# Patient Record
Sex: Female | Born: 1955 | Race: White | Hispanic: No | Marital: Married | Smoking: Never smoker
Health system: Southern US, Community
[De-identification: ages and names within clinical notes are randomized; demographics above are authoritative.]

## PROBLEM LIST (undated history)

## (undated) DIAGNOSIS — M549 Dorsalgia, unspecified: Secondary | ICD-10-CM

## (undated) DIAGNOSIS — R3129 Other microscopic hematuria: Secondary | ICD-10-CM

## (undated) DIAGNOSIS — E7800 Pure hypercholesterolemia, unspecified: Secondary | ICD-10-CM

## (undated) DIAGNOSIS — M2540 Effusion, unspecified joint: Secondary | ICD-10-CM

## (undated) DIAGNOSIS — E039 Hypothyroidism, unspecified: Secondary | ICD-10-CM

## (undated) DIAGNOSIS — F419 Anxiety disorder, unspecified: Secondary | ICD-10-CM

## (undated) DIAGNOSIS — M2550 Pain in unspecified joint: Secondary | ICD-10-CM

## (undated) DIAGNOSIS — I10 Essential (primary) hypertension: Secondary | ICD-10-CM

## (undated) DIAGNOSIS — M1990 Unspecified osteoarthritis, unspecified site: Secondary | ICD-10-CM

## (undated) DIAGNOSIS — F329 Major depressive disorder, single episode, unspecified: Secondary | ICD-10-CM

## (undated) DIAGNOSIS — K8020 Calculus of gallbladder without cholecystitis without obstruction: Secondary | ICD-10-CM

## (undated) DIAGNOSIS — G8929 Other chronic pain: Secondary | ICD-10-CM

## (undated) DIAGNOSIS — Z860100 Personal history of colon polyps, unspecified: Secondary | ICD-10-CM

## (undated) DIAGNOSIS — Z8619 Personal history of other infectious and parasitic diseases: Secondary | ICD-10-CM

## (undated) DIAGNOSIS — Z8489 Family history of other specified conditions: Secondary | ICD-10-CM

## (undated) DIAGNOSIS — F32A Depression, unspecified: Secondary | ICD-10-CM

## (undated) HISTORY — PX: TONSILLECTOMY: SUR1361

## (undated) HISTORY — PX: CARPAL TUNNEL RELEASE: SHX101

## (undated) HISTORY — PX: ABDOMINAL HYSTERECTOMY: SHX81

## (undated) HISTORY — DX: Essential (primary) hypertension: I10

## (undated) HISTORY — DX: Anxiety disorder, unspecified: F41.9

## (undated) HISTORY — DX: Calculus of gallbladder without cholecystitis without obstruction: K80.20

## (undated) HISTORY — PX: KNEE ARTHROSCOPY: SUR90

## (undated) HISTORY — PX: COLONOSCOPY: SHX174

## (undated) HISTORY — PX: NASAL SINUS SURGERY: SHX719

## (undated) HISTORY — PX: OTHER SURGICAL HISTORY: SHX169

## (undated) HISTORY — PX: BREAST BIOPSY: SHX20

## (undated) HISTORY — PX: APPENDECTOMY: SHX54

## (undated) HISTORY — PX: BUNIONECTOMY: SHX129

## (undated) HISTORY — PX: CHOLECYSTECTOMY: SHX55

---

## 1996-12-27 HISTORY — PX: ABDOMINAL HYSTERECTOMY: SHX81

## 2000-12-27 HISTORY — PX: TONSILLECTOMY AND ADENOIDECTOMY: SUR1326

## 2015-07-31 ENCOUNTER — Emergency Department (HOSPITAL_COMMUNITY): Payer: 59

## 2015-07-31 ENCOUNTER — Emergency Department (HOSPITAL_COMMUNITY)
Admission: EM | Admit: 2015-07-31 | Discharge: 2015-07-31 | Disposition: A | Discharge status: Home / Self Care | Payer: 59 | Attending: Emergency Medicine | Admitting: Emergency Medicine

## 2015-07-31 ENCOUNTER — Encounter (HOSPITAL_COMMUNITY): Payer: Self-pay

## 2015-07-31 DIAGNOSIS — Y9389 Activity, other specified: Secondary | ICD-10-CM | POA: Diagnosis not present

## 2015-07-31 DIAGNOSIS — E780 Pure hypercholesterolemia: Secondary | ICD-10-CM | POA: Insufficient documentation

## 2015-07-31 DIAGNOSIS — Z79899 Other long term (current) drug therapy: Secondary | ICD-10-CM | POA: Insufficient documentation

## 2015-07-31 DIAGNOSIS — M545 Low back pain, unspecified: Secondary | ICD-10-CM

## 2015-07-31 DIAGNOSIS — M549 Dorsalgia, unspecified: Secondary | ICD-10-CM

## 2015-07-31 DIAGNOSIS — S3992XA Unspecified injury of lower back, initial encounter: Secondary | ICD-10-CM | POA: Diagnosis not present

## 2015-07-31 DIAGNOSIS — Y9289 Other specified places as the place of occurrence of the external cause: Secondary | ICD-10-CM | POA: Diagnosis not present

## 2015-07-31 DIAGNOSIS — Z87440 Personal history of urinary (tract) infections: Secondary | ICD-10-CM | POA: Diagnosis not present

## 2015-07-31 DIAGNOSIS — Y998 Other external cause status: Secondary | ICD-10-CM | POA: Insufficient documentation

## 2015-07-31 DIAGNOSIS — X58XXXA Exposure to other specified factors, initial encounter: Secondary | ICD-10-CM | POA: Insufficient documentation

## 2015-07-31 HISTORY — DX: Pure hypercholesterolemia, unspecified: E78.00

## 2015-07-31 LAB — URINALYSIS, ROUTINE W REFLEX MICROSCOPIC
Bilirubin Urine: NEGATIVE
Glucose, UA: NEGATIVE mg/dL
Ketones, ur: NEGATIVE mg/dL
Leukocytes, UA: NEGATIVE
Nitrite: NEGATIVE
PROTEIN: NEGATIVE mg/dL
Specific Gravity, Urine: >1.03 — ABNORMAL HIGH (ref 1.005–1.030)
Urobilinogen, UA: 0.2 mg/dL (ref 0.0–1.0)
pH: 5.5 (ref 5.0–8.0)

## 2015-07-31 LAB — URINE MICROSCOPIC-ADD ON

## 2015-07-31 MED ORDER — OXYCODONE-ACETAMINOPHEN 5-325 MG PO TABS
2.0000 | ORAL_TABLET | Freq: Once | ORAL | Status: AC
  Administered 2015-07-31: 2 via ORAL
  Filled 2015-07-31: qty 2

## 2015-07-31 MED ORDER — METHOCARBAMOL 500 MG PO TABS: 1000.0000 mg | ORAL_TABLET | Freq: Four times a day (QID) | ORAL | Status: DC | PRN

## 2015-07-31 MED ORDER — NAPROXEN 250 MG PO TABS: 250.0000 mg | ORAL_TABLET | Freq: Two times a day (BID) | ORAL | Status: DC | PRN

## 2015-07-31 MED ORDER — DIAZEPAM 5 MG PO TABS
5.0000 mg | ORAL_TABLET | Freq: Once | ORAL | Status: AC
  Administered 2015-07-31: 5 mg via ORAL
  Filled 2015-07-31: qty 1

## 2015-07-31 MED ORDER — HYDROCODONE-ACETAMINOPHEN 5-325 MG PO TABS: ORAL_TABLET | ORAL | Status: DC

## 2015-07-31 NOTE — ED Notes (Signed)
Pt states she bent over Monday and felt something pull in the right side of her back. States the pain is going into her right groin/flank area

## 2015-07-31 NOTE — Discharge Instructions (Signed)
°Emergency Department Resource Guide °1) Find a Doctor and Pay Out of Pocket °Although you won't have to find out who is covered by your insurance plan, it is a good idea to ask around and get recommendations. You will then need to call the office and see if the doctor you have chosen will accept you as a new patient and what types of options they offer for patients who are self-pay. Some doctors offer discounts or will set up payment plans for their patients who do not have insurance, but you will need to ask so you aren't surprised when you get to your appointment. ° °2) Contact Your Local Health Department °Not all health departments have doctors that can see patients for sick visits, but many do, so it is worth a call to see if yours does. If you don't know where your local health department is, you can check in your phone book. The CDC also has a tool to help you locate your state's health department, and many state websites also have listings of all of their local health departments. ° °3) Find a Walk-in Clinic °If your illness is not likely to be very severe or complicated, you may want to try a walk in clinic. These are popping up all over the country in pharmacies, drugstores, and shopping centers. They're usually staffed by nurse practitioners or physician assistants that have been trained to treat common illnesses and complaints. They're usually fairly quick and inexpensive. However, if you have serious medical issues or chronic medical problems, these are probably not your best option. ° °No Primary Care Doctor: °- Call [facility] at  832-8000 - they can help you locate a primary care doctor that  accepts your insurance, provides certain services, etc. °- Physician Referral Service- 1-800-533-3463 ° °Chronic Pain Problems: °Organization         Address  Phone   Notes  °[facility]  (336) 297-2271 Patients need to be referred by their primary care doctor.  ° °Medication  Assistance: °Organization         Address  Phone   Notes  °[place] [facility] [address] °[place], [place] [address] (336) 641-8030 --Must be a resident of [place] °-- Must have NO insurance coverage whatsoever (no Medicaid/ Medicare, etc.) °-- The pt. MUST have a primary care doctor that directs their care regularly and follows them in the community °  °[facility]  (866) 331-1348   °[employer]  (888) 892-1162   ° °Agencies that provide inexpensive medical care: °Organization         Address  Phone   Notes  °[facility]  (336) 832-8035   ° Internal Medicine    (336) 832-7272   °[facility] [address] °[place], [place] [address] (336) 832-4777   °[facility] [address], °[place] (336) 271-4999   °[facility]    (336) 373-0678   °[facility]    (336) 272-1050   °[facility] ° [address], [place] Phone:  (336) 832-4444, Fax:  (336) 832-4440 Hours of Operation:  9 am - 6 pm, M-F.  Also accepts Medicaid/Medicare and self-pay.  °[facility] ° [address], [place] Phone: (336) 832-3150, Fax: (336) [phone]. Hours of Operation:  8:30 am - 5:30 pm, M-F.  Also accepts Medicaid and self-pay.  °[facility] [place] [address], [place] Phone: (336) 878-6027   °[facility] [address], [place], [place] (336)723-1848, Ext. 123 Mondays & Thursdays: 7-9 AM.  First 15 patients are seen on a first come, first serve basis. °  ° °Medicaid-accepting [place] Providers: ° °Organization         Address  Phone   Notes  °[facility] [address], [place] (336) 641-2100 Also accepts self-pay patients.  °[facility] [address], [place] ° (336) 856-9996   °[facility] [address], [place]  (336) 288-8857   °[facility] [address] [place] [address], [place] (336) 299-7000   °Veita Bland [address], [place]  ° (336) 373-1557 Only accepts [place] Access Medicaid patients after they have their name applied to their card.  ° °Self-Pay (no insurance) in [place]: ° °Organization         Address  Phone   Notes  °Sickle Cell Patients, [facility] [address], [place] (336) 832-1970   °[facility] [address], [place] (336) 832-4400   °[facility] ° [address], [place] (336) 992-4800   °[facility]/Dr. Osei-Bonsu ° [address] [place] [address], [place] or [address], [place] (336) 841-8500 Phone number for both [place] and [place] locations is the same.  °[facility] [address], [place] (336) 299-0000   °[facility] [address] [place] [address], [place] or [address] (336) 852-7530 °(336) 878-2260   °[facility] [address], [place] (336) 350-1642, phone; (336) 294-5005, fax Sees patients 1st and 3rd Saturday of every month.  Must not qualify for public or private insurance (i.e. Medicaid, Medicare, [place] Health Choice, Veterans' Benefits) • Household income should be no more than 200% of the poverty level •The clinic cannot treat you if you are pregnant or think you are pregnant • Sexually transmitted diseases are not treated at the clinic.  ° ° °Dental Care: °Organization         Address  Phone  Notes  °[place] [facility] [address], [place] (336) 641-6152 Accepts children up to age 21 who are enrolled in Medicaid or [place] Health Choice; pregnant women with a Medicaid card; and children who have applied for Medicaid or [place] Health Choice, but were declined, whose parents can pay a reduced fee at time of service.  °[place]  [facility] [place]  [address], [place] (336) 641-7733 Accepts children up to age 21 who are enrolled in Medicaid or [place] Health Choice; pregnant women with a Medicaid card; and children who have applied for Medicaid or  Health Choice, but were declined, whose parents can pay a reduced fee at time of service.  °[facility] ° [address],  (336) 641-4533 Patients are seen by appointment only. Walk-ins are not accepted. [facility] will see patients 18 years of age and older. °Monday - Tuesday (8am-5pm) °Most Wednesdays (8:30-5pm) °$30 per visit, cash only  °[facility] ° [address], [place] (336) 641-4533 Patients are seen by appointment only. Walk-ins are not accepted. [facility] will see patients 18 years of age and older. °One   Wednesday Evening (Monthly: Volunteer Based).  $30 per visit, cash only  °[facility]  (919) 537-3737 for adults; Children under age 4, call Graduate Pediatric Dentistry at (919) [phone]. Children aged 4-14, please call (919) [phone] to request a pediatric application. ° Dental services are provided in all areas of dental care including fillings, crowns and bridges, complete and partial dentures, implants, gum treatment, root canals, and extractions. Preventive care is also provided. Treatment is provided to both adults and children. °Patients are selected via a lottery and there is often a waiting list. °  °[facility] [address], °[place] ° (336) 763-8833 [email or web address] °  °[facility] [address], [place], [place] (336)723-1848, Ext. 123 Second and Fourth Thursday of each month, opens at 6:30 AM; Clinic ends at 9 AM.  Patients are seen on a first-come first-served basis, and a limited number are seen during each clinic.  ° °[facility] ° [address], [place], [place] (336) 723-7904    Eligibility Requirements °You must have lived in [place], [place], or [place] counties for at least the last three months. °  You cannot be eligible for state or federal sponsored healthcare insurance, including Veterans Administration, Medicaid, or Medicare. °  You generally cannot be eligible for healthcare insurance through your employer.  °  How to apply: °Eligibility screenings are held every Tuesday and Wednesday afternoon from 1:00 pm until 4:00 pm. You do not need an appointment for the interview!  °[facility] [address], [place], [place] 336-631-2330   °[place] [facility]  336-342-8273   °[facility]  336-703-3100   °[facility]  336-570-6415   ° °Behavioral Health Resources in the Community: °Intensive Outpatient Programs °Organization         Address  Phone  Notes  °[facility] [address], [place], [place] 336-878-6098   °[facility] [address], [place], [place] 336-832-9800   °[facility] [address], [place], [place] ° 336-882-2125   °[facility] [address],  °[place], [place] 1-800-853-5163 or 336-641-4981   °Substance Abuse Resources °Organization         Address  Phone  Notes  °[facility]  336-882-2125   °[facility]  336-784-9470   °[facility]  336-285-9073   °[facility]  336-845-3988   °Residential & Outpatient Substance Abuse Program  1-800-659-3381   °Psychological Services °Organization         Address  Phone  Notes  °[facility]  336- 832-9600   °[facility]  336- 378-7881   °[facility] [address], [place] 1-800-853-5163 or 336-641-4981   ° °Mobile Crisis Teams °Organization         Address  Phone  Notes  °[facility], Mobile Crisis Care Unit  1-877-626-1772   °Assertive °[facility] ° [address]  [place], [place] 336-834-9664   °Sharon DeEsch [address] °[place] 336-554-5454   ° °Self-Help/Support Groups °Organization         Address  Phone             Notes  °[facility] - variety of support groups  336- 373-1402 Call for more information  °Narcotics Anonymous (NA), [facility] [address], °[place]  2 meetings at this location  ° °  Residential Treatment Programs °Organization         Address  Phone  Notes  °[facility] [address],    °[place]  1-866-801-8205   °[facility] ° [address], [place], [place] 704-293-8524   °[facility] [address], [place] 336-845-3988 Admissions: 8am-3pm M-F  °[facility] [address]    °[place], [place] 336-841-1104   °[facility] [address], [place], [place] 336-379-7146   °[facility] [address]  °[place], [place] 336-285-9073   °[facility] - Intensive Outpatient [address], [place], [place] 336-852-3033   °[facility] [address]  °[place], [place] 1-877-615-2722 or 336-784-9470   °[facility] [address] [place], [place] 336-227-7417 Accepts Medicaid  °[facility] [address]  °[place] 1-800-659-3381 Substance Abuse/Addiction Treatment  ° °[place] Behavioral Health Resources °Organization         Address  Phone  Notes  °[facility]  (888) 581-9988   °Julie Brannon, PhD [address], [place]   (336) 349-5553 or (336) 951-0000   ° Behavioral   [address] °[place], [place] (336) 349-4454   °[facility] [address], [place], [place] (336) 342-8316 Insurance/Medicaid/sponsorship through [facility]  °[facility] [address]                                    [place], [place] (336) 342-8316 Therapy/tele-psych/case    °[facility] [address]  ° [place], [place] (336) 349-2233    °Dr. Arfeen  (336) 349-4544   °[facility] [place]  [employer] [place] [facility] 1) [address], [place] °2) [address], [place] °3)  [address], [place] (336) 349-3220 °(336) 342-7768 ° °(336) 342-8140   °[place] [facility] (336) 342-1394 or (336) 342-3537 (After Hours)    ° ° °Take the prescriptions as directed.  Apply moist heat or ice to the area(s) of discomfort, for 15 minutes at a time, several times per day for the next few days.  Do not fall asleep on a heating or ice pack.  Call your regular medical doctor today to schedule a follow up appointment in the next 2 days.  Return to the Emergency Department immediately if worsening. ° °

## 2015-07-31 NOTE — ED Provider Notes (Signed)
CSN: [id number]     Arrival date & time 07/31/15  1116 History   First MD Initiated Contact with Patient 07/31/15 1212     Chief Complaint  Patient presents with  . Back Pain      HPI Pt was seen at 1215. Per pt, c/o sudden onset and persistence of constant right sided low back "pain" that began 3 days ago. States she "bent over" and "felt something pull" in the right side of her lower back.  Pt describes the pain as "sharp," with radiation into the right side of her abd. Pain worsens with palpation of the area and body position changes. Denies dysuria/hematuria, no abd pain, no N/V, no diarrhea, no black or blood in emesis, no CP/SOB.  Denies incont/retention of bowel or bladder, no saddle anesthesia, no focal motor weakness, no tingling/numbness in extremities, no fevers, no direct injury.     Past Medical History  Diagnosis Date  . Thyroid disease   . High cholesterol   . UTI (urinary tract infection)    Past Surgical History  Procedure Laterality Date  . Appendectomy    . Abdominal hysterectomy    . Carpal tunnel release    . Tonsillectomy      History  Substance Use Topics  . Smoking status: Never Smoker   . Smokeless tobacco: Not on file  . Alcohol Use: Yes    Review of Systems ROS: Statement: All systems negative except as marked or noted in the HPI; Constitutional: Negative for fever and chills. ; ; Eyes: Negative for eye pain, redness and discharge. ; ; ENMT: Negative for ear pain, hoarseness, nasal congestion, sinus pressure and sore throat. ; ; Cardiovascular: Negative for chest pain, palpitations, diaphoresis, dyspnea and peripheral edema. ; ; Respiratory: Negative for cough, wheezing and stridor. ; ; Gastrointestinal: Negative for nausea, vomiting, diarrhea, abdominal pain, blood in stool, hematemesis, jaundice and rectal bleeding. . ; ; Genitourinary: Negative for dysuria, flank pain and hematuria. ; ; Musculoskeletal: +LBP. Negative for neck pain. Negative for  swelling and direct trauma.; ; Skin: Negative for pruritus, rash, abrasions, blisters, bruising and skin lesion.; ; Neuro: Negative for headache, lightheadedness and neck stiffness. Negative for weakness, altered level of consciousness , altered mental status, extremity weakness, paresthesias, involuntary movement, seizure and syncope.      Allergies  Review of patient's allergies indicates no known allergies.  Home Medications   Prior to Admission medications   Medication Sig Start Date End Date Taking? Authorizing Provider  escitalopram (LEXAPRO) 10 MG tablet Take 10 mg by mouth daily.   Yes Historical Provider, MD  ibuprofen (ADVIL,MOTRIN) 200 MG tablet Take 400 mg by mouth every 4 (four) hours as needed for moderate pain.   Yes Historical Provider, MD  levothyroxine (SYNTHROID, LEVOTHROID) 112 MCG tablet Take 112 mcg by mouth daily before breakfast.   Yes Historical Provider, MD  rosuvastatin (CRESTOR) 20 MG tablet Take 20 mg by mouth daily.   Yes Historical Provider, MD   BP 129/80 mmHg  Pulse 73  Temp(Src) 97.7 F (36.5 C) (Oral)  Resp 20  Ht 5' 6.5" (1.689 m)  Wt 198 lb (89.812 kg)  BMI 31.48 kg/m2  SpO2 98% Physical Exam  1220: Physical examination:  Nursing notes reviewed; Vital signs and O2 SAT reviewed;  Constitutional: Well developed, Well nourished, Well hydrated, In no acute distress; Head:  Normocephalic, atraumatic; Eyes: EOMI, PERRL, No scleral icterus; ENMT: Mouth and pharynx normal, Mucous membranes moist; Neck: Supple, Full range of motion,  No lymphadenopathy; Cardiovascular: Regular rate and rhythm, No murmur, rub, or gallop; Respiratory: Breath sounds clear & equal bilaterally, No rales, rhonchi, wheezes.  Speaking full sentences with ease, Normal respiratory effort/excursion; Chest: Nontender, Movement normal; Abdomen: Soft, Nontender, Nondistended, Normal bowel sounds; Genitourinary: No CVA tenderness; Spine:  No midline CS, TS, LS tenderness. +TTP right lumbar  paraspinal muscles.;; Extremities: Pulses normal, No tenderness, No edema, No calf edema or asymmetry.; Neuro: AA&Ox3, Major CN grossly intact.  Speech clear. No gross focal motor or sensory deficits in extremities. Strength 5/5 equal bilat UE's and LE's, including great toe dorsiflexion.  DTR 2/4 equal bilat UE's and LE's.  No gross sensory deficits.  Neg straight leg raises bilat. Climbs on and off stretcher easily by herself. Gait steady.; Skin: Color normal, Warm, Dry.   ED Course  Procedures     EKG Interpretation None      MDM  MDM Reviewed: previous chart, nursing note and vitals Interpretation: labs and CT scan      Results for orders placed or performed during the hospital encounter of 07/31/15  Urinalysis, Routine w reflex microscopic (not at [facility])  Result Value Ref Range   Color, Urine YELLOW YELLOW   APPearance CLEAR CLEAR   Specific Gravity, Urine >1.030 (H) 1.005 - 1.030   pH 5.5 5.0 - 8.0   Glucose, UA NEGATIVE NEGATIVE mg/dL   Hgb urine dipstick SMALL (A) NEGATIVE   Bilirubin Urine NEGATIVE NEGATIVE   Ketones, ur NEGATIVE NEGATIVE mg/dL   Protein, ur NEGATIVE NEGATIVE mg/dL   Urobilinogen, UA 0.2 0.0 - 1.0 mg/dL   Nitrite NEGATIVE NEGATIVE   Leukocytes, UA NEGATIVE NEGATIVE  Urine microscopic-add on  Result Value Ref Range   Squamous Epithelial / LPF FEW (A) RARE   WBC, UA 0-2 <3 WBC/hpf   RBC / HPF 3-6 <3 RBC/hpf   Bacteria, UA RARE RARE   Ct Renal Stone Study 07/31/2015   CLINICAL DATA:  Right rolling/flank pain after bending 3 days prior  EXAM: CT ABDOMEN AND PELVIS WITHOUT CONTRAST  TECHNIQUE: Multidetector CT imaging of the abdomen and pelvis was performed following the standard protocol without oral or intravenous contrast material administration.  COMPARISON:  None.  FINDINGS: Lung bases are clear.  Liver is prominent, measuring 18.4 cm in length. No focal liver lesions are identified on this noncontrast enhanced study. Gallbladder is absent. There is  no appreciable biliary duct dilatation.  Spleen, pancreas, and adrenals appear normal.  Kidneys bilaterally show no mass or hydronephrosis on either side. There is no renal or ureteral calculus on either side.  In the pelvis, there are several small phleboliths. There is a tiny calcification just to the right of midline which is immediately adjacent to the superior aspect of the urinary bladder but is not felt to reside within the bladder. The bladder is partially decompressed. The thickness of the bladder wall it is within normal limits. Uterus is absent. Appendix is absent. There is no pelvic mass or pelvic fluid collection.  There is lipomatous infiltration of the ileocecal valve.  There is no bowel obstruction. No free air or portal venous air. There is no ascites, adenopathy, or abscess in the abdomen or pelvis. There is no abdominal aortic aneurysm. There is degenerative change in the lumbar spine. There are no blastic or lytic bone lesions.  IMPRESSION: There is no appreciable renal or ureteral calculus. No hydronephrosis.  No bowel obstruction. No abscess. No bowel wall or mesenteric thickening.  Uterus, gallbladder, and appendix are  absent.  Liver prominent without focal lesion on this noncontrast enhanced study.   Electronically Signed   By: Lowella Grip III M.D.   On: 07/31/2015 13:10    1345:  Workup reassuring. Feels better after meds and wants to go home now. Tx symptomatically at this time. Dx and testing d/w pt and family.  Questions answered.  Verb understanding, agreeable to d/c home with outpt f/u.   Francine Graven, DO 08/02/15 2336

## 2015-11-25 ENCOUNTER — Encounter: Payer: Self-pay | Admitting: Family Medicine

## 2015-11-27 LAB — HM PAP SMEAR: HM Pap smear: NEGATIVE

## 2016-09-29 ENCOUNTER — Other Ambulatory Visit: Payer: Self-pay | Admitting: Orthopaedic Surgery

## 2016-10-01 NOTE — Pre-Procedure Instructions (Signed)
CORLIS STFLEUR  10/01/2016      [place], [place] [address] Phone: [phone] Fax: [phone]    Your procedure is scheduled on Tues, Oct 17 @ 10:30 AM  Report to [facility] Admitting at 8:30 AM  Call this number if you have problems the morning of surgery:  [phone]   Remember:  Do not eat food or drink liquids after midnight.  Take these medicines the morning of surgery with A SIP OF WATER Lexapro(Escitalopram) and Pain Pill(if needed)              Stop taking your Ibuprofen and Meloxicam a week prior to surgery. No Goody's,BC's,Aleve,Advil,Motrin,or Fish Oil.    Do not wear jewelry, make-up or nail polish.  Do not wear lotions, powders,perfumes, or deoderant.  Do not shave 48 hours prior to surgery.    Do not bring valuables to the hospital.  [facility] is not responsible for any belongings or valuables.  Contacts, dentures or bridgework may not be worn into surgery.  Leave your suitcase in the car.  After surgery it may be brought to your room.  For patients admitted to the hospital, discharge time will be determined by your treatment team.  Patients discharged the day of surgery will not be allowed to drive home.   [facility]  Before surgery, you can play an important role.  Because skin is not sterile, your skin needs to be as free of germs as possible.  You can reduce the number of germs on you skin by washing with CHG (chlorahexidine gluconate) soap before surgery.  CHG is an antiseptic cleaner which kills germs and bonds with the skin to continue killing germs even after washing.  Please DO NOT use if you have an allergy to CHG or antibacterial soaps.  If your skin becomes reddened/irritated stop using the CHG and inform your nurse when you arrive at Short Stay.  Do not shave (including legs and underarms) for at least 48 hours  prior to the first CHG shower.  You may shave your face.  Please follow these instructions carefully:   1.  Shower with CHG Soap the night before surgery and the                                morning of Surgery.  2.  If you choose to wash your hair, wash your hair first as usual with your       normal shampoo.  3.  After you shampoo, rinse your hair and body thoroughly to remove the                      Shampoo.  4.  Use CHG as you would any other liquid soap.  You can apply chg directly       to the skin and wash gently with scrungie or a clean washcloth.  5.  Apply the CHG Soap to your body ONLY FROM THE NECK DOWN.        Do not use on open wounds or open sores.  Avoid contact with your eyes,       ears, mouth and genitals (private parts).  Wash genitals (private parts)       with your normal soap.  6.  Wash thoroughly, paying special attention  to the area where your surgery        will be performed.  7.  Thoroughly rinse your body with warm water from the neck down.  8.  DO NOT shower/wash with your normal soap after using and rinsing off       the CHG Soap.  9.  Pat yourself dry with a clean towel.            10.  Wear clean pajamas.            11.  Place clean sheets on your bed the night of your first shower and do not        sleep with pets.  Day of Surgery  Do not apply any lotions/deoderants the morning of surgery.  Please wear clean clothes to the hospital/surgery center.    Please read over the following fact sheets that you were given. Pain Booklet, Coughing and Deep Breathing, MRSA Information and Surgical Site Infection Prevention

## 2016-10-04 ENCOUNTER — Ambulatory Visit (HOSPITAL_COMMUNITY)
Admission: RE | Admit: 2016-10-04 | Discharge: 2016-10-04 | Disposition: A | Discharge status: Home / Self Care | Payer: 59 | Source: Ambulatory Visit | Attending: Orthopaedic Surgery | Admitting: Orthopaedic Surgery

## 2016-10-04 ENCOUNTER — Encounter (HOSPITAL_COMMUNITY): Payer: Self-pay

## 2016-10-04 ENCOUNTER — Encounter (HOSPITAL_COMMUNITY)
Admission: RE | Admit: 2016-10-04 | Discharge: 2016-10-04 | Disposition: A | Discharge status: Home / Self Care | Payer: 59 | Source: Ambulatory Visit | Attending: Orthopaedic Surgery | Admitting: Orthopaedic Surgery

## 2016-10-04 DIAGNOSIS — Z01818 Encounter for other preprocedural examination: Secondary | ICD-10-CM

## 2016-10-04 DIAGNOSIS — E7800 Pure hypercholesterolemia, unspecified: Secondary | ICD-10-CM | POA: Insufficient documentation

## 2016-10-04 DIAGNOSIS — M170 Bilateral primary osteoarthritis of knee: Secondary | ICD-10-CM | POA: Insufficient documentation

## 2016-10-04 DIAGNOSIS — Z01812 Encounter for preprocedural laboratory examination: Secondary | ICD-10-CM | POA: Diagnosis not present

## 2016-10-04 DIAGNOSIS — F329 Major depressive disorder, single episode, unspecified: Secondary | ICD-10-CM | POA: Insufficient documentation

## 2016-10-04 DIAGNOSIS — Z79899 Other long term (current) drug therapy: Secondary | ICD-10-CM | POA: Insufficient documentation

## 2016-10-04 DIAGNOSIS — I700 Atherosclerosis of aorta: Secondary | ICD-10-CM | POA: Insufficient documentation

## 2016-10-04 DIAGNOSIS — E039 Hypothyroidism, unspecified: Secondary | ICD-10-CM | POA: Diagnosis not present

## 2016-10-04 DIAGNOSIS — Z0183 Encounter for blood typing: Secondary | ICD-10-CM | POA: Insufficient documentation

## 2016-10-04 HISTORY — DX: Major depressive disorder, single episode, unspecified: F32.9

## 2016-10-04 HISTORY — DX: Pain in unspecified joint: M25.50

## 2016-10-04 HISTORY — DX: Other microscopic hematuria: R31.29

## 2016-10-04 HISTORY — DX: Other chronic pain: G89.29

## 2016-10-04 HISTORY — DX: Personal history of colon polyps, unspecified: Z86.0100

## 2016-10-04 HISTORY — DX: Hypothyroidism, unspecified: E03.9

## 2016-10-04 HISTORY — DX: Depression, unspecified: F32.A

## 2016-10-04 HISTORY — DX: Dorsalgia, unspecified: M54.9

## 2016-10-04 HISTORY — DX: Personal history of other infectious and parasitic diseases: Z86.19

## 2016-10-04 HISTORY — DX: Family history of other specified conditions: Z84.89

## 2016-10-04 HISTORY — DX: Unspecified osteoarthritis, unspecified site: M19.90

## 2016-10-04 HISTORY — DX: Effusion, unspecified joint: M25.40

## 2016-10-04 HISTORY — DX: Personal history of colonic polyps: Z86.010

## 2016-10-04 LAB — CBC WITH DIFFERENTIAL/PLATELET
BASOS ABS: 0.1 10*3/uL (ref 0.0–0.1)
Basophils Relative: 1 %
EOS ABS: 1.5 10*3/uL — AB (ref 0.0–0.7)
Eosinophils Relative: 27 %
HCT: 38.7 % (ref 36.0–46.0)
HEMOGLOBIN: 12.9 g/dL (ref 12.0–15.0)
LYMPHS PCT: 33 %
Lymphs Abs: 1.8 10*3/uL (ref 0.7–4.0)
MCH: 29.9 pg (ref 26.0–34.0)
MCHC: 33.3 g/dL (ref 30.0–36.0)
MCV: 89.8 fL (ref 78.0–100.0)
Monocytes Absolute: 0.4 10*3/uL (ref 0.1–1.0)
Monocytes Relative: 7 %
NEUTROS PCT: 32 %
Neutro Abs: 1.7 10*3/uL (ref 1.7–7.7)
Platelets: 214 10*3/uL (ref 150–400)
RBC: 4.31 MIL/uL (ref 3.87–5.11)
RDW: 12.7 % (ref 11.5–15.5)
WBC: 5.5 10*3/uL (ref 4.0–10.5)

## 2016-10-04 LAB — URINALYSIS, ROUTINE W REFLEX MICROSCOPIC
BILIRUBIN URINE: NEGATIVE
Glucose, UA: NEGATIVE mg/dL
Hgb urine dipstick: NEGATIVE
KETONES UR: NEGATIVE mg/dL
Leukocytes, UA: NEGATIVE
NITRITE: NEGATIVE
PH: 7 (ref 5.0–8.0)
PROTEIN: NEGATIVE mg/dL
Specific Gravity, Urine: 1.024 (ref 1.005–1.030)

## 2016-10-04 LAB — BASIC METABOLIC PANEL
ANION GAP: 8 (ref 5–15)
BUN: 14 mg/dL (ref 6–20)
CALCIUM: 9 mg/dL (ref 8.9–10.3)
CO2: 26 mmol/L (ref 22–32)
Chloride: 108 mmol/L (ref 101–111)
Creatinine, Ser: 0.68 mg/dL (ref 0.44–1.00)
Glucose, Bld: 96 mg/dL (ref 65–99)
POTASSIUM: 3.9 mmol/L (ref 3.5–5.1)
Sodium: 142 mmol/L (ref 135–145)

## 2016-10-04 LAB — PROTIME-INR
INR: 0.95
PROTHROMBIN TIME: 12.6 s (ref 11.4–15.2)

## 2016-10-04 LAB — APTT: APTT: 30 s (ref 24–36)

## 2016-10-04 LAB — SURGICAL PCR SCREEN
MRSA, PCR: NEGATIVE
STAPHYLOCOCCUS AUREUS: NEGATIVE

## 2016-10-04 LAB — ABO/RH: ABO/RH(D): A POS

## 2016-10-04 LAB — PREPARE RBC (CROSSMATCH)

## 2016-10-04 MED ORDER — CHLORHEXIDINE GLUCONATE 4 % EX LIQD: 60.0000 mL | Freq: Once | CUTANEOUS | Status: DC

## 2016-10-04 NOTE — Progress Notes (Addendum)
Cardiologist denies  Medical Md is Dr.James Milam  Echo denies  Stress test done several yrs ago. States was done as part of a physical   Heart cath denies  EKG to be requested from [place] denies in past yr

## 2016-10-05 NOTE — Progress Notes (Signed)
Anesthesia chart review: Patient is a 60 year old female scheduled for bilateral TKA on 10/12/2016 by Dr. Rhona Raider.  History includes never smoker, hypercholesterolemia, hypothyroidism, depression, microscopic hematuria, chronic back pain, arthritis, appendectomy, hysterectomy, tonsillectomy, cholecystectomy, nasal sinus surgery.  PCP is Dr. Darlina Sicilian in [place], [place] ([place]), last visit 08/04/16 for CPE.  Meds include Lexapro, levothyroxine, Crestor.  BP (!) 142/79   Pulse 76   Temp 36.7 C   Resp 20   Ht 5' 5.5" (1.664 m)   Wt 198 lb (89.8 kg)   SpO2 96%   BMI 32.45 kg/m   08/04/16 EKG (Dr. Vista Deck; done as part of CPE visit): SB at 74 bpm, possible anterior infarct (age undetermined), lateral T wave changes are non-specific. He wrote, no changes, although I do not currently have a comparison EKG tracing.  She thought she had a stress test several years ago. According to Dr. Daralene Milch office note, patient had an echocardiogram done on 03/19/13 (Dr. Tarri Fuller) that showed: "Left ventricle: The cavity size was normal. Wall thickness was mildly increased. Systolic function was normal. The estimated ejection fraction was in the range of 55-65%. Right ventricle: Systolic function was normal. Descending aorta: The ascending aorta was mildly dilated. Aortic valve: Trileaflet. Cusp separation was normal. There was no stenosis. Mitral valve: Leaflet separation was normal. Trivial regurgitation."  10/04/16 CXR: FINDINGS: The lungs are adequately inflated and clear. The heart and pulmonary vascularity are normal. The mediastinum is normal in width. There is calcification in the wall of the aortic arch. There is mild multilevel degenerative disc disease of the thoracic spine. IMPRESSION: There is no active cardiopulmonary disease. Aortic atherosclerosis.  Preoperative labs noted.   If no acute changes then it is anticipated that she can proceed as planned. Reviewed records with  anesthesiologist Dr. Gifford Shave.  [facility] Short Stay Center/Anesthesiology Phone [phone] 10/05/2016 5:45 PM

## 2016-10-11 NOTE — H&P (Signed)
TOTAL KNEE ADMISSION H&P  Patient is being admitted for bilaterally total knee arthroplasty.  Subjective:  Chief Complaint:bilaterally knee pain.  HPI: Martha Orr, 60 y.o. female, has a history of pain and functional disability in the bilaterally knee due to arthritis and has failed non-surgical conservative treatments for greater than 12 weeks to includeNSAID's and/or analgesics, corticosteriod injections, viscosupplementation injections, flexibility and strengthening excercises, supervised PT with diminished ADL's post treatment, use of assistive devices, weight reduction as appropriate and activity modification.  Onset of symptoms was gradual, starting 5 years ago with gradually worsening course since that time. The patient noted prior procedures on the knee to include  arthroscopy on the right knee(s).  Patient currently rates pain in the bilaterally knee(s) at 10 out of 10 with activity. Patient has night pain, worsening of pain with activity and weight bearing, pain that interferes with activities of daily living, crepitus and joint swelling.  Patient has evidence of subchondral cysts, subchondral sclerosis, periarticular osteophytes and joint space narrowing by imaging studies. There is no active infection.  There are no active problems to display for this patient.  Past Medical History:  Diagnosis Date  . Arthritis   . Chronic back pain    buldging disc  . Depression    takes Lexapro daily  . Family history of adverse reaction to anesthesia    sister gets sick after anesthesia  . High cholesterol    takes Crestor daily  . History of colon polyps    benign  . History of shingles   . Hypothyroidism    takes Synthroid daily  . Joint pain   . Joint swelling   . Microscopic hematuria    states her entire life and family is the same way    Past Surgical History:  Procedure Laterality Date  . ABDOMINAL HYSTERECTOMY    . APPENDECTOMY    . BUNIONECTOMY Bilateral   . CARPAL  TUNNEL RELEASE Bilateral   . CHOLECYSTECTOMY    . COLONOSCOPY    . KNEE ARTHROSCOPY Right   . knot removed from right breast    . NASAL SINUS SURGERY    . TONSILLECTOMY      No prescriptions prior to admission.   Allergies  Allergen Reactions  . No Known Allergies     Social History  Substance Use Topics  . Smoking status: Never Smoker  . Smokeless tobacco: Never Used  . Alcohol use Yes     Comment: occasionally    No family history on file.   Review of Systems  Musculoskeletal: Positive for joint pain.       Bilateral knees  All other systems reviewed and are negative.   Objective:  Physical Exam  Constitutional: She is oriented to person, place, and time. She appears well-developed and well-nourished.  HENT:  Head: Normocephalic and atraumatic.  Eyes: Pupils are equal, round, and reactive to light.  Neck: Normal range of motion.  Cardiovascular: Normal rate and regular rhythm.   Respiratory: Effort normal.  GI: Soft.  Musculoskeletal:  Both knees move about 5-120.  She has pain at extremes.  Most of the pain is along the medial joint line right greater than left.  There is some crepitation towards full extension.  Hip motion is full and straight leg raise is negative bilaterally.  There is no palpable lymphadenopathy behind either knee.  Sensation and motor function are intact in her feet with palpable pulses on both sides.   Neurological: She is alert and oriented  to person, place, and time.  Skin: Skin is warm and dry.  Psychiatric: She has a normal mood and affect. Her behavior is normal. Judgment and thought content normal.    Vital signs in last 24 hours:    Labs:   Estimated body mass index is 32.45 kg/m as calculated from the following:   Height as of 10/04/16: 5' 5.5" (1.664 m).   Weight as of 10/04/16: 89.8 kg (198 lb).   Imaging Review Plain radiographs demonstrate severe degenerative joint disease of the bilaterally knee(s). The overall  alignment isneutral. The bone quality appears to be good for age and reported activity level.  Assessment/Plan:  End stage primary arthritis, bilaterally knee   The patient history, physical examination, clinical judgment of the provider and imaging studies are consistent with end stage degenerative joint disease of the bilaterally knee(s) and total knee arthroplasty is deemed medically necessary. The treatment options including medical management, injection therapy arthroscopy and arthroplasty were discussed at length. The risks and benefits of total knee arthroplasty were presented and reviewed. The risks due to aseptic loosening, infection, stiffness, patella tracking problems, thromboembolic complications and other imponderables were discussed. The patient acknowledged the explanation, agreed to proceed with the plan and consent was signed. Patient is being admitted for inpatient treatment for surgery, pain control, PT, OT, prophylactic antibiotics, VTE prophylaxis, progressive ambulation and ADL's and discharge planning. The patient is planning to be discharged home with home health services

## 2016-10-12 ENCOUNTER — Inpatient Hospital Stay (HOSPITAL_COMMUNITY): Payer: 59 | Admitting: Anesthesiology

## 2016-10-12 ENCOUNTER — Inpatient Hospital Stay (HOSPITAL_COMMUNITY): Payer: 59 | Admitting: Vascular Surgery

## 2016-10-12 ENCOUNTER — Encounter (HOSPITAL_COMMUNITY)
Admission: RE | Disposition: A | Discharge status: Home Health | Payer: Self-pay | Source: Ambulatory Visit | Attending: Orthopaedic Surgery

## 2016-10-12 ENCOUNTER — Inpatient Hospital Stay (HOSPITAL_COMMUNITY)
Admission: RE | Admit: 2016-10-12 | Discharge: 2016-10-18 | DRG: 462 | Disposition: A | Discharge status: Home Health | Payer: 59 | Source: Ambulatory Visit | Attending: Orthopaedic Surgery | Admitting: Orthopaedic Surgery

## 2016-10-12 ENCOUNTER — Encounter (HOSPITAL_COMMUNITY): Payer: Self-pay | Admitting: Anesthesiology

## 2016-10-12 DIAGNOSIS — E669 Obesity, unspecified: Secondary | ICD-10-CM | POA: Diagnosis present

## 2016-10-12 DIAGNOSIS — Z79899 Other long term (current) drug therapy: Secondary | ICD-10-CM

## 2016-10-12 DIAGNOSIS — M5134 Other intervertebral disc degeneration, thoracic region: Secondary | ICD-10-CM | POA: Diagnosis present

## 2016-10-12 DIAGNOSIS — R6889 Other general symptoms and signs: Secondary | ICD-10-CM

## 2016-10-12 DIAGNOSIS — M170 Bilateral primary osteoarthritis of knee: Secondary | ICD-10-CM | POA: Diagnosis present

## 2016-10-12 DIAGNOSIS — F329 Major depressive disorder, single episode, unspecified: Secondary | ICD-10-CM | POA: Diagnosis present

## 2016-10-12 DIAGNOSIS — Z86010 Personal history of colonic polyps: Secondary | ICD-10-CM

## 2016-10-12 DIAGNOSIS — E7800 Pure hypercholesterolemia, unspecified: Secondary | ICD-10-CM | POA: Diagnosis present

## 2016-10-12 DIAGNOSIS — E039 Hypothyroidism, unspecified: Secondary | ICD-10-CM | POA: Diagnosis present

## 2016-10-12 DIAGNOSIS — Z6832 Body mass index (BMI) 32.0-32.9, adult: Secondary | ICD-10-CM

## 2016-10-12 HISTORY — PX: TOTAL KNEE ARTHROPLASTY: SHX125

## 2016-10-12 SURGERY — ARTHROPLASTY, KNEE, BILATERAL, TOTAL
Anesthesia: Spinal | Laterality: Bilateral

## 2016-10-12 MED ORDER — OXYCODONE HCL 5 MG PO TABS
5.0000 mg | ORAL_TABLET | ORAL | Status: DC | PRN
Start: 2016-10-12 — End: 2016-10-13
  Administered 2016-10-12: 15 mg via ORAL
  Administered 2016-10-13: 10 mg via ORAL
  Administered 2016-10-13: 15 mg via ORAL
  Filled 2016-10-12: qty 3
  Filled 2016-10-12: qty 2
  Filled 2016-10-12: qty 3

## 2016-10-12 MED ORDER — GLYCOPYRROLATE 0.2 MG/ML IV SOSY
PREFILLED_SYRINGE | INTRAVENOUS | Status: AC
  Filled 2016-10-12: qty 3

## 2016-10-12 MED ORDER — TETRACAINE HCL 1 % IJ SOLN
INTRAMUSCULAR | Status: DC | PRN
  Administered 2016-10-12: 10 mg via INTRASPINAL

## 2016-10-12 MED ORDER — PROPOFOL 10 MG/ML IV BOLUS
INTRAVENOUS | Status: AC
  Filled 2016-10-12: qty 20

## 2016-10-12 MED ORDER — PHENYLEPHRINE HCL 10 MG/ML IJ SOLN
INTRAVENOUS | Status: DC | PRN
  Administered 2016-10-12: 25 ug/min via INTRAVENOUS

## 2016-10-12 MED ORDER — HYDROMORPHONE HCL 2 MG/ML IJ SOLN
INTRAMUSCULAR | Status: AC
  Administered 2016-10-12: 0.5 mg
  Filled 2016-10-12: qty 1

## 2016-10-12 MED ORDER — TRANEXAMIC ACID 1000 MG/10ML IV SOLN
2000.0000 mg | INTRAVENOUS | Status: AC
  Administered 2016-10-12: 2000 mg via TOPICAL
  Filled 2016-10-12: qty 20

## 2016-10-12 MED ORDER — LABETALOL HCL 5 MG/ML IV SOLN
INTRAVENOUS | Status: AC
  Filled 2016-10-12: qty 4

## 2016-10-12 MED ORDER — LABETALOL HCL 5 MG/ML IV SOLN
INTRAVENOUS | Status: DC | PRN
  Administered 2016-10-12: 5 mg via INTRAVENOUS

## 2016-10-12 MED ORDER — PROPOFOL 1000 MG/100ML IV EMUL
INTRAVENOUS | Status: AC
  Filled 2016-10-12: qty 100

## 2016-10-12 MED ORDER — ONDANSETRON HCL 4 MG/2ML IJ SOLN
INTRAMUSCULAR | Status: AC
  Filled 2016-10-12: qty 2

## 2016-10-12 MED ORDER — HYDROMORPHONE HCL 1 MG/ML IJ SOLN
INTRAMUSCULAR | Status: AC
  Filled 2016-10-12: qty 1

## 2016-10-12 MED ORDER — METOPROLOL TARTRATE 5 MG/5ML IV SOLN
INTRAVENOUS | Status: AC
  Filled 2016-10-12: qty 5

## 2016-10-12 MED ORDER — METOCLOPRAMIDE HCL 5 MG/ML IJ SOLN: 10.0000 mg | Freq: Once | INTRAMUSCULAR | Status: DC | PRN

## 2016-10-12 MED ORDER — LACTATED RINGERS IV SOLN
INTRAVENOUS | Status: DC
  Administered 2016-10-14: 12:00:00 via INTRAVENOUS

## 2016-10-12 MED ORDER — BUPIVACAINE LIPOSOME 1.3 % IJ SUSP
20.0000 mL | INTRAMUSCULAR | Status: AC
  Administered 2016-10-12: 10 mL
  Filled 2016-10-12: qty 20

## 2016-10-12 MED ORDER — PROPOFOL 500 MG/50ML IV EMUL
INTRAVENOUS | Status: DC | PRN
  Administered 2016-10-12: 25 ug/kg/min via INTRAVENOUS

## 2016-10-12 MED ORDER — METOPROLOL TARTARATE 1 MG/ML SYRINGE (5ML)
Status: DC | PRN
  Administered 2016-10-12: 1 mg via INTRAVENOUS
  Administered 2016-10-12: 2 mg via INTRAVENOUS

## 2016-10-12 MED ORDER — GLYCOPYRROLATE 0.2 MG/ML IJ SOLN
INTRAMUSCULAR | Status: DC | PRN
  Administered 2016-10-12 (×2): 0.1 mg via INTRAVENOUS

## 2016-10-12 MED ORDER — HYDROMORPHONE HCL 1 MG/ML IJ SOLN
0.2500 mg | INTRAMUSCULAR | Status: DC | PRN
  Administered 2016-10-12 (×4): 0.5 mg via INTRAVENOUS

## 2016-10-12 MED ORDER — KETAMINE HCL-SODIUM CHLORIDE 100-0.9 MG/10ML-% IV SOSY
PREFILLED_SYRINGE | INTRAVENOUS | Status: AC
  Filled 2016-10-12: qty 10

## 2016-10-12 MED ORDER — LACTATED RINGERS IV SOLN
INTRAVENOUS | Status: DC
  Administered 2016-10-12 (×3): via INTRAVENOUS

## 2016-10-12 MED ORDER — MEPERIDINE HCL 25 MG/ML IJ SOLN
INTRAMUSCULAR | Status: AC
  Filled 2016-10-12: qty 1

## 2016-10-12 MED ORDER — METHOCARBAMOL 500 MG PO TABS
500.0000 mg | ORAL_TABLET | Freq: Four times a day (QID) | ORAL | Status: DC | PRN
  Administered 2016-10-12 – 2016-10-13 (×2): 500 mg via ORAL
  Filled 2016-10-12 (×3): qty 1

## 2016-10-12 MED ORDER — TETRACAINE HCL 1 % IJ SOLN
INTRAMUSCULAR | Status: AC
  Filled 2016-10-12: qty 2

## 2016-10-12 MED ORDER — ACETAMINOPHEN 325 MG PO TABS: 650.0000 mg | ORAL_TABLET | Freq: Four times a day (QID) | ORAL | Status: DC | PRN

## 2016-10-12 MED ORDER — PROPOFOL 10 MG/ML IV BOLUS
INTRAVENOUS | Status: DC | PRN
  Administered 2016-10-12: 30 mg via INTRAVENOUS

## 2016-10-12 MED ORDER — CEFAZOLIN SODIUM-DEXTROSE 2-4 GM/100ML-% IV SOLN
INTRAVENOUS | Status: AC
  Filled 2016-10-12: qty 100

## 2016-10-12 MED ORDER — MORPHINE SULFATE (PF) 2 MG/ML IV SOLN
1.0000 mg | INTRAVENOUS | Status: DC | PRN
  Administered 2016-10-12 – 2016-10-13 (×5): 2 mg via INTRAVENOUS
  Filled 2016-10-12 (×5): qty 1

## 2016-10-12 MED ORDER — ONDANSETRON HCL 4 MG/2ML IJ SOLN
INTRAMUSCULAR | Status: DC | PRN
  Administered 2016-10-12: 4 mg via INTRAVENOUS

## 2016-10-12 MED ORDER — METHOCARBAMOL 1000 MG/10ML IJ SOLN
500.0000 mg | Freq: Four times a day (QID) | INTRAVENOUS | Status: DC | PRN
  Filled 2016-10-12: qty 5

## 2016-10-12 MED ORDER — KETAMINE HCL 10 MG/ML IJ SOLN
INTRAMUSCULAR | Status: DC | PRN
  Administered 2016-10-12 (×2): 20 mg via INTRAVENOUS

## 2016-10-12 MED ORDER — FENTANYL CITRATE (PF) 100 MCG/2ML IJ SOLN
INTRAMUSCULAR | Status: DC | PRN
  Administered 2016-10-12 (×2): 50 ug via INTRAVENOUS

## 2016-10-12 MED ORDER — SODIUM CHLORIDE 0.9 % IJ SOLN
INTRAMUSCULAR | Status: DC | PRN
  Administered 2016-10-12: 20 mL via INTRAVENOUS

## 2016-10-12 MED ORDER — LIDOCAINE HCL (CARDIAC) 20 MG/ML IV SOLN
INTRAVENOUS | Status: DC | PRN
  Administered 2016-10-12: 60 mg via INTRATRACHEAL

## 2016-10-12 MED ORDER — FENTANYL CITRATE (PF) 100 MCG/2ML IJ SOLN
INTRAMUSCULAR | Status: AC
  Filled 2016-10-12: qty 2

## 2016-10-12 MED ORDER — SODIUM CHLORIDE 0.9 % IR SOLN
Status: DC | PRN
  Administered 2016-10-12: 3000 mL

## 2016-10-12 MED ORDER — LIDOCAINE 2% (20 MG/ML) 5 ML SYRINGE
INTRAMUSCULAR | Status: AC
  Filled 2016-10-12: qty 5

## 2016-10-12 MED ORDER — BUPIVACAINE-EPINEPHRINE 0.25% -1:200000 IJ SOLN
INTRAMUSCULAR | Status: AC
  Filled 2016-10-12: qty 1

## 2016-10-12 MED ORDER — MIDAZOLAM HCL 2 MG/2ML IJ SOLN
INTRAMUSCULAR | Status: AC
  Filled 2016-10-12: qty 2

## 2016-10-12 MED ORDER — HYDROCODONE-ACETAMINOPHEN 7.5-325 MG PO TABS
1.0000 | ORAL_TABLET | ORAL | Status: DC | PRN
  Administered 2016-10-12 – 2016-10-13 (×2): 2 via ORAL
  Filled 2016-10-12 (×2): qty 2

## 2016-10-12 MED ORDER — MEPERIDINE HCL 25 MG/ML IJ SOLN
6.2500 mg | INTRAMUSCULAR | Status: DC | PRN
  Administered 2016-10-12 (×2): 12.5 mg via INTRAVENOUS

## 2016-10-12 MED ORDER — MIDAZOLAM HCL 2 MG/2ML IJ SOLN
INTRAMUSCULAR | Status: DC | PRN
  Administered 2016-10-12 (×2): 1 mg via INTRAVENOUS

## 2016-10-12 MED ORDER — CEFAZOLIN SODIUM-DEXTROSE 2-4 GM/100ML-% IV SOLN
2.0000 g | INTRAVENOUS | Status: AC
  Administered 2016-10-12: 2 g via INTRAVENOUS

## 2016-10-12 MED ORDER — TRANEXAMIC ACID 1000 MG/10ML IV SOLN
1000.0000 mg | INTRAVENOUS | Status: AC
  Administered 2016-10-12: 1000 mg via INTRAVENOUS
  Filled 2016-10-12: qty 10

## 2016-10-12 SURGICAL SUPPLY — 73 items
BANDAGE ACE 4X5 VEL STRL LF (GAUZE/BANDAGES/DRESSINGS) ×3 IMPLANT
BANDAGE ACE 6X5 VEL STRL LF (GAUZE/BANDAGES/DRESSINGS) ×6 IMPLANT
BANDAGE ELASTIC 4 VELCRO ST LF (GAUZE/BANDAGES/DRESSINGS) ×3 IMPLANT
BANDAGE ESMARK 6X9 LF (GAUZE/BANDAGES/DRESSINGS) ×2 IMPLANT
BLADE 15 SAFETY STRL DISP (BLADE) ×9 IMPLANT
BLADE SAGITTAL 25.0X1.19X90 (BLADE) ×2 IMPLANT
BLADE SAGITTAL 25.0X1.19X90MM (BLADE) ×1
BLADE SAW RECIP 87.9 MT (BLADE) ×3 IMPLANT
BLADE SAW SGTL 13.0X1.19X90.0M (BLADE) ×3 IMPLANT
BLADE SURG ROTATE 9660 (MISCELLANEOUS) IMPLANT
BNDG COHESIVE 6X5 TAN STRL LF (GAUZE/BANDAGES/DRESSINGS) ×3 IMPLANT
BNDG ESMARK 6X9 LF (GAUZE/BANDAGES/DRESSINGS) ×6
BNDG GAUZE ELAST 4 BULKY (GAUZE/BANDAGES/DRESSINGS) ×12 IMPLANT
BOWL SMART MIX CTS (DISPOSABLE) ×6 IMPLANT
CAP KNEE TOTAL 3 SIGMA ×6 IMPLANT
CEMENT HV SMART SET (Cement) ×12 IMPLANT
CLOSURE WOUND 1/2 X4 (GAUZE/BANDAGES/DRESSINGS) ×2
COVER SURGICAL LIGHT HANDLE (MISCELLANEOUS) ×6 IMPLANT
CUFF TOURNIQUET SINGLE 34IN LL (TOURNIQUET CUFF) IMPLANT
CUFF TOURNIQUET SINGLE 44IN (TOURNIQUET CUFF) IMPLANT
DRAPE EXTREMITY BILATERAL (DRAPES) ×3 IMPLANT
DRAPE IMP U-DRAPE 54X76 (DRAPES) ×3 IMPLANT
DRAPE PROXIMA HALF (DRAPES) ×6 IMPLANT
DRAPE U-SHAPE 47X51 STRL (DRAPES) ×9 IMPLANT
DRSG ADAPTIC 3X8 NADH LF (GAUZE/BANDAGES/DRESSINGS) ×6 IMPLANT
DRSG PAD ABDOMINAL 8X10 ST (GAUZE/BANDAGES/DRESSINGS) ×6 IMPLANT
DURAPREP 26ML APPLICATOR (WOUND CARE) ×6 IMPLANT
ELECT REM PT RETURN 9FT ADLT (ELECTROSURGICAL) ×3
ELECTRODE REM PT RTRN 9FT ADLT (ELECTROSURGICAL) ×1 IMPLANT
EVACUATOR 1/8 PVC DRAIN (DRAIN) IMPLANT
GAUZE SPONGE 4X4 12PLY STRL (GAUZE/BANDAGES/DRESSINGS) ×6 IMPLANT
GLOVE BIO SURGEON STRL SZ8 (GLOVE) ×12 IMPLANT
GLOVE BIOGEL PI IND STRL 8 (GLOVE) ×1 IMPLANT
GLOVE BIOGEL PI INDICATOR 8 (GLOVE) ×2
GOWN STRL REUS W/ TWL LRG LVL3 (GOWN DISPOSABLE) ×2 IMPLANT
GOWN STRL REUS W/ TWL XL LVL3 (GOWN DISPOSABLE) ×3 IMPLANT
GOWN STRL REUS W/TWL 2XL LVL3 (GOWN DISPOSABLE) ×3 IMPLANT
GOWN STRL REUS W/TWL LRG LVL3 (GOWN DISPOSABLE) ×4
GOWN STRL REUS W/TWL XL LVL3 (GOWN DISPOSABLE) ×6
HANDPIECE INTERPULSE COAX TIP (DISPOSABLE) ×2
HOOD PEEL AWAY FACE SHEILD DIS (HOOD) ×6 IMPLANT
IMMOBILIZER KNEE 20 (SOFTGOODS) IMPLANT
IMMOBILIZER KNEE 22 UNIV (SOFTGOODS) ×6 IMPLANT
IMMOBILIZER KNEE 24 THIGH 36 (MISCELLANEOUS) IMPLANT
IMMOBILIZER KNEE 24 UNIV (MISCELLANEOUS)
KIT BASIN OR (CUSTOM PROCEDURE TRAY) ×3 IMPLANT
KIT ROOM TURNOVER OR (KITS) ×3 IMPLANT
MANIFOLD NEPTUNE II (INSTRUMENTS) ×3 IMPLANT
NEEDLE 18GX1X1/2 (RX/OR ONLY) (NEEDLE) ×3 IMPLANT
NS IRRIG 1000ML POUR BTL (IV SOLUTION) ×3 IMPLANT
PACK TOTAL JOINT (CUSTOM PROCEDURE TRAY) ×3 IMPLANT
PACK UNIVERSAL I (CUSTOM PROCEDURE TRAY) ×3 IMPLANT
PAD ARMBOARD 7.5X6 YLW CONV (MISCELLANEOUS) ×6 IMPLANT
SET HNDPC FAN SPRY TIP SCT (DISPOSABLE) ×1 IMPLANT
STAPLER VISISTAT 35W (STAPLE) ×6 IMPLANT
STOCKINETTE IMPERVIOUS LG (DRAPES) ×3 IMPLANT
STRIP CLOSURE SKIN 1/2X4 (GAUZE/BANDAGES/DRESSINGS) ×4 IMPLANT
SUCTION FRAZIER HANDLE 10FR (MISCELLANEOUS) ×2
SUCTION TUBE FRAZIER 10FR DISP (MISCELLANEOUS) ×1 IMPLANT
SUT DVC VLOC 180 0 12IN GS21 (SUTURE)
SUT MNCRL AB 3-0 PS2 18 (SUTURE) ×6 IMPLANT
SUT VIC AB 0 CT1 27 (SUTURE) ×8
SUT VIC AB 0 CT1 27XBRD ANBCTR (SUTURE) ×4 IMPLANT
SUT VIC AB 2-0 CT1 27 (SUTURE) ×8
SUT VIC AB 2-0 CT1 TAPERPNT 27 (SUTURE) ×4 IMPLANT
SUT VLOC 180 0 24IN GS25 (SUTURE) ×6 IMPLANT
SUTURE DVC VLC 180 0 12IN GS21 (SUTURE) IMPLANT
SYR 50ML SLIP (SYRINGE) ×3 IMPLANT
TOWEL OR 17X24 6PK STRL BLUE (TOWEL DISPOSABLE) ×3 IMPLANT
TOWEL OR 17X26 10 PK STRL BLUE (TOWEL DISPOSABLE) ×3 IMPLANT
TRAY FOLEY CATH 16FRSI W/METER (SET/KITS/TRAYS/PACK) ×3 IMPLANT
UPCHARGE REV TRAY MBT KNEE ×6 IMPLANT
WATER STERILE IRR 1000ML POUR (IV SOLUTION) ×9 IMPLANT

## 2016-10-12 NOTE — Progress Notes (Signed)
Orthopedic Tech Progress Note Patient Details:  Martha Orr 29-Aug-1956 [id number] Bilateral CPM Left Knee CPM Left Knee: On Left Knee Flexion (Degrees): 90 Left Knee Extension (Degrees): 0 CPM Right Knee CPM Right Knee: On Right Knee Flexion (Degrees): 90 Right Knee Extension (Degrees): 0   [name] 10/12/2016, 2:54 PM

## 2016-10-12 NOTE — Anesthesia Procedure Notes (Signed)
Spinal  Patient location during procedure: OR Start time: 10/12/2016 10:20 AM Staffing Anesthesiologist: Josephine Igo Performed: anesthesiologist  Preanesthetic Checklist Completed: patient identified, site marked, surgical consent, pre-op evaluation, timeout performed, IV checked, risks and benefits discussed and monitors and equipment checked Spinal Block Patient position: sitting Prep: site prepped and draped and DuraPrep Patient monitoring: heart rate, cardiac monitor, continuous pulse ox and blood pressure Approach: midline Location: L3-4 Injection technique: single-shot Needle Needle type: Sprotte  Needle gauge: 24 G Needle length: 9 cm Needle insertion depth: 4.5 cm Assessment Sensory level: T4 Additional Notes Patient tolerated procedure well. Adequate sensory level. Attempt x 2.

## 2016-10-12 NOTE — Anesthesia Postprocedure Evaluation (Signed)
Anesthesia Post Note  Patient: SEVANNA TRISDALE  Procedure(s) Performed: Procedure(s) (LRB): TOTAL KNEE BILATERAL (Bilateral)  Patient location during evaluation: PACU Anesthesia Type: Spinal Level of consciousness: oriented and awake and alert Pain management: pain level controlled Vital Signs Assessment: post-procedure vital signs reviewed and stable Respiratory status: spontaneous breathing, respiratory function stable and patient connected to nasal cannula oxygen Cardiovascular status: blood pressure returned to baseline and stable Postop Assessment: no headache, no backache and spinal receding Anesthetic complications: no    Last Vitals:  Vitals:   10/12/16 0826 10/12/16 1345  BP: 123/79 106/67  Pulse: 70 75  Resp: 20 10  Temp: 36.5 C 36.3 C    Last Pain:  Vitals:   10/12/16 0826  TempSrc: Oral                 Danaiya Steadman A.

## 2016-10-12 NOTE — Op Note (Signed)
PREOP DIAGNOSIS: DJD BOTH KNEES POSTOP DIAGNOSIS: same PROCEDURE: RIGHT and LEFT TKR ANESTHESIA: Spinal and MAC ATTENDING SURGEON: Donnell Wion G ASSISTANT: Loni Dolly PA  INDICATIONS FOR PROCEDURE: Martha Orr is a 60 y.o. female who has struggled for a long time with pain due to degenerative arthritis of both knees.  The patient has failed many conservative non-operative measures and at this point has pain which limits the ability to sleep and walk.  The patient is offered total knee replacements.  Informed operative consent was obtained after discussion of possible risks of anesthesia, infection, neurovascular injury, DVT, and death.  The importance of the post-operative rehabilitation protocol to optimize result was stressed extensively with the patient.  SUMMARY OF FINDINGS AND PROCEDURE:  Martha Orr was taken to the operative suite where under the above anesthesia a right knee replacement was performed.  There were advanced degenerative changes and the bone quality was excellent.  We used the DePuy LCS system and placed size standard femur, 2.5 MBT revision tibia, 32 mm all polyethylene patella, and a size 10 mm spacer.  Loni Dolly PA-C assisted throughout and was invaluable to the completion of the case in that he helped retract and maintain exposure while I placed components.  He also helped close thereby minimizing OR time. We subsequently performed an identical procedure on the opposite left knee. Components were all the same size. The patient was admitted for appropriate post-op care to include perioperative antibiotics and mechanical and pharmacologic measures for DVT prophylaxis.  DESCRIPTION OF PROCEDURE:  Martha Orr was taken to the operative suite where the above anesthesia was applied.  The patient was positioned supine and prepped and draped in normal sterile fashion.  An appropriate time out was performed.  After the administration of kefzol pre-op antibiotic the  leg was elevated and exsanguinated and a tourniquet inflated. A standard longitudinal incision was made on the anterior knee.  Dissection was carried down to the extensor mechanism.  All appropriate anti-infective measures were used including the pre-operative antibiotic, betadine impregnated drape, and closed hooded exhaust systems for each member of the surgical team.  A medial parapatellar incision was made in the extensor mechanism and the knee cap flipped and the knee flexed.  Some residual meniscal tissues were removed along with any remaining ACL/PCL tissue.  A guide was placed on the tibia and a flat cut was made on it's superior surface.  An intramedullary guide was placed in the femur and was utilized to make anterior and posterior cuts creating an appropriate flexion gap.  A second intramedullary guide was placed in the femur to make a distal cut properly balancing the knee with an extension gap equal to the flexion gap.  The three bones sized to the above mentioned sizes and the appropriate guides were placed and utilized.  A trial reduction was done and the knee easily came to full extension and the patella tracked well on flexion.  The trial components were removed and all bones were cleaned with pulsatile lavage and then dried thoroughly.  Cement was mixed and was pressurized onto the bones followed by placement of the aforementioned components.  Excess cement was trimmed and pressure was held on the components until the cement had hardened.  The tourniquet was deflated and a small amount of bleeding was controlled with cautery and pressure.  The knee was irrigated thoroughly.  The extensor mechanism was re-approximated with V-loc suture in running fashion.  The knee was flexed and the repair  was solid.  The subcutaneous tissues were re-approximated with #0 and #2-0 vicryl and the skin closed with a subcuticular stitch and steristrips.  A sterile dressing was applied. At this point the same procedure  was performed in identical fashion on the opposite left knee.  Components were of the same size.  She was closed in an identical fashion as well and the same dressing was applied.  Intraoperative fluids, EBL, and tourniquet time can be obtained from anesthesia records.  DISPOSITION:  The patient was taken to recovery room in stable condition and admitted for appropriate post-op care to include peri-operative antibiotic and DVT prophylaxis with mechanical and pharmacologic measures.  Martha Orr G 10/12/2016, 1:13 PM

## 2016-10-12 NOTE — Interval H&P Note (Signed)
History and Physical Interval Note:  10/12/2016 9:11 AM  Martha Orr  has presented today for surgery, with the diagnosis of BILATERAL KNEE DEGENERATIVE JOINT DISEASE  The various methods of treatment have been discussed with the patient and family. After consideration of risks, benefits and other options for treatment, the patient has consented to  Procedure(s): TOTAL KNEE BILATERAL (Bilateral) as a surgical intervention .  The patient's history has been reviewed, patient examined, no change in status, stable for surgery.  I have reviewed the patient's chart and labs.  Questions were answered to the patient's satisfaction.     Gershom Brobeck G

## 2016-10-12 NOTE — Anesthesia Preprocedure Evaluation (Signed)
Anesthesia Evaluation  Patient identified by MRN, date of birth, ID band Patient awake    Reviewed: Allergy & Precautions, NPO status , Patient's Chart, lab work & pertinent test results  History of Anesthesia Complications (+) Family history of anesthesia reaction  Airway Mallampati: II  TM Distance: >3 FB Neck ROM: Full    Dental no notable dental hx. (+) Teeth Intact   Pulmonary neg pulmonary ROS,    Pulmonary exam normal breath sounds clear to auscultation       Cardiovascular negative cardio ROS Normal cardiovascular exam Rhythm:Regular Rate:Normal     Neuro/Psych PSYCHIATRIC DISORDERS Depression negative neurological ROS     GI/Hepatic negative GI ROS, Neg liver ROS,   Endo/Other  Hypothyroidism Obesity Hyperlipidemia  Renal/GU negative Renal ROS     Musculoskeletal  (+) Arthritis , Osteoarthritis,  Bilateral knee OA   Abdominal (+) + obese,   Peds  Hematology negative hematology ROS (+)   Anesthesia Other Findings   Reproductive/Obstetrics                               Chemistry      Component Value Date/Time   NA 142 10/04/2016 0941   K 3.9 10/04/2016 0941   CL 108 10/04/2016 0941   CO2 26 10/04/2016 0941   BUN 14 10/04/2016 0941   CREATININE 0.68 10/04/2016 0941      Component Value Date/Time   CALCIUM 9.0 10/04/2016 0941     Lab Results  Component Value Date   WBC 5.5 10/04/2016   HGB 12.9 10/04/2016   HCT 38.7 10/04/2016   MCV 89.8 10/04/2016   PLT 214 10/04/2016    Anesthesia Physical Anesthesia Plan  ASA: II  Anesthesia Plan: Spinal   Post-op Pain Management:    Induction: Intravenous  Airway Management Planned: Natural Airway and Nasal Cannula  Additional Equipment:   Intra-op Plan:   Post-operative Plan:   Informed Consent: I have reviewed the patients History and Physical, chart, labs and discussed the procedure including the risks,  benefits and alternatives for the proposed anesthesia with the patient or authorized representative who has indicated his/her understanding and acceptance.   Dental advisory given  Plan Discussed with: Anesthesiologist, CRNA and Surgeon  Anesthesia Plan Comments:         Anesthesia Quick Evaluation

## 2016-10-12 NOTE — Transfer of Care (Signed)
Immediate Anesthesia Transfer of Care Note  Patient: Martha Orr  Procedure(s) Performed: Procedure(s): TOTAL KNEE BILATERAL (Bilateral)  Patient Location: PACU  Anesthesia Type:Spinal  Level of Consciousness: awake, alert  and patient cooperative  Airway & Oxygen Therapy: Patient Spontanous Breathing and Patient connected to nasal cannula oxygen  Post-op Assessment: Report given to RN, Post -op Vital signs reviewed and stable and Patient able to stick tongue midline  Post vital signs: Reviewed and stable  Last Vitals:  Vitals:   10/12/16 0826 10/12/16 1345  BP: 123/79 106/67  Pulse: 70 75  Resp: 20 10  Temp: 36.5 C     Last Pain:  Vitals:   10/12/16 0826  TempSrc: Oral         Complications: No apparent anesthesia complications

## 2016-10-13 ENCOUNTER — Encounter (HOSPITAL_COMMUNITY): Payer: Self-pay

## 2016-10-13 LAB — BASIC METABOLIC PANEL
ANION GAP: 10 (ref 5–15)
BUN: 7 mg/dL (ref 6–20)
CALCIUM: 8.2 mg/dL — AB (ref 8.9–10.3)
CO2: 26 mmol/L (ref 22–32)
Chloride: 100 mmol/L — ABNORMAL LOW (ref 101–111)
Creatinine, Ser: 0.61 mg/dL (ref 0.44–1.00)
GFR calc Af Amer: >60 mL/min (ref >60)
GLUCOSE: 130 mg/dL — AB (ref 65–99)
Potassium: 3.8 mmol/L (ref 3.5–5.1)
SODIUM: 136 mmol/L (ref 135–145)

## 2016-10-13 MED ORDER — MENTHOL 3 MG MT LOZG: 1.0000 | LOZENGE | OROMUCOSAL | Status: DC | PRN

## 2016-10-13 MED ORDER — OXYCODONE HCL 5 MG PO TABS
10.0000 mg | ORAL_TABLET | ORAL | Status: DC | PRN
  Administered 2016-10-13 (×4): 20 mg via ORAL
  Filled 2016-10-13 (×5): qty 4

## 2016-10-13 MED ORDER — ONDANSETRON HCL 4 MG PO TABS: 4.0000 mg | ORAL_TABLET | Freq: Four times a day (QID) | ORAL | Status: DC | PRN

## 2016-10-13 MED ORDER — ONDANSETRON HCL 4 MG/2ML IJ SOLN: 4.0000 mg | Freq: Four times a day (QID) | INTRAMUSCULAR | Status: DC | PRN

## 2016-10-13 MED ORDER — CEFAZOLIN SODIUM-DEXTROSE 2-4 GM/100ML-% IV SOLN
2.0000 g | Freq: Four times a day (QID) | INTRAVENOUS | Status: AC
  Administered 2016-10-13 (×2): 2 g via INTRAVENOUS
  Filled 2016-10-13 (×2): qty 100

## 2016-10-13 MED ORDER — METOCLOPRAMIDE HCL 5 MG PO TABS: 5.0000 mg | ORAL_TABLET | Freq: Three times a day (TID) | ORAL | Status: DC | PRN

## 2016-10-13 MED ORDER — TRANEXAMIC ACID 1000 MG/10ML IV SOLN: 1000.0000 mg | Freq: Once | INTRAVENOUS | Status: DC

## 2016-10-13 MED ORDER — ALUM & MAG HYDROXIDE-SIMETH 200-200-20 MG/5ML PO SUSP: 30.0000 mL | ORAL | Status: DC | PRN

## 2016-10-13 MED ORDER — ACETAMINOPHEN 650 MG RE SUPP: 650.0000 mg | Freq: Four times a day (QID) | RECTAL | Status: DC | PRN

## 2016-10-13 MED ORDER — HYDROMORPHONE HCL 2 MG/ML IJ SOLN
0.5000 mg | INTRAMUSCULAR | Status: DC | PRN
  Administered 2016-10-14: 1 mg via INTRAVENOUS
  Filled 2016-10-13: qty 1

## 2016-10-13 MED ORDER — DIPHENHYDRAMINE HCL 12.5 MG/5ML PO ELIX: 12.5000 mg | ORAL_SOLUTION | ORAL | Status: DC | PRN

## 2016-10-13 MED ORDER — ROSUVASTATIN CALCIUM 10 MG PO TABS
20.0000 mg | ORAL_TABLET | Freq: Every day | ORAL | Status: DC
  Administered 2016-10-13 – 2016-10-17 (×5): 20 mg via ORAL
  Filled 2016-10-13 (×5): qty 2

## 2016-10-13 MED ORDER — PHENOL 1.4 % MT LIQD: 1.0000 | OROMUCOSAL | Status: DC | PRN

## 2016-10-13 MED ORDER — ASPIRIN EC 325 MG PO TBEC
325.0000 mg | DELAYED_RELEASE_TABLET | Freq: Two times a day (BID) | ORAL | Status: DC
  Administered 2016-10-13 – 2016-10-17 (×10): 325 mg via ORAL
  Filled 2016-10-13 (×9): qty 1

## 2016-10-13 MED ORDER — LEVOTHYROXINE SODIUM 112 MCG PO TABS
112.0000 ug | ORAL_TABLET | Freq: Every evening | ORAL | Status: DC
  Administered 2016-10-13 – 2016-10-17 (×5): 112 ug via ORAL
  Filled 2016-10-13 (×4): qty 1

## 2016-10-13 MED ORDER — OXYCODONE HCL ER 10 MG PO T12A
10.0000 mg | EXTENDED_RELEASE_TABLET | Freq: Two times a day (BID) | ORAL | Status: DC
  Administered 2016-10-13 – 2016-10-14 (×3): 10 mg via ORAL
  Filled 2016-10-13 (×3): qty 1

## 2016-10-13 MED ORDER — METHOCARBAMOL 750 MG PO TABS
750.0000 mg | ORAL_TABLET | Freq: Four times a day (QID) | ORAL | Status: DC | PRN
  Administered 2016-10-13 – 2016-10-15 (×6): 750 mg via ORAL
  Filled 2016-10-13 (×6): qty 1

## 2016-10-13 MED ORDER — ESCITALOPRAM OXALATE 10 MG PO TABS
10.0000 mg | ORAL_TABLET | Freq: Every day | ORAL | Status: DC
  Administered 2016-10-13 – 2016-10-17 (×5): 10 mg via ORAL
  Filled 2016-10-13 (×5): qty 1

## 2016-10-13 MED ORDER — BISACODYL 5 MG PO TBEC: 5.0000 mg | DELAYED_RELEASE_TABLET | Freq: Every day | ORAL | Status: DC | PRN

## 2016-10-13 MED ORDER — METOCLOPRAMIDE HCL 5 MG/ML IJ SOLN: 5.0000 mg | Freq: Three times a day (TID) | INTRAMUSCULAR | Status: DC | PRN

## 2016-10-13 MED ORDER — ACETAMINOPHEN 325 MG PO TABS
650.0000 mg | ORAL_TABLET | Freq: Four times a day (QID) | ORAL | Status: DC | PRN
  Administered 2016-10-18: 650 mg via ORAL
  Filled 2016-10-13: qty 2

## 2016-10-13 MED ORDER — DOCUSATE SODIUM 100 MG PO CAPS
100.0000 mg | ORAL_CAPSULE | Freq: Two times a day (BID) | ORAL | Status: DC
  Administered 2016-10-13 – 2016-10-16 (×7): 100 mg via ORAL
  Filled 2016-10-13 (×8): qty 1

## 2016-10-13 MED ORDER — HYDROMORPHONE HCL 2 MG/ML IJ SOLN: 0.5000 mg | INTRAMUSCULAR | Status: DC | PRN

## 2016-10-13 NOTE — Care Management Note (Signed)
Case Management Note  Patient Details  Name: Martha Orr MRN: [id number] Date of Birth: [birth year]  Subjective/Objective:  60 yr old female s/p bilateral knees arthroplasty.                  Action/Plan:  Case manager spoke with patient and her husband concerning [place] and DME needs. Patient was preoperatively setup with [place], no changes> [name] states they have requested a lift chair and a wide hospital bed to accommodate both CPM's , CM confirmed with the [facility] store that the order is in the main office. Someone from the Store will contact the patient or [name]. His Cell # is [phone]. CPM's have been delivered to the home by [employer].    Expected Discharge Date:    to be determined              Expected Discharge Plan:  [place]  In-House Referral:     Discharge planning Services  CM Consult  Post Acute Care Choice:  Durable Medical Equipment, Home Health Choice offered to:  Patient  DME Arranged:  Hospital bed, Other see comment (lift chair and wide hospital bed have been ordered) DME Agency:  [place]:  PT [place]:  [place]  Status of Service:  In process, will continue to follow  If discussed at Long Length of Stay Meetings, dates discussed:    Additional Comments:  Ninfa Meeker, RN 10/13/2016, 12:28 PM

## 2016-10-13 NOTE — Progress Notes (Signed)
Orthopedic Tech Progress Note Patient Details:  Martha Orr Jun 28, 1956 [id number]  Patient ID: Martha Orr, female   DOB: 10-10-1956, 60 y.o.   MRN: [id number] Applied bi cpms 0-30  Martha Orr 10/13/2016, 6:25 AM

## 2016-10-13 NOTE — Progress Notes (Addendum)
Subjective: 1 Day Post-Op Procedure(s) (LRB): TOTAL KNEE BILATERAL (Bilateral)   Patient having some significant pain. She is asking that we change around her medications.   Activity level:  wbat Diet tolerance:  ok Voiding:  Foley in place Patient reports pain as moderate and severe.    Objective: Vital signs in last 24 hours: Temp:  [97.3 F (36.3 C)-98.8 F (37.1 C)] 98.8 F (37.1 C) (10/18 0423) Pulse Rate:  [56-103] 88 (10/18 0423) Resp:  [10-23] 17 (10/18 0423) BP: (89-123)/(57-80) 120/64 (10/18 0423) SpO2:  [90 %-100 %] 96 % (10/18 0423) Weight:  [89.8 kg (198 lb)] 89.8 kg (198 lb) (10/17 0826)  Labs: No results for input(s): HGB in the last 72 hours. No results for input(s): WBC, RBC, HCT, PLT in the last 72 hours. No results for input(s): NA, K, CL, CO2, BUN, CREATININE, GLUCOSE, CALCIUM in the last 72 hours. No results for input(s): LABPT, INR in the last 72 hours.  Physical Exam:  Neurologically intact ABD soft Neurovascular intact Sensation intact distally Intact pulses distally Dorsiflexion/Plantar flexion intact Incision: dressing C/D/I and no drainage No cellulitis present Compartment soft  Assessment/Plan:  1 Day Post-Op Procedure(s) (LRB): TOTAL KNEE BILATERAL (Bilateral) Advance diet Up with therapy D/C IV fluids Discharge home with home health either tomorrow or Friday depending on how she is feeling and does with PT. Continue on ASA 325mg  BID x 4 weeks for DVT prevention. Follow up in office 2 weeks post op. I will change dressings to aquacel tomorrow. We have increased robaxin, oxycodone and added some oxycontin. Hopefully this will better help control her pain.   Tesslyn Baumert, [name] 10/13/2016, 7:57 AM

## 2016-10-13 NOTE — Progress Notes (Signed)
Orthopedic Tech Progress Note Patient Details:  Martha Orr Jun 17, 1956 [id number] Ortho visit put on cpm at [place] Patient ID: [name], female   DOB: 06/01/1956, 60 y.o.   MRN: [id number]   Braulio Bosch 10/13/2016, 6:46 PM

## 2016-10-13 NOTE — Evaluation (Signed)
Physical Therapy Evaluation Patient Details Name: Martha Orr MRN: [id number] DOB: 04-Feb-1956 Today's Date: 10/13/2016   History of Present Illness  Pt is a 60 y/o female s/p bilateral TKA's. PMH including but not limited to hypothyroidism, high cholesterol and depression.  Clinical Impression  Pt presented supine in bed with HOB elevated, awake with bilateral LEs in CPM and reporting increased pain in bilateral patella. Pt's spouse present throughout session. Pt was greatly limited secondary to pain this session, only able to participate in therapeutic exercises in supine. Pt requesting pain meds prior to next PT session; however, pt was given pain medicine prior to this session as well according to her nurse and EMR. PT will attempt a second session with hopes of improved participation and transfer training. Pt would continue to benefit from skilled physical therapy services at this time while admitted and after d/c to address her below listed limitations in order to improve her overall safety and independence with functional mobility. At this time PT recommending pt d/c to CIR prior to returning home for intensive therapy services. This recommendation is subject to change pending pt's progress while admitted.     Follow Up Recommendations CIR;Supervision/Assistance - 24 hour (if pt refuses or does not qualify, pt will need HH PT)    Equipment Recommendations  None recommended by PT    Recommendations for Other Services       Precautions / Restrictions Precautions Precautions: Knee;Fall Precaution Booklet Issued: Yes (comment) Restrictions Weight Bearing Restrictions: Yes RLE Weight Bearing: Weight bearing as tolerated LLE Weight Bearing: Weight bearing as tolerated      Mobility  Bed Mobility               General bed mobility comments: pt unable to participate further in evaluation secondary to pain; agreeable to therex in bed  Transfers                  General transfer comment: please see above comment  Ambulation/Gait             General Gait Details: please see above comment  Stairs            Wheelchair Mobility    Modified Rankin (Stroke Patients Only)       Balance                                             Pertinent Vitals/Pain Pain Assessment: Faces Faces Pain Scale: Hurts worst Pain Location: bilateral knees (R>L), bilateral patella Pain Descriptors / Indicators: Grimacing;Guarding;Crying;Moaning Pain Intervention(s): Monitored during session;Repositioned;Ice applied    Home Living Family/patient expects to be discharged to:: Private residence Living Arrangements: Spouse/significant other Available Help at Discharge: Family;Available 24 hours/day Type of Home: House Home Access: Ramped entrance     Home Layout: One level Home Equipment: Walker - 2 wheels;Bedside commode      Prior Function Level of Independence: Independent               Hand Dominance        Extremity/Trunk Assessment   Upper Extremity Assessment: Defer to OT evaluation           Lower Extremity Assessment: RLE deficits/detail;LLE deficits/detail RLE Deficits / Details: Pt with decreased strength and ROM limitations secondary to post-op. Sensation grossly intact. LLE Deficits / Details: Pt with decreased strength and ROM limitations secondary  to post-op. Sensation grossly intact.     Communication   Communication: No difficulties  Cognition Arousal/Alertness: Awake/alert Behavior During Therapy: WFL for tasks assessed/performed Overall Cognitive Status: Within Functional Limits for tasks assessed                      General Comments      Exercises Total Joint Exercises Ankle Circles/Pumps: AAROM;Right;Left;10 reps;Supine Quad Sets: AROM;AAROM;Right;Left;10 reps;Supine Gluteal Sets: AROM;Both;20 reps;Supine Heel Slides: AAROM;Left;Supine;Other reps (comment) (3) Hip  ABduction/ADduction: AAROM;Left;10 reps;Supine   Assessment/Plan    PT Assessment Patient needs continued PT services  PT Problem List Decreased strength;Decreased range of motion;Decreased activity tolerance;Decreased balance;Decreased mobility;Decreased coordination;Decreased knowledge of use of DME;Pain          PT Treatment Interventions DME instruction;Gait training;Stair training;Functional mobility training;Therapeutic activities;Therapeutic exercise;Balance training;Neuromuscular re-education;Patient/family education    PT Goals (Current goals can be found in the Care Plan section)  Acute Rehab PT Goals Patient Stated Goal: decrease pain PT Goal Formulation: With patient/family Time For Goal Achievement: 10/20/16 Potential to Achieve Goals: Good    Frequency 7X/week   Barriers to discharge        Co-evaluation               End of Session   Activity Tolerance: Patient limited by pain Patient left: in bed;with call bell/phone within reach;with family/visitor present;with SCD's reapplied Nurse Communication: Mobility status         Time: 1345-1418 PT Time Calculation (min) (ACUTE ONLY): 33 min   Charges:   PT Evaluation $PT Eval Moderate Complexity: 1 Procedure PT Treatments $Therapeutic Exercise: 8-22 mins   PT G CodesAlessandra Bevels Dontasia Miranda 10/13/2016, 3:08 PM Deborah Chalk, PT, DPT [phone]

## 2016-10-13 NOTE — Progress Notes (Signed)
Physical Therapy Treatment Patient Details Name: Martha Orr MRN: [id number] DOB: 1956-03-22 Today's Date: 10/13/2016    History of Present Illness Pt is a 60 y/o female s/p bilateral TKA's. PMH including but not limited to hypothyroidism, high cholesterol and depression.    PT Comments    Pt presented supine in bed with HOB elevated, awake and willing to participate in therapy session. Pt making slow progress towards achieving her functional goals secondary to pain. She was able to achieve sitting EOB with max A x2 and sat EOB x15 mins with min guard. Pt would continue to benefit from skilled physical therapy services at this time while admitted and after d/c to address her limitations in order to improve her overall safety and independence with functional mobility.   Follow Up Recommendations  CIR;Supervision/Assistance - 24 hour     Equipment Recommendations  None recommended by PT    Recommendations for Other Services Rehab consult     Precautions / Restrictions Precautions Precautions: Knee;Fall Precaution Booklet Issued: Yes (comment) Restrictions Weight Bearing Restrictions: Yes RLE Weight Bearing: Weight bearing as tolerated LLE Weight Bearing: Weight bearing as tolerated    Mobility  Bed Mobility Overal bed mobility: Needs Assistance;+2 for physical assistance Bed Mobility: Supine to Sit;Sit to Supine     Supine to sit: Max assist;+2 for physical assistance;HOB elevated Sit to supine: Max assist;+2 for physical assistance   General bed mobility comments: pt required increased time and max A with bilateral LEs and upper body to achieve sitting EOB  Transfers                 General transfer comment: please see above comment  Ambulation/Gait             General Gait Details: please see above comment   Stairs            Wheelchair Mobility    Modified Rankin (Stroke Patients Only)       Balance Overall balance assessment:  Needs assistance Sitting-balance support: Feet supported;Bilateral upper extremity supported Sitting balance-Leahy Scale: Poor Sitting balance - Comments: pt able to sit EOB x15 minutes with min guard and bilateral UE supports. pt with increased pain in bilateral knees and requesting to return to supine at that time.                            Cognition Arousal/Alertness: Awake/alert Behavior During Therapy: WFL for tasks assessed/performed Overall Cognitive Status: Within Functional Limits for tasks assessed                      Exercises Total Joint Exercises Ankle Circles/Pumps: AAROM;Right;Left;10 reps;Supine Quad Sets: AROM;AAROM;Right;Left;10 reps;Supine Gluteal Sets: AROM;Both;20 reps;Supine Heel Slides: AAROM;Left;Supine;Other reps (comment) (3) Hip ABduction/ADduction: AAROM;Left;10 reps;Supine    General Comments        Pertinent Vitals/Pain Pain Assessment: Faces Faces Pain Scale: Hurts even more Pain Location: bilateral knees Pain Descriptors / Indicators: Grimacing;Guarding;Moaning;Sore Pain Intervention(s): Monitored during session;Repositioned;RN gave pain meds during session    Home Living Family/patient expects to be discharged to:: Private residence Living Arrangements: Spouse/significant other Available Help at Discharge: Family;Available 24 hours/day Type of Home: House Home Access: Ramped entrance   Home Layout: One level Home Equipment: Walker - 2 wheels;Bedside commode      Prior Function Level of Independence: Independent          PT Goals (current goals can now be found in the  care plan section) Acute Rehab PT Goals Patient Stated Goal: decrease pain PT Goal Formulation: With patient/family Time For Goal Achievement: 10/20/16 Potential to Achieve Goals: Good Progress towards PT goals: Progressing toward goals    Frequency    7X/week      PT Plan Current plan remains appropriate    Co-evaluation              End of Session   Activity Tolerance: Patient limited by pain Patient left: in bed;with call bell/phone within reach;with family/visitor present;with SCD's reapplied     Time: 1610-9604 PT Time Calculation (min) (ACUTE ONLY): 25 min  Charges:  $Therapeutic Activity: 23-37 mins                    G CodesAlessandra Bevels Jaymian Bogart 11-05-16, 5:52 PM Deborah Chalk, PT, DPT [phone]

## 2016-10-14 ENCOUNTER — Inpatient Hospital Stay (HOSPITAL_COMMUNITY): Payer: 59

## 2016-10-14 LAB — CBC
HCT: 32.5 % — ABNORMAL LOW (ref 36.0–46.0)
Hemoglobin: 11.1 g/dL — ABNORMAL LOW (ref 12.0–15.0)
MCH: 29.8 pg (ref 26.0–34.0)
MCHC: 34.2 g/dL (ref 30.0–36.0)
MCV: 87.4 fL (ref 78.0–100.0)
PLATELETS: 170 10*3/uL (ref 150–400)
RBC: 3.72 MIL/uL — AB (ref 3.87–5.11)
RDW: 13.3 % (ref 11.5–15.5)
WBC: 8.8 10*3/uL (ref 4.0–10.5)

## 2016-10-14 MED ORDER — LACTATED RINGERS IV BOLUS (SEPSIS)
500.0000 mL | Freq: Once | INTRAVENOUS | Status: AC
  Administered 2016-10-14: 500 mL via INTRAVENOUS

## 2016-10-14 MED ORDER — OXYCODONE-ACETAMINOPHEN 5-325 MG PO TABS
1.0000 | ORAL_TABLET | ORAL | Status: DC | PRN
  Administered 2016-10-14: 1 via ORAL
  Administered 2016-10-14: 2 via ORAL
  Administered 2016-10-14 – 2016-10-16 (×6): 1 via ORAL
  Administered 2016-10-16: 2 via ORAL
  Administered 2016-10-17: 1 via ORAL
  Administered 2016-10-17: 2 via ORAL
  Administered 2016-10-18: 1 via ORAL
  Filled 2016-10-14: qty 2
  Filled 2016-10-14 (×3): qty 1
  Filled 2016-10-14 (×2): qty 2
  Filled 2016-10-14 (×4): qty 1
  Filled 2016-10-14: qty 2
  Filled 2016-10-14: qty 1

## 2016-10-14 NOTE — Evaluation (Signed)
Occupational Therapy Evaluation Patient Details Name: Martha Orr MRN: [id number] DOB: [birth year] Today's Date: 10/14/2016    History of Present Illness Pt is a 60 y/o female s/p bilateral TKA's. PMH including but not limited to hypothyroidism, high cholesterol and depression.   Clinical Impression   PTA, pt was independent with basic ADL and IADL. Pt currently significantly limited by pain and lethargy at this time.  Pt required max assist x2 to sit EOB for functional activity and was unable to maintain balance without support from therapist and B UEs. Able to complete ADL at bed level only with max assist for LB Pt would benefit from continued OT services while admitted to improve ADL independence prior to D/C. Recommend D/C to CIR for continued rehabilitation to improve independence with ADL and functional mobility. OT will continue to follow acutely.    Follow Up Recommendations  CIR;Supervision/Assistance - 24 hour;Other (comment) (If pt refuses will need HH OT)    Equipment Recommendations  None recommended by OT       Precautions / Restrictions Precautions Precautions: Knee;Fall Precaution Booklet Issued: No Restrictions Weight Bearing Restrictions: Yes RLE Weight Bearing: Weight bearing as tolerated LLE Weight Bearing: Weight bearing as tolerated      Mobility Bed Mobility Overal bed mobility: Needs Assistance;+2 for physical assistance Bed Mobility: Supine to Sit;Sit to Supine     Supine to sit: Max assist;+2 for physical assistance;HOB elevated Sit to supine: Max assist;+2 for physical assistance   General bed mobility comments: pt required increased time and max A with bilateral LEs and upper body to achieve sitting EOB  Transfers                 General transfer comment: attempted STS x3 with Stedy and x2 max A; unable to achieve standing from bed. pt limited secondary to pain and lethargy.    Balance Overall balance assessment: Needs  assistance Sitting-balance support: Feet supported;Bilateral upper extremity supported Sitting balance-Leahy Scale: Poor Sitting balance - Comments: pt able to sit EOB x15 minutes with min guard and bilateral UE supports. pt with increased pain in bilateral knees and requesting to return to supine at that time.                                    ADL Overall ADL's : Needs assistance/impaired Eating/Feeding: Set up;Bed level;Supervision/ safety   Grooming: Set up;Bed level;Supervision/safety   Upper Body Bathing: Supervision/ safety;Set up;Bed level   Lower Body Bathing: Minimal assistance;Bed level   Upper Body Dressing : Set up;Bed level;Supervision/safety   Lower Body Dressing: Maximal assistance;Bed level       Toileting- Clothing Manipulation and Hygiene: Bed level;Set up;Supervision/safety       Functional mobility during ADLs:  (Attempted sit<>stand with max assist +2; unable to complete) General ADL Comments: Pt educated on ADL post-operatively. Pt limited by lethargy and pain this date. Attempted sit<>stand with max assist +2 with Stedy and unable to complete. All ADL assessed at bed level due to pt's decreased mobility status.               Pertinent Vitals/Pain Pain Assessment: Faces Faces Pain Scale: Hurts even more Pain Location: bilateral knees Pain Descriptors / Indicators: Grimacing;Guarding;Moaning;Sore Pain Intervention(s): Monitored during session;Repositioned;Ice applied     Hand Dominance Right   Extremity/Trunk Assessment Upper Extremity Assessment Upper Extremity Assessment: Overall WFL for tasks assessed   Lower Extremity Assessment  Lower Extremity Assessment: RLE deficits/detail;LLE deficits/detail RLE Deficits / Details: Decreased strength and ROM as expected post-operatively. RLE: Unable to fully assess due to pain LLE Deficits / Details: Decreased strength and ROM as expected post-operatively. LLE: Unable to fully assess  due to pain       Communication Communication Communication: No difficulties   Cognition Arousal/Alertness: Lethargic;Suspect due to medications Behavior During Therapy: [facility] for tasks assessed/performed;Anxious Overall Cognitive Status: Within Functional Limits for tasks assessed       Memory: Decreased short-term memory                        Home Living Family/patient expects to be discharged to:: Private residence Living Arrangements: Spouse/significant other Available Help at Discharge: Family;Available 24 hours/day Type of Home: House Home Access: Ramped entrance     Home Layout: One level     Bathroom Shower/Tub: Occupational psychologist: Standard     Home Equipment: Environmental consultant - 2 wheels;Shower seat;Bedside commode          Prior Functioning/Environment Level of Independence: Independent                 OT Problem List: Decreased strength;Decreased range of motion;Decreased activity tolerance;Impaired balance (sitting and/or standing);Decreased knowledge of use of DME or AE;Decreased safety awareness;Decreased knowledge of precautions;Pain   OT Treatment/Interventions: Self-care/ADL training;Therapeutic exercise;DME and/or AE instruction;Patient/family education;Balance training    OT Goals(Current goals can be found in the care plan section) Acute Rehab OT Goals Patient Stated Goal: decrease pain OT Goal Formulation: With patient/family Time For Goal Achievement: 10/28/16 Potential to Achieve Goals: Fair ADL Goals Pt Will Perform Upper Body Bathing: with modified independence;sitting Pt Will Perform Lower Body Bathing: with mod assist;sit to/from stand Pt Will Perform Upper Body Dressing: with modified independence;sitting Pt Will Perform Lower Body Dressing: with mod assist;sit to/from stand Pt Will Transfer to Toilet: with mod assist;bedside commode;ambulating Pt Will Perform Toileting - Clothing Manipulation and hygiene: with mod  assist;sit to/from stand Pt Will Perform Tub/Shower Transfer: with max assist;rolling walker;3 in 1  OT Frequency: Min 2X/week           Co-evaluation PT/OT/SLP Co-Evaluation/Treatment: Yes Reason for Co-Treatment: For patient/therapist safety PT goals addressed during session: Mobility/safety with mobility;Proper use of DME;Strengthening/ROM OT goals addressed during session: ADL's and self-care      End of Session Equipment Utilized During Treatment: Gait belt Charlaine Dalton) CPM Left Knee CPM Left Knee: Off Nurse Communication: Other (comment) (Pt wheezing)  Activity Tolerance: Patient limited by lethargy;Patient limited by pain Patient left: in bed;with call bell/phone within reach;with family/visitor present   Time: [id number] OT Time Calculation (min): 65 min Charges:  OT General Charges $OT Visit: 1 Procedure OT Evaluation $OT Eval Moderate Complexity: 1 Procedure OT Treatments $Self Care/Home Management : 8-22 mins  Norman Herrlich, OTR/L [phone] 10/14/2016, 11:03 AM

## 2016-10-14 NOTE — Progress Notes (Signed)
Subjective: 2 Days Post-Op Procedure(s) (LRB): TOTAL KNEE BILATERAL (Bilateral)   Patient is feeling quite foggy headed and over sedated. She is also having a productive cough.  Activity level:  wbat Diet tolerance:  ok Voiding:  ok Patient reports pain as mild.    Objective: Vital signs in last 24 hours: Temp:  [98.6 F (37 C)-98.8 F (37.1 C)] 98.8 F (37.1 C) (10/19 1148) Pulse Rate:  [92-113] 113 (10/19 1148) Resp:  [17] 17 (10/19 0441) BP: (105-143)/(61-88) 143/64 (10/19 1148) SpO2:  [93 %-96 %] 93 % (10/19 1148)  Labs:  Recent Labs  10/14/16 0348  HGB 11.1*    Recent Labs  10/14/16 0348  WBC 8.8  RBC 3.72*  HCT 32.5*  PLT 170    Recent Labs  10/13/16 0912  NA 136  K 3.8  CL 100*  CO2 26  BUN 7  CREATININE 0.61  GLUCOSE 130*  CALCIUM 8.2*   No results for input(s): LABPT, INR in the last 72 hours.  Physical Exam:  Neurologically intact ABD soft Neurovascular intact Sensation intact distally Intact pulses distally Dorsiflexion/Plantar flexion intact Incision: dressing C/D/I and no drainage No cellulitis present Compartment soft  Assessment/Plan:  2 Days Post-Op Procedure(s) (LRB): TOTAL KNEE BILATERAL (Bilateral) Advance diet Up with therapy Plan for discharge tomorrow Discharge home with home health if doing well and cleared by PT. We will cut back on pain meds and we will also give a 500cc bolus.  We changed dressings to aquacel today.  Continue on ASA 325mg  BID x 4 weeks post op.  Follow up in office 2 weeks post op.   Hilmer Aliberti, [name] 10/14/2016, 1:21 PM

## 2016-10-14 NOTE — Progress Notes (Signed)
Orthopedic Tech Progress Note Patient Details:  Martha Orr 11-19-1956 [id number]  Patient ID: [name], female   DOB: [birth year], 61 y.o.   MRN: [id number]   Hildred Priest 10/14/2016, 1:42 PM Placed bilateral cpms on pt @1340 ; RN notified

## 2016-10-14 NOTE — Progress Notes (Signed)
Physical Therapy Treatment Patient Details Name: Martha Orr MRN: [id number] DOB: 1956/07/27 Today's Date: 10/14/2016    History of Present Illness Pt is a 60 y/o female s/p bilateral TKA's. PMH including but not limited to hypothyroidism, high cholesterol and depression.    PT Comments    Pt presented supine in bed with HOB elevated, awake and willing to participate in therapy session. Pt's husband was present throughout session as well. Pt was limited secondary to pain and lethargy this session. She continues to require max A x2 for bed mobility and to achieve sitting EOB. Pt attempted STS x3 from bed with use of Stedy and max A x2 but was unsuccessful. Pt would continue to benefit from skilled physical therapy services at this time while admitted and after d/c to address her limitations in order to improve her overall safety and independence with functional mobility. PT continuing to recommend pt d/c to CIR for intensive therapy services.    Follow Up Recommendations  CIR;Supervision/Assistance - 24 hour     Equipment Recommendations  None recommended by PT    Recommendations for Other Services Rehab consult     Precautions / Restrictions Precautions Precautions: Knee;Fall Precaution Booklet Issued: No Restrictions Weight Bearing Restrictions: Yes RLE Weight Bearing: Weight bearing as tolerated LLE Weight Bearing: Weight bearing as tolerated    Mobility  Bed Mobility Overal bed mobility: Needs Assistance;+2 for physical assistance Bed Mobility: Supine to Sit;Sit to Supine     Supine to sit: Max assist;+2 for physical assistance;HOB elevated Sit to supine: Max assist;+2 for physical assistance   General bed mobility comments: pt required increased time and max A with bilateral LEs and upper body to achieve sitting EOB  Transfers                 General transfer comment: attempted STS x3 with Stedy and x2 max A; unable to achieve standing from bed. pt  limited secondary to pain and lethargy.  Ambulation/Gait                 Stairs            Wheelchair Mobility    Modified Rankin (Stroke Patients Only)       Balance Overall balance assessment: Needs assistance Sitting-balance support: Feet supported;Bilateral upper extremity supported Sitting balance-Leahy Scale: Poor Sitting balance - Comments: pt able to sit EOB x15 minutes with min guard and bilateral UE supports. pt with increased pain in bilateral knees and requesting to return to supine at that time.                            Cognition Arousal/Alertness: Lethargic;Suspect due to medications Behavior During Therapy: [facility] for tasks assessed/performed;Anxious Overall Cognitive Status: Within Functional Limits for tasks assessed       Memory: Decreased short-term memory              Exercises      General Comments        Pertinent Vitals/Pain Pain Assessment: Faces Faces Pain Scale: Hurts even more Pain Location: bilateral knees Pain Descriptors / Indicators: Grimacing;Guarding;Moaning;Sore Pain Intervention(s): Monitored during session;Repositioned;Ice applied    Home Living Family/patient expects to be discharged to:: Private residence Living Arrangements: Spouse/significant other Available Help at Discharge: Family;Available 24 hours/day Type of Home: House Home Access: Ramped entrance   Home Layout: One level Home Equipment: Walker - 2 wheels;Shower seat;Bedside commode      Prior Function  Level of Independence: Independent          PT Goals (current goals can now be found in the care plan section) Acute Rehab PT Goals Patient Stated Goal: decrease pain PT Goal Formulation: With patient/family Time For Goal Achievement: 10/20/16 Potential to Achieve Goals: Good Progress towards PT goals: Progressing toward goals    Frequency    7X/week      PT Plan Current plan remains appropriate    Co-evaluation  PT/OT/SLP Co-Evaluation/Treatment: Yes Reason for Co-Treatment: For patient/therapist safety PT goals addressed during session: Mobility/safety with mobility;Proper use of DME;Strengthening/ROM OT goals addressed during session: ADL's and self-care     End of Session Equipment Utilized During Treatment: Gait belt;Other (comment) Antony Salmon) Activity Tolerance: Patient limited by lethargy;Patient limited by pain Patient left: in bed;with call bell/phone within reach;with family/visitor present;Other (comment) (OT in room)     Time: 4332-9518 PT Time Calculation (min) (ACUTE ONLY): 39 min  Charges:  $Therapeutic Activity: 23-37 mins                    G CodesAlessandra Bevels Chalee Hirota 2016/11/12, 11:46 AM

## 2016-10-14 NOTE — Progress Notes (Signed)
Orthopedic Tech Progress Note Patient Details:  [name] 1956-04-06 [id number] Ortho visit put on cpm at [place] Patient ID: Martha Orr, female   DOB: 1956/12/13, 60 y.o.   MRN: [id number]   Braulio Bosch 10/14/2016, 6:20 PM

## 2016-10-14 NOTE — Progress Notes (Signed)
Orthopedic Tech Progress Note Patient Details:  Martha Orr August 05, 1956 [id number]  Patient ID: [name], female   DOB: Jan 18, 1956, 60 y.o.   MRN: [id number] Applied bi cpms 0-30. Set to 0-30 because that is what the pt said to set it at.  Karolee Stamps 10/14/2016, 6:06 AM

## 2016-10-14 NOTE — Progress Notes (Signed)
Physical Therapy Treatment Patient Details Name: Martha Orr MRN: [id number] DOB: November 29, 1956 Today's Date: 10/14/2016    History of Present Illness Pt is a 60 y/o female s/p bilateral TKA's. PMH including but not limited to hypothyroidism, high cholesterol and depression.    PT Comments    Pt presented supine in bed with HOB elevated, awake and willing to participate in therapy session. Pt only able to perform therapeutic exercises in supine this session secondary to reports of 9/10 pain in bilateral knees and pt remains very lethargic. Pt with difficulty following commands, as well as with sequencing and motor planning during exercises. PT spoke openly with pt and pt's spouse regarding pt's currently level of function and concerns regarding returning home. Both pt and pt's husband acknowledge that pt is not ready to d/c home from hospital and would greatly benefit from intensive therapy services in an inpatient rehab facility. Pt would continue to benefit from skilled physical therapy services at this time while admitted and after d/c to address her limitations in order to improve her overall safety and independence with functional mobility.   Follow Up Recommendations  CIR;Supervision/Assistance - 24 hour     Equipment Recommendations  None recommended by PT    Recommendations for Other Services Rehab consult     Precautions / Restrictions Precautions Precautions: Knee;Fall Precaution Booklet Issued: Yes (comment) Precaution Comments: PT discussed positioning of LEs following TKA with pt and pt's spouse. Restrictions Weight Bearing Restrictions: Yes RLE Weight Bearing: Weight bearing as tolerated LLE Weight Bearing: Weight bearing as tolerated    Mobility  Bed Mobility               General bed mobility comments: pt agreeable to participate in bed level therapeutic exercises  Transfers                    Ambulation/Gait                 Stairs            Wheelchair Mobility    Modified Rankin (Stroke Patients Only)       Balance                                    Cognition Arousal/Alertness: Lethargic;Suspect due to medications Behavior During Therapy: [facility] for tasks assessed/performed;Anxious Overall Cognitive Status: Impaired/Different from baseline Area of Impairment: Attention;Memory;Following commands;Problem solving   Current Attention Level: Selective Memory: Decreased short-term memory Following Commands: Follows one step commands inconsistently;Follows one step commands with increased time     Problem Solving: Slow processing;Decreased initiation;Requires verbal cues;Requires tactile cues General Comments: suspect due to medications    Exercises Total Joint Exercises Ankle Circles/Pumps: AROM;AAROM;Both;20 reps;Supine Quad Sets: AROM;Both;10 reps;Supine Gluteal Sets: AROM;Both;10 reps;Supine Heel Slides: AAROM;Both;10 reps;Supine Hip ABduction/ADduction: AAROM;Both;10 reps;Supine    General Comments        Pertinent Vitals/Pain Pain Assessment: 0-10 Pain Score: 9  Pain Location: bilateral knees Pain Descriptors / Indicators: Grimacing;Guarding;Moaning Pain Intervention(s): Monitored during session;Repositioned    Home Living                      Prior Function            PT Goals (current goals can now be found in the care plan section) Acute Rehab PT Goals Patient Stated Goal: decrease pain PT Goal Formulation: With patient/family Time For  Goal Achievement: 10/20/16 Potential to Achieve Goals: Good Progress towards PT goals: Progressing toward goals    Frequency    7X/week      PT Plan Current plan remains appropriate    Co-evaluation             End of Session Equipment Utilized During Treatment: Oxygen (2L via [place]) Activity Tolerance: Patient limited by pain;Patient limited by lethargy Patient left: in bed;with call bell/phone within  reach;with family/visitor present     Time: 4010-2725 PT Time Calculation (min) (ACUTE ONLY): 27 min  Charges:  $Therapeutic Exercise: 23-37 mins                    G CodesAlessandra Bevels Velmer Woelfel Oct 30, 2016, 5:02 PM Deborah Chalk, PT, DPT [phone]

## 2016-10-14 NOTE — Progress Notes (Signed)
Patient was able to only void ~35ml of urine. Attempt to get patient onto [facility]. Patient unable to stand up and bear weight on bil legs. Bladder scan showed ~420. Straight in and out cath done got out 337ml. Will continue to monitor.

## 2016-10-14 NOTE — Progress Notes (Signed)
Inpatient Rehabilitation  Per PT/OT request, patient was screened by Precilla Purnell for appropriateness for an Inpatient Acute Rehab consult.  At this time we are recommending an Inpatient Rehab consult.  Please order if you are agreeable.    Dominie Benedick, M.A., CCC/SLP Admission Coordinator  [facility]  Cell [phone]  

## 2016-10-15 DIAGNOSIS — M170 Bilateral primary osteoarthritis of knee: Principal | ICD-10-CM

## 2016-10-15 LAB — CBC
HCT: 28 % — ABNORMAL LOW (ref 36.0–46.0)
HEMOGLOBIN: 9.5 g/dL — AB (ref 12.0–15.0)
MCH: 30.4 pg (ref 26.0–34.0)
MCHC: 33.9 g/dL (ref 30.0–36.0)
MCV: 89.5 fL (ref 78.0–100.0)
PLATELETS: 193 10*3/uL (ref 150–400)
RBC: 3.13 MIL/uL — ABNORMAL LOW (ref 3.87–5.11)
RDW: 13.5 % (ref 11.5–15.5)
WBC: 8.2 10*3/uL (ref 4.0–10.5)

## 2016-10-15 LAB — TYPE AND SCREEN
ABO/RH(D): A POS
ANTIBODY SCREEN: NEGATIVE
UNIT DIVISION: 0
Unit division: 0

## 2016-10-15 MED ORDER — OXYCODONE-ACETAMINOPHEN 5-325 MG PO TABS: 1.0000 | ORAL_TABLET | ORAL | 0 refills | Status: DC | PRN

## 2016-10-15 MED ORDER — METHOCARBAMOL 750 MG PO TABS: 750.0000 mg | ORAL_TABLET | Freq: Four times a day (QID) | ORAL | 0 refills | Status: DC | PRN

## 2016-10-15 MED ORDER — ASPIRIN 325 MG PO TBEC: 325.0000 mg | DELAYED_RELEASE_TABLET | Freq: Two times a day (BID) | ORAL | 0 refills | Status: DC

## 2016-10-15 NOTE — Progress Notes (Signed)
Physical Therapy Treatment Patient Details Name: Martha Orr MRN: [id number] DOB: November 20, 1956 Today's Date: 10/15/2016    History of Present Illness Pt is a 60 y/o female s/p bilateral TKA's. PMH including but not limited to hypothyroidism, high cholesterol and depression.    PT Comments    Pt presented supine in bed with HOB elevated, awake and willing to participate in therapy session. Pt's husband was present throughout session. Pt was more alert throughout this session as compared to yesterday and made good improvements in ability to participate in functional movements. Pt performed STS with Stedy and max A x2. Pt was transferred to recliner in [place] frame. Pt would continue to benefit from skilled physical therapy services at this time while admitted and after d/c to address her limitations in order to improve her overall safety and independence with functional mobility. Pt continues to be an excellent candidate for CIR and would greatly benefit from further intensive therapy services.   Follow Up Recommendations  CIR;Supervision/Assistance - 24 hour     Equipment Recommendations  None recommended by PT    Recommendations for Other Services Rehab consult     Precautions / Restrictions Precautions Precautions: Knee;Fall Precaution Booklet Issued: Yes (comment) Restrictions Weight Bearing Restrictions: Yes RLE Weight Bearing: Weight bearing as tolerated LLE Weight Bearing: Weight bearing as tolerated    Mobility  Bed Mobility Overal bed mobility: Needs Assistance;+2 for physical assistance Bed Mobility: Supine to Sit     Supine to sit: Mod assist;+2 for physical assistance;HOB elevated     General bed mobility comments: pt required increased time, use of bed rails and mod A x2 at bilateral LEs and upper body to achieve sitting EOB  Transfers Overall transfer level: Needs assistance Equipment used:  Antony Salmon) Transfers: Sit to/from [employer]  to Stand: Max assist;+2 physical assistance;+2 safety/equipment;From elevated surface Stand pivot transfers: Total assist;+2 physical assistance;+2 safety/equipment (in [place])       General transfer comment: Pt successfully performed STS transfer with max A x2, use of bed pad and use of Stedy on second attempt; pt unsuccessful on first attempt  Ambulation/Gait                 Stairs            Wheelchair Mobility    Modified Rankin (Stroke Patients Only)       Balance Overall balance assessment: Needs assistance Sitting-balance support: Feet supported;Bilateral upper extremity supported Sitting balance-Leahy Scale: Poor     Standing balance support: During functional activity;Bilateral upper extremity supported Standing balance-Leahy Scale: Poor Standing balance comment: pt reliant on bilateral UEs on Stedy frame. pt tolerated standing in [place] for approximately 10 minutes and was able to be cleaned up with total A from nurse tech.                    Cognition Arousal/Alertness: Awake/alert Behavior During Therapy: WFL for tasks assessed/performed Overall Cognitive Status: Within Functional Limits for tasks assessed                      Exercises Total Joint Exercises Long Arc Quad: AAROM;Both;10 reps;Seated Knee Flexion: AAROM;Both;10 reps;Seated Goniometric ROM: R Flexion = 70 degrees, R Extension = lacking 15 degrees to neutral; L Flexion = 65 degrees, L Extension = lacking 15 degrees to neutral; measured in sitting and supine    General Comments        Pertinent Vitals/Pain Pain Assessment: Faces Faces Pain  Scale: Hurts even more Pain Location: bilateral knees Pain Descriptors / Indicators: Guarding;Moaning;Sore Pain Intervention(s): Monitored during session;Repositioned    Home Living                      Prior Function            PT Goals (current goals can now be found in the care plan section) Acute Rehab PT  Goals Patient Stated Goal: go to inpatient rehab prior to returning home PT Goal Formulation: With patient/family Time For Goal Achievement: 10/20/16 Potential to Achieve Goals: Good Progress towards PT goals: Progressing toward goals    Frequency    7X/week      PT Plan Current plan remains appropriate    Co-evaluation PT/OT/SLP Co-Evaluation/Treatment: Yes Reason for Co-Treatment: For patient/therapist safety PT goals addressed during session: Mobility/safety with mobility;Balance;Proper use of DME;Strengthening/ROM       End of Session Equipment Utilized During Treatment: Gait belt;Other (comment) Antony Salmon) Activity Tolerance: Patient limited by fatigue;Patient limited by pain Patient left: in chair;with call bell/phone within reach;with family/visitor present;Other (comment) (OT in room finishing up session)     Time: 0272-5366 PT Time Calculation (min) (ACUTE ONLY): 36 min  Charges:  $Therapeutic Activity: 8-22 mins                    G CodesAlessandra Orr Martha Orr 10/19/16, 11:06 AM Deborah Chalk, PT, DPT [phone]

## 2016-10-15 NOTE — Progress Notes (Signed)
Rehab admissions - I met with patient's husband and gave him rehab brochures.  He is open to inpatient rehab admission.  I will open the case with [facility] and request acute inpatient rehab admission.  I will follow up on Monday for progress and plans.  Call me for questions.  #[phone]

## 2016-10-15 NOTE — Progress Notes (Signed)
Subjective: 3 Days Post-Op Procedure(s) (LRB): TOTAL KNEE BILATERAL (Bilateral)   Patient much more alert this morning. Still having some pain but is heading in the right direction. PT/OT recommended inpatient rehab and a consult has been placed.  Activity level:  wbat Diet tolerance:  ok Voiding:  ok Patient reports pain as mild and moderate.    Objective: Vital signs in last 24 hours: Temp:  [98 F (36.7 C)-98.8 F (37.1 C)] 98 F (36.7 C) (10/20 0439) Pulse Rate:  [110-115] 110 (10/20 0439) Resp:  [18] 18 (10/20 0439) BP: (111-143)/(56-70) 114/65 (10/20 0439) SpO2:  [93 %-97 %] 96 % (10/20 0439)  Labs:  Recent Labs  10/14/16 0348 10/15/16 0605  HGB 11.1* 9.5*    Recent Labs  10/14/16 0348 10/15/16 0605  WBC 8.8 8.2  RBC 3.72* 3.13*  HCT 32.5* 28.0*  PLT 170 193    Recent Labs  10/13/16 0912  NA 136  K 3.8  CL 100*  CO2 26  BUN 7  CREATININE 0.61  GLUCOSE 130*  CALCIUM 8.2*   No results for input(s): LABPT, INR in the last 72 hours.  Physical Exam:  Neurologically intact ABD soft Neurovascular intact Sensation intact distally Intact pulses distally Dorsiflexion/Plantar flexion intact Incision: dressing C/D/I and no drainage No cellulitis present Compartment soft  Assessment/Plan:  3 Days Post-Op Procedure(s) (LRB): TOTAL KNEE BILATERAL (Bilateral) Advance diet Up with therapy  Continue on ASA 325mg  BID x 4 weeks post op for DVT prevention. Follow up in office 2 weeks post op. We agree that inpatient Rehab would be great for patient. We will discuss her discharge based on their recommendations.  Continue current pain meds.   Isak Sotomayor, [name] 10/15/2016, 7:57 AM

## 2016-10-15 NOTE — Consult Note (Signed)
Physical Medicine and Rehabilitation Consult Reason for Consult: Bilateral total knee arthroplasty secondary to end-stage osteoarthritis Referring Physician: Dr. Rhona Raider   HPI: Martha Orr is a 60 y.o. right handed female with history of chronic back pain. Presented 10/11/2016 with end-stage osteoarthritis bilateral knees. Per chart review patient lives with spouse. Independent with assistive device prior to admission. One level home with ramped entrance. Husband can assist as needed. Patient had failed nonsurgical conservative treatments. Underwent bilateral total knee replacement 10/12/2016 per Dr. Rhona Raider. Hospital course pain management. Maintained on aspirin 325 mg twice a day for DVT prophylaxis. Weightbearing as tolerated bilateral lower extremities. Physical and occupational therapy evaluations completed with recommendations of physical medicine rehabilitation consult.   Review of Systems  Constitutional: Negative for chills and fever.  HENT: Negative for hearing loss.   Eyes: Negative for blurred vision and double vision.  Respiratory: Negative for cough and shortness of breath.   Cardiovascular: Negative for chest pain, palpitations and leg swelling.  Gastrointestinal: Positive for constipation. Negative for nausea and vomiting.  Genitourinary: Negative for dysuria and flank pain.  Musculoskeletal: Positive for back pain and myalgias.  Skin: Negative for rash.  Neurological: Positive for headaches. Negative for seizures.  Psychiatric/Behavioral: Positive for depression.  All other systems reviewed and are negative.  Past Medical History:  Diagnosis Date  . Arthritis   . Chronic back pain    buldging disc  . Depression    takes Lexapro daily  . Family history of adverse reaction to anesthesia    sister gets sick after anesthesia  . High cholesterol    takes Crestor daily  . History of colon polyps    benign  . History of shingles   . Hypothyroidism    takes Synthroid daily  . Joint pain   . Joint swelling   . Microscopic hematuria    states her entire life and family is the same way   Past Surgical History:  Procedure Laterality Date  . ABDOMINAL HYSTERECTOMY    . APPENDECTOMY    . BUNIONECTOMY Bilateral   . CARPAL TUNNEL RELEASE Bilateral   . CHOLECYSTECTOMY    . COLONOSCOPY    . KNEE ARTHROSCOPY Right   . knot removed from right breast    . NASAL SINUS SURGERY    . TONSILLECTOMY    . TOTAL KNEE ARTHROPLASTY Bilateral 10/12/2016   Procedure: TOTAL KNEE BILATERAL;  Surgeon: Melrose Nakayama, MD;  Location: [facility];  Service: Orthopedics;  Laterality: Bilateral;   History reviewed. No pertinent family history. Social History:  reports that she has never smoked. She has never used smokeless tobacco. She reports that she drinks alcohol. She reports that she does not use drugs. Allergies:  Allergies  Allergen Reactions  . No Known Allergies    Medications Prior to Admission  Medication Sig Dispense Refill  . escitalopram (LEXAPRO) 10 MG tablet Take 10 mg by mouth daily.    Marland Kitchen ibuprofen (ADVIL,MOTRIN) 200 MG tablet Take 400 mg by mouth every 4 (four) hours as needed for moderate pain.    Marland Kitchen levothyroxine (SYNTHROID, LEVOTHROID) 112 MCG tablet Take 112 mcg by mouth every evening.     . meloxicam (MOBIC) 15 MG tablet Take 15 mg by mouth daily.    . rosuvastatin (CRESTOR) 20 MG tablet Take 20 mg by mouth daily.    Marland Kitchen HYDROcodone-acetaminophen (NORCO/VICODIN) 5-325 MG per tablet 1 or 2 tabs PO q6 hours prn pain (Patient not taking: Reported on 09/30/2016) 20  tablet 0    Home: Home Living Family/patient expects to be discharged to:: Private residence Living Arrangements: Spouse/significant other Available Help at Discharge: Family, Available 24 hours/day Type of Home: House Home Access: Ramped entrance Home Layout: One level Bathroom Shower/Tub: Multimedia programmer: Standard Home Equipment: Environmental consultant - 2 wheels, Shower seat,  Bedside commode  Functional History: Prior Function Level of Independence: Independent Functional Status:  Mobility: Bed Mobility Overal bed mobility: Needs Assistance, +2 for physical assistance Bed Mobility: Supine to Sit, Sit to Supine Supine to sit: Max assist, +2 for physical assistance, HOB elevated Sit to supine: Max assist, +2 for physical assistance General bed mobility comments: pt agreeable to participate in bed level therapeutic exercises Transfers General transfer comment: attempted STS x3 with Stedy and x2 max A; unable to achieve standing from bed. pt limited secondary to pain and lethargy. Ambulation/Gait General Gait Details: please see above comment    ADL: ADL Overall ADL's : Needs assistance/impaired Eating/Feeding: Set up, Bed level, Supervision/ safety Grooming: Set up, Bed level, Supervision/safety Upper Body Bathing: Supervision/ safety, Set up, Bed level Lower Body Bathing: Minimal assistance, Bed level Upper Body Dressing : Set up, Bed level, Supervision/safety Lower Body Dressing: Maximal assistance, Bed level Toileting- Clothing Manipulation and Hygiene: Bed level, Set up, Supervision/safety Functional mobility during ADLs:  (Attempted sit<>stand with max assist +2; unable to complete) General ADL Comments: Pt educated on ADL post-operatively. Pt limited by lethargy and pain this date. Attempted sit<>stand with max assist +2 with Stedy and unable to complete. All ADL assessed at bed level due to pt's decreased mobility status.  Cognition: Cognition Overall Cognitive Status: Impaired/Different from baseline Orientation Level: Oriented X4 Cognition Arousal/Alertness: Lethargic, Suspect due to medications Behavior During Therapy: WFL for tasks assessed/performed, Anxious Overall Cognitive Status: Impaired/Different from baseline Area of Impairment: Attention, Memory, Following commands, Problem solving Current Attention Level: Selective Memory:  Decreased short-term memory Following Commands: Follows one step commands inconsistently, Follows one step commands with increased time Problem Solving: Slow processing, Decreased initiation, Requires verbal cues, Requires tactile cues General Comments: suspect due to medications  Blood pressure 114/65, pulse (!) 110, temperature 98 F (36.7 C), temperature source Oral, resp. rate 18, height 5' 5.5" (1.664 m), weight 89.8 kg (198 lb), SpO2 96 %. Physical Exam  Vitals reviewed. Constitutional: She appears well-developed.  HENT:  Head: Normocephalic.  Eyes: EOM are normal.  Neck: Normal range of motion. Neck supple. No thyromegaly present.  Cardiovascular: Normal rate and regular rhythm.   Respiratory: Effort normal and breath sounds normal. No respiratory distress.  GI: Soft. Bowel sounds are normal. She exhibits no distension.  Musculoskeletal:  Edema bilateral knees, incisions appear clean. Post-op dressings in place  Neurological: She displays normal reflexes. No cranial nerve deficit. She exhibits normal muscle tone.  Mood is flat but appropriate. Oriented to person, place and date of birth. Follows simple commands. UE 5/5. LE: 2/5 HF and 2-/5 KE (can't begin straight leg raise yet). 4/5 bilateral ADF/PF  Skin:  Bilateral knee incisions are dressed appropriately tender.  Psychiatric: She has a normal mood and affect. Her behavior is normal.    No results found for this or any previous visit (from the past 24 hour(s)). Dg Chest Port 1 View  Result Date: 10/14/2016 CLINICAL DATA:  Cough.  Recent total knee replacement EXAM: PORTABLE CHEST 1 VIEW COMPARISON:  October 04, 2016 FINDINGS: There is no edema or consolidation. The heart size and pulmonary vascularity are normal. No adenopathy. There is  atherosclerotic calcification in aorta. No bone lesions. IMPRESSION: Aortic atherosclerosis.  No edema or consolidation. Electronically Signed   By: Lowella Grip III M.D.   On: 10/14/2016  13:30    Assessment/Plan: Diagnosis: endstage OA s/p bilateral TKA's 1. Does the need for close, 24 hr/day medical supervision in concert with the patient's rehab needs make it unreasonable for this patient to be served in a less intensive setting? Yes 2. Co-Morbidities requiring supervision/potential complications: pain mgt, wound care, post-op anemia 3. Due to bladder management, bowel management, safety, skin/wound care, disease management, medication administration, pain management and patient education, does the patient require 24 hr/day rehab nursing? Yes 4. Does the patient require coordinated care of a physician, rehab nurse, PT (1-2 hrs/day, 5 days/week) and OT (1-2 hrs/day, 5 days/week) to address physical and functional deficits in the context of the above medical diagnosis(es)? Yes Addressing deficits in the following areas: balance, endurance, locomotion, strength, transferring, bowel/bladder control, bathing, dressing, feeding, grooming, toileting and psychosocial support 5. Can the patient actively participate in an intensive therapy program of at least 3 hrs of therapy per day at least 5 days per week? Yes 6. The potential for patient to make measurable gains while on inpatient rehab is excellent 7. Anticipated functional outcomes upon discharge from inpatient rehab are modified independent  with PT, modified independent with OT, n/a with SLP. 8. Estimated rehab length of stay to reach the above functional goals is: 7 days 9. Does the patient have adequate social supports and living environment to accommodate these discharge functional goals? Yes 10. Anticipated D/C setting: Home 11. Anticipated post D/C treatments: [place] therapy 12. Overall Rehab/Functional Prognosis: excellent  RECOMMENDATIONS: This patient's condition is appropriate for continued rehabilitative care in the following setting: CIR Patient has agreed to participate in recommended program. Yes Note that insurance  prior authorization may be required for reimbursement for recommended care.  Comment: Pt with ongoing pain and substantial knee extensor weakness. Would benefit from our intensive program. Rehab Admissions Coordinator to follow up.  Thanks,  Meredith Staggers, MD, Mellody Drown     10/15/2016

## 2016-10-15 NOTE — Progress Notes (Signed)
Occupational Therapy Treatment Patient Details Name: Martha Orr MRN: [id number] DOB: 14-Oct-1956 Today's Date: 10/15/2016    History of present illness Pt is a 60 y/o female s/p bilateral TKA's. PMH including but not limited to hypothyroidism, high cholesterol and depression.   OT comments  Pt more alert during session this date and with good improvement in functional mobility. Able to perform sit<>stand for pericare and ADL transfer with max assist x2 with [facility] and total assist for pericare. Pt was able to complete grooming tasks while seated as opposed to bed level and was able to reach to lower leg to assist with LB ADL tasks. Pt would continue to benefit from continued OT services while admitted in order to improve independence with ADL. Pt continues to be an excellent candidate for CIR for intensive further rehabilitation to improve independence with ADL. OT will continue to follow acutely.   Follow Up Recommendations  CIR;Supervision/Assistance - 24 hour;Other (comment)    Equipment Recommendations  None recommended by OT       Precautions / Restrictions Precautions Precautions: Knee;Fall Precaution Booklet Issued: No Restrictions Weight Bearing Restrictions: Yes RLE Weight Bearing: Weight bearing as tolerated LLE Weight Bearing: Weight bearing as tolerated       Mobility Bed Mobility Overal bed mobility: Needs Assistance;+2 for physical assistance Bed Mobility: Supine to Sit     Supine to sit: Mod assist;+2 for physical assistance;HOB elevated     General bed mobility comments: pt required increased time, use of bed rails and mod A x2 at bilateral LEs and upper body to achieve sitting EOB  Transfers Overall transfer level: Needs assistance Equipment used: None (Stedy) Transfers: Sit to/from Stand Sit to Stand: Max assist;+2 physical assistance;+2 safety/equipment;From elevated surface Stand pivot transfers: Total assist;+2 physical assistance;+2  safety/equipment (in [place])       General transfer comment: Pt successfully performed STS transfer with max A x2, use of bed pad and use of Stedy on second attempt; pt unsuccessful on first attempt    Balance Overall balance assessment: Needs assistance Sitting-balance support: Feet supported;Single extremity supported Sitting balance-Leahy Scale: Poor Sitting balance - Comments: Able to sit at EOB with min guard assist for safety. Able to briefly remove single UE from bed.   Standing balance support: During functional activity;Bilateral upper extremity supported Standing balance-Leahy Scale: Poor Standing balance comment: Pt tolerated standing in Stedy for approximately 10 minutes with max assist x2 and total assist for pericare.                   ADL Overall ADL's : Needs assistance/impaired     Grooming: Brushing hair;Set up;Sitting               Lower Body Dressing: Maximal assistance;Sit to/from stand   Toilet Transfer: Maximal assistance;+2 for physical assistance (With [employer])   Toileting- Clothing Manipulation and Hygiene: +2 for physical assistance;Sit to/from stand;Total assistance Toileting - Clothing Manipulation Details (indicate cue type and reason): Max x2 for sit<>stand with Stedy and total assist once standing for pericare.     Functional mobility during ADLs: Maximal assistance;+2 for physical assistance (With Stedy) General ADL Comments: Pt with improved ability to complete sit<>stand into Stedy with Max assist x2. Addressed grooming tasks to brush and style hair with set-up while sitting. Pt with improved ability to tolerate sitting for ADL and was able to reach to her lower leg to assist with ADL.  Cognition   Behavior During Therapy: WFL for tasks assessed/performed Overall Cognitive Status: Within Functional Limits for tasks assessed Area of Impairment: Attention;Memory;Following commands;Problem solving   Current Attention  Level: Selective Memory: Decreased short-term memory  Following Commands: Follows one step commands inconsistently;Follows one step commands with increased time     Problem Solving: Slow processing;Decreased initiation;Requires verbal cues;Requires tactile cues General Comments: suspect due to medications      Exercises Total Joint Exercises Long Arc Quad: AAROM;Both;10 reps;Seated Knee Flexion: AAROM;Both;10 reps;Seated Goniometric ROM: R Flexion = 70 degrees, R Extension = lacking 15 degrees to neutral; L Flexion = 65 degrees, L Extension = lacking 15 degrees to neutral; measured in sitting and supine           Pertinent Vitals/ Pain       Pain Assessment: Faces Faces Pain Scale: Hurts even more Pain Location: bilateral knees Pain Descriptors / Indicators: Guarding;Moaning;Sore Pain Intervention(s): Monitored during session;Repositioned         Frequency  Min 2X/week        Progress Toward Goals  OT Goals(current goals can now be found in the care plan section)  Progress towards OT goals: Progressing toward goals  Acute Rehab OT Goals Patient Stated Goal: go to inpatient rehab prior to returning home OT Goal Formulation: With patient/family Time For Goal Achievement: 10/28/16 Potential to Achieve Goals: Fair ADL Goals Pt Will Perform Upper Body Bathing: with modified independence;sitting Pt Will Perform Lower Body Bathing: with mod assist;sit to/from stand Pt Will Perform Upper Body Dressing: with modified independence;sitting Pt Will Perform Lower Body Dressing: with mod assist;sit to/from stand Pt Will Transfer to Toilet: with mod assist;bedside commode;ambulating Pt Will Perform Toileting - Clothing Manipulation and hygiene: with mod assist;sit to/from stand Pt Will Perform Tub/Shower Transfer: with max assist;rolling walker;3 in 1  Plan Discharge plan remains appropriate    Co-evaluation    PT/OT/SLP Co-Evaluation/Treatment: Yes Reason for Co-Treatment:  For patient/therapist safety PT goals addressed during session: Mobility/safety with mobility;Balance;Proper use of DME OT goals addressed during session: ADL's and self-care      End of Session Equipment Utilized During Treatment: Gait belt;Other (comment) Charlaine Dalton)   Activity Tolerance Patient limited by lethargy;Patient limited by pain   Patient Left in chair;with call bell/phone within reach;with family/visitor present   Nurse Communication Other (comment) (Need for 3 nurse techs for mobility with Stedy)        [id number] OT Time Calculation (min): 35 min  Charges: OT General Charges $OT Visit: 1 Procedure OT Treatments $Self Care/Home Management : 8-22 mins  Norman Herrlich, OTR/L [phone] 10/15/2016, 1:31 PM

## 2016-10-16 NOTE — Progress Notes (Signed)
Physical Therapy Treatment Patient Details Name: Martha Orr MRN: [id number] DOB: May 09, 1956 Today's Date: 10/16/2016    History of Present Illness Pt is a 60 y/o female s/p bilateral TKA's. PMH including but not limited to hypothyroidism, high cholesterol and depression.    PT Comments    Pt presented sitting OOB in recliner when PT entered room. Pt making good progress towards achieving her functional goals. Pt would continue to benefit from skilled physical therapy services at this time while admitted and after d/c to address her limitations in order to improve her overall safety and independence with functional mobility. Pt continues to be an excellent candidate for CIR.   Follow Up Recommendations  CIR;Supervision/Assistance - 24 hour     Equipment Recommendations  None recommended by PT    Recommendations for Other Services Rehab consult     Precautions / Restrictions Precautions Precautions: Knee;Fall Restrictions Weight Bearing Restrictions: Yes RLE Weight Bearing: Weight bearing as tolerated LLE Weight Bearing: Weight bearing as tolerated    Mobility  Bed Mobility Overal bed mobility: Needs Assistance Bed Mobility: Sit to Supine       Sit to supine: Mod assist   General bed mobility comments: pt required mod A with bilateral LE movement back onto bed. pt used bed rails to reposition in bed once supine.  Transfers Overall transfer level: Needs assistance Equipment used: Rolling walker (2 wheeled) Transfers: Sit to/from Stand Sit to Stand: Min assist         General transfer comment: pt demonstrated good hand placement. pt performed STS x1 from recliner and x1 from toilet with 3-in-1 on top.  Ambulation/Gait Ambulation/Gait assistance: Min assist Ambulation Distance (Feet): 50 Feet (50' x1 and 15' x1 ) Assistive device: Rolling walker (2 wheeled) Gait Pattern/deviations: Step-to pattern;Decreased step length - right;Decreased step length -  left;Decreased stride length;Antalgic;Trunk flexed Gait velocity: decreased Gait velocity interpretation: Below normal speed for age/gender General Gait Details: Pt required VC'ing for safe distance with RW as pt preferred to keep RW too close to herself. Pt ambulated from recliner to toilet x15' and then ambulated ~50' after voiding.   Stairs            Wheelchair Mobility    Modified Rankin (Stroke Patients Only)       Balance Overall balance assessment: Needs assistance Sitting-balance support: Feet supported;Bilateral upper extremity supported Sitting balance-Leahy Scale: Poor     Standing balance support: During functional activity;Bilateral upper extremity supported Standing balance-Leahy Scale: Poor Standing balance comment: pt reliant on bilateral UEs on RW during standing and ambulation                    Cognition Arousal/Alertness: Awake/alert Behavior During Therapy: WFL for tasks assessed/performed Overall Cognitive Status: Within Functional Limits for tasks assessed                      Exercises Total Joint Exercises Ankle Circles/Pumps: AROM;Both;Supine;10 reps Quad Sets: AROM;Both;10 reps;Supine Heel Slides: AAROM;Both;10 reps;Supine    General Comments        Pertinent Vitals/Pain Pain Assessment: Faces Faces Pain Scale: Hurts little more Pain Location: bilateral knees (L>R) Pain Descriptors / Indicators: Sore;Grimacing;Guarding Pain Intervention(s): Monitored during session;Repositioned;Ice applied    Home Living                      Prior Function            PT Goals (current goals can now  be found in the care plan section) Acute Rehab PT Goals Patient Stated Goal: go to inpatient rehab prior to returning home PT Goal Formulation: With patient/family Time For Goal Achievement: 10/20/16 Potential to Achieve Goals: Good Progress towards PT goals: Progressing toward goals    Frequency    7X/week       PT Plan Current plan remains appropriate    Co-evaluation             End of Session Equipment Utilized During Treatment: Gait belt Activity Tolerance: Patient limited by fatigue;Patient limited by pain Patient left: in bed;with call bell/phone within reach;with family/visitor present     Time: [id number] PT Time Calculation (min) (ACUTE ONLY): 42 min  Charges:  $Gait Training: 8-22 mins $Therapeutic Exercise: 8-22 mins $Therapeutic Activity: 8-22 mins                    G CodesAlessandra Orr Martha Orr 11/14/16, 4:28 PM Martha Orr, PT, DPT [phone]

## 2016-10-16 NOTE — Progress Notes (Signed)
PT is recommending inpt rehab. Awaiting for insurance approval. Will continue to f/u to assist with the d/c plan.

## 2016-10-16 NOTE — Progress Notes (Signed)
Physical Therapy Treatment Patient Details Name: Martha Orr MRN: [id number] DOB: [birth year] Today's Date: 10/16/2016    History of Present Illness Pt is a 60 y/o female s/p bilateral TKA's. PMH including but not limited to hypothyroidism, high cholesterol and depression.    PT Comments    Pt performed increased mobility progressing to gait training. Reviewed exercises with patient and will f/u in pm to advance mobility.    Follow Up Recommendations  CIR;Supervision/Assistance - 24 hour     Equipment Recommendations  None recommended by PT    Recommendations for Other Services Rehab consult     Precautions / Restrictions Precautions Precautions: Knee;Fall Precaution Comments: PT discussed positioning of LEs following TKA with pt and pt's spouse. Restrictions Weight Bearing Restrictions: Yes RLE Weight Bearing: Weight bearing as tolerated LLE Weight Bearing: Weight bearing as tolerated    Mobility  Bed Mobility Overal bed mobility: Needs Assistance;+2 for physical assistance Bed Mobility: Supine to Sit     Supine to sit: Mod assist     General bed mobility comments: Pt required assist to advance to edge of bed.  Head of bed elevated with heavy use of rail.    Transfers   Equipment used: Rolling walker (2 wheeled) Transfers: Sit to/from Stand Sit to Stand: Mod assist;+2 physical assistance         General transfer comment: Cues for hand placement.  Pt initially reaches for front of rolling walker to pull up from seated surface.  Pt educated on hand placement.    Ambulation/Gait Ambulation/Gait assistance: Min assist;Mod assist Ambulation Distance (Feet): 68 Feet Assistive device: Rolling walker (2 wheeled) Gait Pattern/deviations: Step-to pattern;Step-through pattern;Shuffle;Decreased stride length;Trunk flexed;Antalgic   Gait velocity interpretation: Below normal speed for age/gender General Gait Details: Cues for sequencing, upper trunk control, and  RW safety.  Pt initially required assist to advance RW forward.     Stairs            Wheelchair Mobility    Modified Rankin (Stroke Patients Only)       Balance Overall balance assessment: Needs assistance   Sitting balance-Leahy Scale: Poor Sitting balance - Comments: Able to sit at EOB with min guard assist for safety. Able to briefly remove single UE from bed.     Standing balance-Leahy Scale: Poor                      Cognition Arousal/Alertness: Awake/alert Behavior During Therapy: WFL for tasks assessed/performed Overall Cognitive Status: Within Functional Limits for tasks assessed Area of Impairment: Attention;Memory;Following commands;Problem solving   Current Attention Level: Selective Memory: Decreased short-term memory Following Commands: Follows one step commands inconsistently;Follows one step commands with increased time     Problem Solving: Slow processing;Decreased initiation;Requires verbal cues;Requires tactile cues General Comments: suspect due to medications    Exercises Total Joint Exercises Ankle Circles/Pumps: AROM;Both;Supine;10 reps Quad Sets: AROM;Both;10 reps;Supine Heel Slides: AAROM;Both;10 reps;Supine Hip ABduction/ADduction: AAROM;Both;10 reps;Supine Straight Leg Raises: AAROM;Both;10 reps;Supine    General Comments        Pertinent Vitals/Pain Pain Assessment: 0-10 Pain Score: 6  Pain Location: Bilateral Knees.   Pain Descriptors / Indicators: Grimacing;Guarding Pain Intervention(s): Monitored during session;Repositioned;Ice applied    Home Living                      Prior Function            PT Goals (current goals can now be found in the care plan  section) Acute Rehab PT Goals Patient Stated Goal: go to inpatient rehab prior to returning home Potential to Achieve Goals: Good Progress towards PT goals: Progressing toward goals    Frequency    7X/week      PT Plan Current plan remains  appropriate    Co-evaluation             End of Session Equipment Utilized During Treatment: Gait belt Activity Tolerance: Patient limited by fatigue;Patient limited by pain Patient left: in chair;with call bell/phone within reach;with family/visitor present     Time: [id number] PT Time Calculation (min) (ACUTE ONLY): 38 min  Charges:  $Gait Training: 8-22 mins $Therapeutic Exercise: 8-22 mins $Therapeutic Activity: 8-22 mins                    G Codes:      Cristela Blue 2016-10-23, 10:28 AM  Governor Rooks, PTA pager [phone]

## 2016-10-16 NOTE — Progress Notes (Signed)
Subjective: 4 Days Post-Op Procedure(s) (LRB): TOTAL KNEE BILATERAL (Bilateral)   Awake, alert, oriented well today.  Sitting in chair.  Waiting on placement, dependent upon insurance, etc. Activity level:  wbat Diet tolerance:  ok Voiding:  ok Patient reports pain as mild and moderate.    Objective: Vital signs in last 24 hours: Temp:  [98.7 F (37.1 C)] 98.7 F (37.1 C) (10/20 2006) Pulse Rate:  [109-112] 109 (10/20 2006) Resp:  [17] 17 (10/20 1457) BP: (116-129)/(60-78) 129/78 (10/20 2006) SpO2:  [95 %-97 %] 95 % (10/20 2006)  Labs:  Recent Labs  10/14/16 0348 10/15/16 0605  HGB 11.1* 9.5*    Recent Labs  10/14/16 0348 10/15/16 0605  WBC 8.8 8.2  RBC 3.72* 3.13*  HCT 32.5* 28.0*  PLT 170 193   No results for input(s): NA, K, CL, CO2, BUN, CREATININE, GLUCOSE, CALCIUM in the last 72 hours. No results for input(s): LABPT, INR in the last 72 hours.  Physical Exam:  Neurologically intact ABD soft Neurovascular intact Sensation intact distally Intact pulses distally Dorsiflexion/Plantar flexion intact Incision: dressing C/D/I and no drainage No cellulitis present Compartment soft  Assessment/Plan:  4 Days Post-Op Procedure(s) (LRB): TOTAL KNEE BILATERAL (Bilateral) Advance diet Up with therapy  Continue on ASA 325mg  BID x 4 weeks post op for DVT prevention. Follow up in office 2 weeks post op. Waiting on disposition decision--medically ready for d/c to appropriate place once determined Continue current pain meds.   Martha Orr A. 10/16/2016, 10:28 AM

## 2016-10-17 NOTE — Progress Notes (Signed)
Subjective: 5 Days Post-Op Procedure(s) (LRB): TOTAL KNEE BILATERAL (Bilateral)   Awake, alert, oriented well today.  Reclining in bed.  +BM Activity level:  wbat Diet tolerance:  ok Voiding:  ok Patient reports pain as mild and moderate.   Thinking she may be well-enough to go home on Monday rather then CIR.  Objective: Vital signs in last 24 hours: Temp:  [97.9 F (36.6 C)-99.3 F (37.4 C)] 98.4 F (36.9 C) (10/22 0719) Pulse Rate:  [90-94] 90 (10/22 0719) Resp:  [16] 16 (10/21 2100) BP: (123-126)/(69) 126/69 (10/21 2100) SpO2:  [95 %-100 %] 99 % (10/22 0719)  Labs:  Recent Labs  10/15/16 0605  HGB 9.5*    Recent Labs  10/15/16 0605  WBC 8.2  RBC 3.13*  HCT 28.0*  PLT 193   No results for input(s): NA, K, CL, CO2, BUN, CREATININE, GLUCOSE, CALCIUM in the last 72 hours. No results for input(s): LABPT, INR in the last 72 hours.  Physical Exam:  Neurologically intact ABD soft Neurovascular intact Sensation intact distally Intact pulses distally Dorsiflexion/Plantar flexion intact Incision: dressing C/D/I and no drainage No cellulitis present Compartment soft  Assessment/Plan:  5 Days Post-Op Procedure(s) (LRB): TOTAL KNEE BILATERAL (Bilateral) Advance diet Up with therapy  Continue on ASA 325mg  BID x 4 weeks post op for DVT prevention. Follow up in office 2 weeks post op. Continue current pain meds.  May see if clears PT for d/c home on Monday vs. Continued consideration for CIR  Martha Orr A. 10/17/2016, 11:49 AM

## 2016-10-17 NOTE — Progress Notes (Signed)
Physical Therapy Treatment Patient Details Name: Martha Orr MRN: [id number] DOB: 1956-05-24 Today's Date: 10/17/2016    History of Present Illness Pt is a 60 y/o female s/p bilateral TKA's. PMH including but not limited to hypothyroidism, high cholesterol and depression.    PT Comments    Pt performed increased gait remains to require min/ mod assist with mobility.  Pt will continue to benefit from inpatient rehab before returning home to improve strength and functional mobility.    Follow Up Recommendations  CIR;Supervision/Assistance - 24 hour     Equipment Recommendations  None recommended by PT    Recommendations for Other Services       Precautions / Restrictions Precautions Precautions: Knee;Fall Precaution Comments: PT discussed positioning of LEs following TKA with pt and pt's spouse. Restrictions Weight Bearing Restrictions: Yes RLE Weight Bearing: Weight bearing as tolerated LLE Weight Bearing: Weight bearing as tolerated    Mobility  Bed Mobility Overal bed mobility: Needs Assistance Bed Mobility: Sit to Supine     Supine to sit: Mod assist     General bed mobility comments: Pt required mod assist to advance B LEs to edge of bed and walk LEs out in prep for transfer training.   Transfers Overall transfer level: Needs assistance Equipment used: Rolling walker (2 wheeled) Transfers: Sit to/from Stand Sit to Stand: Mod assist         General transfer comment: Cues for hand placement, forward weight shifting, and assist to boost into standing position.  Pt off balanced and flexed at hips requiring cues for extension of upper trunk and B hips.    Ambulation/Gait Ambulation/Gait assistance: Min assist Ambulation Distance (Feet): 60 Feet Assistive device: Rolling walker (2 wheeled) Gait Pattern/deviations: Step-through pattern;Trunk flexed;Decreased stride length;Antalgic     General Gait Details: Cues for scapular retraction and forward gaze,  cues for upper trunk control.  Pt required cues for safety and turns.     Stairs            Wheelchair Mobility    Modified Rankin (Stroke Patients Only)       Balance Overall balance assessment: Needs assistance   Sitting balance-Leahy Scale: Poor Sitting balance - Comments: Once feet assisted to floor sitting balance drastically improves.       Standing balance-Leahy Scale: Fair                      Cognition Arousal/Alertness: Awake/alert Behavior During Therapy: WFL for tasks assessed/performed Overall Cognitive Status: Within Functional Limits for tasks assessed                      Exercises Total Joint Exercises Ankle Circles/Pumps: AROM;Both;Supine;10 reps Quad Sets: AROM;Both;10 reps;Supine Heel Slides: AAROM;Both;10 reps;Supine Hip ABduction/ADduction: AAROM;Both;10 reps;Supine Straight Leg Raises: AAROM;Both;10 reps;Supine Goniometric ROM: B knee flexion measures at 72 degrees.      General Comments        Pertinent Vitals/Pain Pain Assessment: 0-10 Pain Location: B knees Pain Descriptors / Indicators: Sore;Grimacing;Guarding Pain Intervention(s): Monitored during session;Repositioned;Ice applied    Home Living                      Prior Function            PT Goals (current goals can now be found in the care plan section) Acute Rehab PT Goals Patient Stated Goal: go to inpatient rehab prior to returning home Potential to Achieve Goals: Good Progress towards  PT goals: Progressing toward goals    Frequency    7X/week      PT Plan Current plan remains appropriate    Co-evaluation             End of Session Equipment Utilized During Treatment: Gait belt Activity Tolerance: Patient limited by fatigue;Patient limited by pain Patient left: in bed;with call bell/phone within reach;with family/visitor present     Time: [id number] PT Time Calculation (min) (ACUTE ONLY): 30 min  Charges:  $Gait  Training: 8-22 mins $Therapeutic Exercise: 8-22 mins                    G Codes:      Cristela Blue November 12, 2016, 9:52 AM  Governor Rooks, PTA pager [phone]

## 2016-10-17 NOTE — Progress Notes (Signed)
Physical Therapy Treatment Patient Details Name: Martha Orr MRN: [id number] DOB: 29-Jul-1956 Today's Date: 10/17/2016    History of Present Illness Pt is a 60 y/o female s/p bilateral TKA's. PMH including but not limited to hypothyroidism, high cholesterol and depression.    PT Comments    Pt progressing well with therapy.  Will continue PT per POC.  Will inform supervising PT of patient's progress.    Follow Up Recommendations  CIR;Supervision/Assistance - 24 hour     Equipment Recommendations  None recommended by PT    Recommendations for Other Services Rehab consult     Precautions / Restrictions Precautions Precautions: Knee;Fall Precaution Comments: PT discussed positioning of LEs following TKA with pt and pt's spouse.    Mobility  Bed Mobility               General bed mobility comments: Pt received in recliner on arrival.    Transfers Overall transfer level: Needs assistance Equipment used: Rolling walker (2 wheeled) Transfers: Sit to/from Stand Sit to Stand: Min guard         General transfer comment: Pt presents with improve technique, min guard for safety, improved weight shifting forward.    Ambulation/Gait Ambulation/Gait assistance: Min guard Ambulation Distance (Feet): 110 Feet Assistive device: Rolling walker (2 wheeled) Gait Pattern/deviations: Step-through pattern;Decreased stride length;Trunk flexed Gait velocity: decreased   General Gait Details: Cues for scapular retraction and forward gaze, cues for upper trunk control.  Pt required cues for safety and turns.     Stairs            Wheelchair Mobility    Modified Rankin (Stroke Patients Only)       Balance     Sitting balance-Leahy Scale: Good       Standing balance-Leahy Scale: Fair                      Cognition Arousal/Alertness: Awake/alert Behavior During Therapy: WFL for tasks assessed/performed                        Exercises  Total Joint Exercises Ankle Circles/Pumps: AROM;Both;Supine;10 reps Quad Sets: AROM;Both;10 reps;Supine Towel Squeeze: AROM;Both;10 reps;Supine Short Arc Quad: AAROM;Both;10 reps;Supine Heel Slides: AAROM;Both;10 reps;Supine Hip ABduction/ADduction: AAROM;Both;10 reps;Supine Straight Leg Raises: AAROM;Both;10 reps;Supine Long Arc Quad: AAROM;Both;10 reps;Seated    General Comments        Pertinent Vitals/Pain Pain Assessment: Faces Faces Pain Scale: Hurts little more Pain Location: B knees Pain Descriptors / Indicators: Operative site guarding;Guarding;Grimacing;Sore Pain Intervention(s): Monitored during session;Repositioned;Ice applied    Home Living                      Prior Function            PT Goals (current goals can now be found in the care plan section) Acute Rehab PT Goals Patient Stated Goal: to go home tomorrow.   Potential to Achieve Goals: Good Progress towards PT goals: Progressing toward goals    Frequency    7X/week      PT Plan Current plan remains appropriate    Co-evaluation             End of Session Equipment Utilized During Treatment: Gait belt Activity Tolerance: Patient limited by fatigue;Patient limited by pain Patient left: in bed;with call bell/phone within reach;with family/visitor present     Time: [id number] PT Time Calculation (min) (ACUTE ONLY): 26 min  Charges:  $  Gait Training: 8-22 mins $Therapeutic Exercise: 8-22 mins                    G Codes:      Cristela Blue 2016-10-29, 2:39 PM  Governor Rooks, PTA pager [phone]

## 2016-10-18 NOTE — Progress Notes (Signed)
Patient was given discharge instructions with husband at bed side IV discontinued home with equipment  Through [facility] given with understanding

## 2016-10-18 NOTE — Discharge Summary (Signed)
Patient ID: Martha Orr MRN: [id number] DOB/AGE: March 27, 1956 60 y.o.  Admit date: 10/12/2016 Discharge date: 10/18/2016  Admission Diagnoses:  Principal Problem:   Bilateral primary osteoarthritis of knee   Discharge Diagnoses:  Same  Past Medical History:  Diagnosis Date  . Arthritis   . Chronic back pain    buldging disc  . Depression    takes Lexapro daily  . Family history of adverse reaction to anesthesia    sister gets sick after anesthesia  . High cholesterol    takes Crestor daily  . History of colon polyps    benign  . History of shingles   . Hypothyroidism    takes Synthroid daily  . Joint pain   . Joint swelling   . Microscopic hematuria    states her entire life and family is the same way    Surgeries: Procedure(s): TOTAL KNEE BILATERAL on 10/12/2016   Consultants:   Discharged Condition: Improved  Hospital Course: Martha Orr is an 60 y.o. female who was admitted 10/12/2016 for operative treatment ofBilateral primary osteoarthritis of knee. Patient has severe unremitting pain that affects sleep, daily activities, and work/hobbies. After pre-op clearance the patient was taken to the operating room on 10/12/2016 and underwent  Procedure(s): TOTAL KNEE BILATERAL.    Patient was given perioperative antibiotics: Anti-infectives    Start     Dose/Rate Route Frequency Ordered Stop   10/13/16 0800  ceFAZolin (ANCEF) IVPB 2g/100 mL premix     2 g 200 mL/hr over 30 Minutes Intravenous Every 6 hours 10/13/16 0754 10/13/16 1318   10/12/16 0930  ceFAZolin (ANCEF) IVPB 2g/100 mL premix     2 g 200 mL/hr over 30 Minutes Intravenous On call to O.R. 10/12/16 0813 10/12/16 1030   10/12/16 0816  ceFAZolin (ANCEF) 2-4 GM/100ML-% IVPB    Comments:  Starleen Arms   : cabinet override      10/12/16 0816 10/12/16 1030       Patient was given sequential compression devices, early ambulation, and chemoprophylaxis to prevent DVT.  Patient benefited maximally  from hospital stay and there were no complications.    Recent vital signs: Patient Vitals for the past 24 hrs:  BP Temp Temp src Pulse Resp SpO2  10/18/16 0442 132/68 98.2 F (36.8 C) Oral 77 16 98 %  10/17/16 2017 123/76 97.7 F (36.5 C) Oral 92 16 96 %  10/17/16 1300 126/70 98.4 F (36.9 C) Oral 83 - 100 %     Recent laboratory studies: No results for input(s): WBC, HGB, HCT, PLT, NA, K, CL, CO2, BUN, CREATININE, GLUCOSE, INR, CALCIUM in the last 72 hours.  Invalid input(s): PT, 2   Discharge Medications:     Medication List    STOP taking these medications   HYDROcodone-acetaminophen 5-325 MG tablet Commonly known as:  NORCO/VICODIN   ibuprofen 200 MG tablet Commonly known as:  ADVIL,MOTRIN   meloxicam 15 MG tablet Commonly known as:  MOBIC     TAKE these medications   aspirin 325 MG EC tablet Take 1 tablet (325 mg total) by mouth 2 (two) times daily after a meal.   escitalopram 10 MG tablet Commonly known as:  LEXAPRO Take 10 mg by mouth daily.   levothyroxine 112 MCG tablet Commonly known as:  SYNTHROID, LEVOTHROID Take 112 mcg by mouth every evening.   methocarbamol 750 MG tablet Commonly known as:  ROBAXIN Take 1 tablet (750 mg total) by mouth every 6 (six) hours as needed for  muscle spasms.   oxyCODONE-acetaminophen 5-325 MG tablet Commonly known as:  PERCOCET/ROXICET Take 1-2 tablets by mouth every 4 (four) hours as needed for moderate pain or severe pain.   rosuvastatin 20 MG tablet Commonly known as:  CRESTOR Take 20 mg by mouth daily.       Diagnostic Studies: Dg Chest 2 View  Result Date: 10/04/2016 CLINICAL DATA:  Preoperative exam prior to knee replacement. No cardiac complaints or history of abnormality. EXAM: CHEST  2 VIEW COMPARISON:  None in PACs FINDINGS: The lungs are adequately inflated and clear. The heart and pulmonary vascularity are normal. The mediastinum is normal in width. There is calcification in the wall of the aortic arch.  There is mild multilevel degenerative disc disease of the thoracic spine. IMPRESSION: There is no active cardiopulmonary disease. Aortic atherosclerosis. Electronically Signed   By: David  Martinique M.D.   On: 10/04/2016 09:48   Dg Chest Port 1 View  Result Date: 10/14/2016 CLINICAL DATA:  Cough.  Recent total knee replacement EXAM: PORTABLE CHEST 1 VIEW COMPARISON:  October 04, 2016 FINDINGS: There is no edema or consolidation. The heart size and pulmonary vascularity are normal. No adenopathy. There is atherosclerotic calcification in aorta. No bone lesions. IMPRESSION: Aortic atherosclerosis.  No edema or consolidation. Electronically Signed   By: Lowella Grip III M.D.   On: 10/14/2016 13:30    Disposition: 01-Home or Self Care  Discharge Instructions    Call MD / Call 911    Complete by:  As directed    If you experience chest pain or shortness of breath, CALL 911 and be transported to the hospital emergency room.  If you develope a fever above 101 F, pus (white drainage) or increased drainage or redness at the wound, or calf pain, call your surgeon's office.   Constipation Prevention    Complete by:  As directed    Drink plenty of fluids.  Prune juice may be helpful.  You may use a stool softener, such as Colace (over the counter) 100 mg twice a day.  Use MiraLax (over the counter) for constipation as needed.   Diet - low sodium heart healthy    Complete by:  As directed    Discharge instructions    Complete by:  As directed    INSTRUCTIONS AFTER JOINT REPLACEMENT   Remove items at home which could result in a fall. This includes throw rugs or furniture in walking pathways ICE to the affected joint every three hours while awake for 30 minutes at a time, for at least the first 3-5 days, and then as needed for pain and swelling.  Continue to use ice for pain and swelling. You may notice swelling that will progress down to the foot and ankle.  This is normal after surgery.  Elevate your  leg when you are not up walking on it.   Continue to use the breathing machine you got in the hospital (incentive spirometer) which will help keep your temperature down.  It is common for your temperature to cycle up and down following surgery, especially at night when you are not up moving around and exerting yourself.  The breathing machine keeps your lungs expanded and your temperature down.   DIET:  As you were doing prior to hospitalization, we recommend a well-balanced diet.  DRESSING / WOUND CARE / SHOWERING  You may shower 3 days after surgery, but keep the wounds dry during showering.  You may use an occlusive plastic wrap (Press'n  Seal for example), NO SOAKING/SUBMERGING IN THE BATHTUB.  If the bandage gets wet, change with a clean dry gauze.  If the incision gets wet, pat the wound dry with a clean towel.  ACTIVITY  Increase activity slowly as tolerated, but follow the weight bearing instructions below.   No driving for 6 weeks or until further direction given by your physician.  You cannot drive while taking narcotics.  No lifting or carrying greater than 10 lbs. until further directed by your surgeon. Avoid periods of inactivity such as sitting longer than an hour when not asleep. This helps prevent blood clots.  You may return to work once you are authorized by your doctor.     WEIGHT BEARING   Weight bearing as tolerated with assist device (walker, cane, etc) as directed, use it as long as suggested by your surgeon or therapist, typically at least 4-6 weeks.   EXERCISES  Results after joint replacement surgery are often greatly improved when you follow the exercise, range of motion and muscle strengthening exercises prescribed by your doctor. Safety measures are also important to protect the joint from further injury. Any time any of these exercises cause you to have increased pain or swelling, decrease what you are doing until you are comfortable again and then slowly  increase them. If you have problems or questions, call your caregiver or physical therapist for advice.   Rehabilitation is important following a joint replacement. After just a few days of immobilization, the muscles of the leg can become weakened and shrink (atrophy).  These exercises are designed to build up the tone and strength of the thigh and leg muscles and to improve motion. Often times heat used for twenty to thirty minutes before working out will loosen up your tissues and help with improving the range of motion but do not use heat for the first two weeks following surgery (sometimes heat can increase post-operative swelling).   These exercises can be done on a training (exercise) mat, on the floor, on a table or on a bed. Use whatever works the best and is most comfortable for you.    Use music or television while you are exercising so that the exercises are a pleasant break in your day. This will make your life better with the exercises acting as a break in your routine that you can look forward to.   Perform all exercises about fifteen times, three times per day or as directed.  You should exercise both the operative leg and the other leg as well.   Exercises include:   Quad Sets - Tighten up the muscle on the front of the thigh (Quad) and hold for 5-10 seconds.   Straight Leg Raises - With your knee straight (if you were given a brace, keep it on), lift the leg to 60 degrees, hold for 3 seconds, and slowly lower the leg.  Perform this exercise against resistance later as your leg gets stronger.  Leg Slides: Lying on your back, slowly slide your foot toward your buttocks, bending your knee up off the floor (only go as far as is comfortable). Then slowly slide your foot back down until your leg is flat on the floor again.  Angel Wings: Lying on your back spread your legs to the side as far apart as you can without causing discomfort.  Hamstring Strength:  Lying on your back, push your heel  against the floor with your leg straight by tightening up the muscles of your  buttocks.  Repeat, but this time bend your knee to a comfortable angle, and push your heel against the floor.  You may put a pillow under the heel to make it more comfortable if necessary.   A rehabilitation program following joint replacement surgery can speed recovery and prevent re-injury in the future due to weakened muscles. Contact your doctor or a physical therapist for more information on knee rehabilitation.    CONSTIPATION  Constipation is defined medically as fewer than three stools per week and severe constipation as less than one stool per week.  Even if you have a regular bowel pattern at home, your normal regimen is likely to be disrupted due to multiple reasons following surgery.  Combination of anesthesia, postoperative narcotics, change in appetite and fluid intake all can affect your bowels.   YOU MUST use at least one of the following options; they are listed in order of increasing strength to get the job done.  They are all available over the counter, and you may need to use some, POSSIBLY even all of these options:    Drink plenty of fluids (prune juice may be helpful) and high fiber foods Colace 100 mg by mouth twice a day  Senokot for constipation as directed and as needed Dulcolax (bisacodyl), take with full glass of water  Miralax (polyethylene glycol) once or twice a day as needed.  If you have tried all these things and are unable to have a bowel movement in the first 3-4 days after surgery call either your surgeon or your primary doctor.    If you experience loose stools or diarrhea, hold the medications until you stool forms back up.  If your symptoms do not get better within 1 week or if they get worse, check with your doctor.  If you experience "the worst abdominal pain ever" or develop nausea or vomiting, please contact the office immediately for further recommendations for  treatment.   ITCHING:  If you experience itching with your medications, try taking only a single pain pill, or even half a pain pill at a time.  You can also use Benadryl over the counter for itching or also to help with sleep.   TED HOSE STOCKINGS:  Use stockings on both legs until for at least 2 weeks or as directed by physician office. They may be removed at night for sleeping.  MEDICATIONS:  See your medication summary on the "After Visit Summary" that nursing will review with you.  You may have some home medications which will be placed on hold until you complete the course of blood thinner medication.  It is important for you to complete the blood thinner medication as prescribed.  PRECAUTIONS:  If you experience chest pain or shortness of breath - call 911 immediately for transfer to the hospital emergency department.   If you develop a fever greater that 101 F, purulent drainage from wound, increased redness or drainage from wound, foul odor from the wound/dressing, or calf pain - CONTACT YOUR SURGEON.                                                   FOLLOW-UP APPOINTMENTS:  If you do not already have a post-op appointment, please call the office for an appointment to be seen by your surgeon.  Guidelines for how soon to be seen  are listed in your "After Visit Summary", but are typically between 1-4 weeks after surgery.  OTHER INSTRUCTIONS:   Knee Replacement:  Do not place pillow under knee, focus on keeping the knee straight while resting. CPM instructions: 0-90 degrees, 2 hours in the morning, 2 hours in the afternoon, and 2 hours in the evening. Place foam block, curve side up under heel at all times except when in CPM or when walking.  DO NOT modify, tear, cut, or change the foam block in any way.  MAKE SURE YOU:  Understand these instructions.  Get help right away if you are not doing well or get worse.    Thank you for letting us be a part of your medical care team.  It is a  privilege we respect greatly.  We hope these instructions will help you stay on track for a fast and full recovery!   Increase activity slowly as tolerated    Complete by:  As directed       Follow-up Information    DALLDORF,PETER G, MD. Schedule an appointment as soon as possible for a visit in 2 week(s).   Specialty:  Orthopedic Surgery Contact information: [place] [address] [place] .   Why:  Someone from [facility] will contact you to arrange start date and time for therapy. Contact information: [address] [place] [address] [phone]            Signed: Rich Fuchs 10/18/2016, 9:28 AM

## 2016-10-18 NOTE — Progress Notes (Signed)
Physical Therapy Treatment Patient Details Name: Martha Orr MRN: [id number] DOB: [birth year] Today's Date: 10/18/2016    History of Present Illness Pt is a 60 y/o female s/p bilateral TKA's. PMH including but not limited to hypothyroidism, high cholesterol and depression.    PT Comments    Pt progressing well and safe to d/c home.  Informed PT of progress and need for change in recommendations.  Reviewed stair training for threshold negotiation.  HEP issued and reviewed with husband.  OT present post session with patient.    Follow Up Recommendations  Home health PT;Supervision/Assistance - 24 hour     Equipment Recommendations   (Pt has all equipment.  )    Recommendations for Other Services       Precautions / Restrictions Precautions Precautions: Knee;Fall Precaution Comments: PTA discussed positioning of LEs following TKA with pt and pt's spouse. Restrictions Weight Bearing Restrictions: Yes RLE Weight Bearing: Weight bearing as tolerated LLE Weight Bearing: Weight bearing as tolerated    Mobility  Bed Mobility               General bed mobility comments: Pt received in recliner on arrival.    Transfers Overall transfer level: Needs assistance   Transfers: Sit to/from Stand Sit to Stand: Supervision         General transfer comment: Sit to stand Mod I, Pt required VCs for stand to sit to back entirely to seated surface.    Ambulation/Gait Ambulation/Gait assistance: Min guard;Supervision Ambulation Distance (Feet): 450 Feet Assistive device: Rolling walker (2 wheeled) Gait Pattern/deviations: Step-through pattern;Trunk flexed Gait velocity: decreased   General Gait Details: Cues for B heel strike and knee extension in stance phase.  Pt presents with minor instability but able to self correct.  Cues for erect trunk.     Stairs Stairs: Yes Stairs assistance: Min guard Stair Management: No rails;With walker Number of Stairs: 2 General stair  comments: Curb type step x2 trials:  Cues for sequencing and RW placement.  Pt performed additional trial for technique.    Wheelchair Mobility    Modified Rankin (Stroke Patients Only)       Balance     Sitting balance-Leahy Scale: Good       Standing balance-Leahy Scale: Fair                      Cognition Arousal/Alertness: Awake/alert Behavior During Therapy: WFL for tasks assessed/performed Overall Cognitive Status: Within Functional Limits for tasks assessed                      Exercises Total Joint Exercises Goniometric ROM: L knee=75 degrees, R knee=73 degrees.      General Comments        Pertinent Vitals/Pain Pain Assessment: Faces Faces Pain Scale: Hurts even more Pain Location: B knees Pain Descriptors / Indicators: Grimacing;Guarding;Operative site guarding Pain Intervention(s): Monitored during session;Repositioned    Home Living                      Prior Function            PT Goals (current goals can now be found in the care plan section) Acute Rehab PT Goals Patient Stated Goal: to get IV out and go home.   Potential to Achieve Goals: Good Progress towards PT goals: Progressing toward goals    Frequency    7X/week      PT Plan Discharge plan  needs to be updated    Co-evaluation             End of Session Equipment Utilized During Treatment: Gait belt Activity Tolerance: Patient limited by fatigue;Patient limited by pain Patient left: with call bell/phone within reach;with family/visitor present;in chair     Time: 0936-1000 PT Time Calculation (min) (ACUTE ONLY): 24 min  Charges:  $Gait Training: 23-37 mins                    G Codes:      Cristela Blue 10/23/2016, 10:10 AM

## 2016-10-18 NOTE — Evaluation (Signed)
   Occupational Therapy Evaluation and Discharge Patient Details Name: VANNARY SELLNOW MRN: [id number] DOB: May 10, 1956 Today's Date: 10/18/2016    History of Present Illness Pt is a 60 y/o female s/p bilateral TKA's. PMH including but not limited to hypothyroidism, high cholesterol and depression.   Clinical Impression   This 60 yo female admitted and underwent above presents to acute OT with all education completed with pt and her husband without further need for acute OT or HHOT. We will D/C and pt to D/C home today.    Follow Up Recommendations  No OT follow up;Supervision/Assistance - 24 hour    Equipment Recommendations  None recommended by OT       Precautions / Restrictions Precautions Precautions: Knee;Fall Precaution Comments: PTA discussed positioning of LEs following TKA with pt and pt's spouse. Restrictions Weight Bearing Restrictions: No RLE Weight Bearing: Weight bearing as tolerated LLE Weight Bearing: Weight bearing as tolerated      Mobility Bed Mobility               General bed mobility comments: Pt received in recliner on arrival.            ADL                                         General ADL Comments: Educated pt and husband on how to perform shower stall transfer. Also educated them on the most efficient sequence of getting dressed. Educated pt on use of leg lifter (husband says there is something at home they can use for this). Husband reports that he has the reacher, sock aid, and shoe horn at home.               Pertinent Vitals/Pain Pain Assessment: Faces Faces Pain Scale: Hurts even more Pain Location: B knees Pain Descriptors / Indicators: Grimacing;Guarding;Operative site guarding Pain Intervention(s): Monitored during session;Repositioned              Cognition Arousal/Alertness: Awake/alert Behavior During Therapy: WFL for tasks assessed/performed Overall Cognitive Status: Within Functional  Limits for tasks assessed                                                  OT Goals(Current goals can be found in the care plan section) Acute Rehab OT Goals Patient Stated Goal: to get IV out and go home.    OT Frequency: Min 2X/week              End of Session Nurse Communication:  (PT ready to go from OT stand point)  Activity Tolerance: Patient tolerated treatment well (was fatigued from just returning from [facility] with PT) Patient left: in chair;with call bell/phone within reach;with family/visitor present   Time: [id number] OT Time Calculation (min): 20 min Charges:  OT General Charges $OT Visit: 1 Procedure OT Treatments $Self Care/Home Management : 8-22 mins  Almon Register [id number] 10/18/2016, 1:07 PM

## 2016-11-02 ENCOUNTER — Ambulatory Visit (HOSPITAL_COMMUNITY): Payer: 59 | Attending: Family Medicine | Admitting: Physical Therapy

## 2016-11-02 DIAGNOSIS — M25562 Pain in left knee: Secondary | ICD-10-CM | POA: Diagnosis present

## 2016-11-02 DIAGNOSIS — M25662 Stiffness of left knee, not elsewhere classified: Secondary | ICD-10-CM | POA: Insufficient documentation

## 2016-11-02 DIAGNOSIS — R2681 Unsteadiness on feet: Secondary | ICD-10-CM

## 2016-11-02 DIAGNOSIS — M25661 Stiffness of right knee, not elsewhere classified: Secondary | ICD-10-CM | POA: Diagnosis present

## 2016-11-02 DIAGNOSIS — M25561 Pain in right knee: Secondary | ICD-10-CM | POA: Diagnosis not present

## 2016-11-02 NOTE — Patient Instructions (Signed)
Knee Extension (Sitting)    Place __0__ pound weight on left ankle and straighten knee fully, lower slowly. Repeat __10__ times repeat to the left. Do __1__ sets per session. Do ___2_ sessions per day.  http://orth.exer.us/732   Copyright  VHI. All rights reserved.  Strengthening: Quadriceps Set    Tighten muscles on top of thighs by pushing knees down into surface. Hold __5__ seconds. Repeat ___10_ times per set. Do _1___ sets per session. Do __2__ sessions per day.  http://orth.exer.us/602   Copyright  VHI. All rights reserved.  Self-Mobilization: Heel Slide (Supine)    Slide left heel toward buttocks until a gentle stretch is felt. Hold __5__ seconds. Relax. Repeat _10___ times repeat to right . Do _1___ sets per session. Do _2___ sessions per day.  http://orth.exer.us/710   Copyright  VHI. All rights reserved.  Functional Quadriceps: Chair Squat    Keeping feet flat on floor, shoulder width apart, squat as low as is comfortable. Use support as necessary. Repeat _10___ times per set. Do ____1 sets per session. Do ____2 sessions per day.  http://orth.exer.us/736   Copyright  VHI. All rights reserved.  Heel Raise: Bilateral (Standing)    Rise on balls of feet. Repeat ___10_ times per set. Do _1___ sets per session. Do _2___ sessions per day.  http://orth.exer.us/38   Copyright  VHI. All rights reserved.

## 2016-11-02 NOTE — Therapy (Signed)
[facility] [place], [place], [address] Phone: [phone]   Fax:  [phone]  Physical Therapy Evaluation  Patient Details  Name: Martha Orr MRN: [id number] Date of Birth: May 17, 1956 Referring Provider: Melrose Nakayama  Encounter Date: 11/02/2016    Past Medical History:  Diagnosis Date  . Arthritis   . Chronic back pain    buldging disc  . Depression    takes Lexapro daily  . Family history of adverse reaction to anesthesia    sister gets sick after anesthesia  . High cholesterol    takes Crestor daily  . History of colon polyps    benign  . History of shingles   . Hypothyroidism    takes Synthroid daily  . Joint pain   . Joint swelling   . Microscopic hematuria    states her entire life and family is the same way    Past Surgical History:  Procedure Laterality Date  . ABDOMINAL HYSTERECTOMY    . APPENDECTOMY    . BUNIONECTOMY Bilateral   . CARPAL TUNNEL RELEASE Bilateral   . CHOLECYSTECTOMY    . COLONOSCOPY    . KNEE ARTHROSCOPY Right   . knot removed from right breast    . NASAL SINUS SURGERY    . TONSILLECTOMY    . TOTAL KNEE ARTHROPLASTY Bilateral 10/12/2016   Procedure: TOTAL KNEE BILATERAL;  Surgeon: Melrose Nakayama, MD;  Location: [facility];  Service: Orthopedics;  Laterality: Bilateral;    There were no vitals filed for this visit.       Subjective Assessment - 11/02/16 1431    Subjective Martha Orr states that she had both knees replaced on 10/12/2016; she has been receiving home health until 11/01/2016.  She is still having difficulty walking, sleeping, and with  ROM of her knees.    Pertinent History low back pain    How long can you sit comfortably? less than five minutes    How long can you stand comfortably? less than five minutes   How long can you walk comfortably? walks with rolling walker for less than five  minutes    Patient Stated Goals decreased pain    Currently in Pain?  Yes   Pain Score 5    Pain Location Knee   Pain Orientation Right;Medial   Pain Descriptors / Indicators Throbbing   Pain Type Surgical pain   Pain Onset 1 to 4 weeks ago   Pain Frequency Constant   Aggravating Factors  weight bearing    Pain Relieving Factors activity    Effect of Pain on Daily Activities ice,   Multiple Pain Sites Yes   Pain Score 6   Pain Location Knee   Pain Orientation Left;Medial   Pain Descriptors / Indicators Throbbing   Pain Type Surgical pain   Pain Onset 1 to 4 weeks ago   Pain Frequency Constant   Aggravating Factors  weight bearing    Pain Relieving Factors ice    Effect of Pain on Daily Activities increases             [facility] PT Assessment - 11/02/16 0001      Assessment   Medical Diagnosis B TKR   Referring Provider Melrose Nakayama   Onset Date/Surgical Date 10/12/16   Next MD Visit 11/05/2016   Prior Therapy Home health     Precautions   Precautions None     Restrictions   Weight Bearing Restrictions No  Balance Screen   Has the patient fallen in the past 6 months No   Has the patient had a decrease in activity level because of a fear of falling?  Yes   Is the patient reluctant to leave their home because of a fear of falling?  Yes     [place] residence   Type of [place] entrance     Prior Function   Level of [place] Retired   Leisure fish, takes care of chickens      Cognition   Overall Cognitive Status Within Functional Limits for tasks assessed     Observation/Other Assessments   Focus on Therapeutic Outcomes (FOTO)  21     Functional Tests   Functional tests Single leg stance;Sit to Stand     Single Leg Squat   Comments Lt 13; RT 22     ROM / Strength   AROM / PROM / Strength AROM;Strength     AROM   AROM Assessment Site Knee   Right/Left Knee Right;Left   Right Knee Extension 8   Right Knee Flexion 96   Left Knee  Extension 11   Left Knee Flexion 91     Strength   Strength Assessment Site Hip;Knee;Ankle   Right/Left Hip Right;Left   Right Hip Flexion 4+/5   Right Hip Extension 5/5   Right Hip ABduction 5/5   Left Hip Flexion 4-/5   Left Hip Extension 4+/5   Left Hip ABduction 4/5   Right/Left Knee Right;Left   Right Knee Extension 3-/5   Left Knee Flexion 4+/5   Left Knee Extension 3-/5   Right/Left Ankle Right;Left   Right Ankle Dorsiflexion 4-/5   Left Ankle Dorsiflexion 4-/5                   [facility] Adult PT Treatment/Exercise - 11/02/16 0001      Exercises   Exercises Knee/Hip     Knee/Hip Exercises: Seated   Long Arc Quad Both;10 reps     Knee/Hip Exercises: Supine   Quad Sets Both;10 reps   Heel Slides Both;10 reps                  PT Short Term Goals - 11/02/16 1621      PT SHORT TERM GOAL #1   Title PT ROM in both knees to be to 105 to allow pt to be able to sit in comfort for thirty minutes to enjoy a meal    Time 3   Period Weeks   Status New     PT SHORT TERM GOAL #2   Title Pt ROM in Left LE to by below 4 degrees to allow normalized heel toe gait pattern   Time 4   Period Weeks   Status New     PT SHORT TERM GOAL #3   Title Pt to be demonstrating a heel toe gait pattern to elicit the ankle pump mechanism to decrease swelling in LE bilaterally    Time 3   Period Weeks     PT SHORT TERM GOAL #4   Title Pt to be walking inside with a cane with confidence.    Time 3   Period Weeks           PT Long Term Goals - 11/02/16 1623      PT LONG TERM GOAL #1   Title Pt pain level to  be no greater than a 2/10 to allow pt to sleep all night    Time 4   Period Weeks   Status New     PT LONG TERM GOAL #2   Title Pt to be walking with a cane both inside and outside confidently    Time 6   Period Weeks   Status New     PT LONG TERM GOAL #3   Title Pt LE strength to be at least a 4+/5 to allow pt to get up from a low sofa without  difficulty.    Time 6   Period Weeks   Status New     PT LONG TERM GOAL #4   Title PT Single leg stance to be at least 30 bilaterally to allow pt to feel confident walking over uneven terrain to feed the chickens    Time 6   Period Weeks               Plan - 11/02/16 1613    Clinical Impression Statement Martha Orr is a 60 yo female who opted to have B TKR on 10/12/2016.  She was being seen by home health services until 11/01/2016 and is now being referred to out-patient physical thearapy to improve her functional abiity.  Examination demonstrates abnormal gait, difficulty walking, decreased activity tolerance, decreased balance, decreased ROM, decreased strength, increased edema and increased pain.  Martha Orr will benefit from skilled physical therapy to address these issues and maximize her functional ability.    Rehab Potential Good   PT Frequency 3x / week   PT Duration 6 weeks   PT Treatment/Interventions ADLs/Self Care Home Management;Cryotherapy;Associate Professor;Therapeutic activities;Therapeutic exercise;Balance training;Functional mobility training;Manual techniques;Manual lymph drainage;Passive range of motion   PT Next Visit Plan Begin rockerboard, standing knee flexion, standing terminal extension, PROM and manual to decrease edema.  Work on gait attempt ambulation with cane.  Progress focusing on ROM, gait mechanics and balance followed by strength    PT Home Exercise Plan heel raise, squats, LAQ, Q-set, heelslides    Consulted and Agree with Plan of Care Patient      Patient will benefit from skilled therapeutic intervention in order to improve the following deficits and impairments:  Abnormal gait, Decreased activity tolerance, Decreased balance, Decreased range of motion, Decreased strength, Difficulty walking, Increased edema, Decreased scar mobility, Increased fascial restricitons, Pain  Visit Diagnosis: Acute pain of right knee  - Plan: PT plan of care cert/re-cert  Acute pain of left knee - Plan: PT plan of care cert/re-cert  Stiffness of right knee, not elsewhere classified - Plan: PT plan of care cert/re-cert  Stiffness of left knee, not elsewhere classified - Plan: PT plan of care cert/re-cert  Unsteadiness on feet - Plan: PT plan of care cert/re-cert     Problem List Patient Active Problem List   Diagnosis Date Noted  . Bilateral primary osteoarthritis of knee 10/12/2016   Rayetta Humphrey, PT CLT [phone] 11/02/2016, 4:31 PM  [facility] [address] [place], [place], [address] Phone: [phone]   Fax:  [phone]  Name: Martha Orr MRN: [id number] Date of Birth: 11/29/1956

## 2016-11-04 ENCOUNTER — Ambulatory Visit (HOSPITAL_COMMUNITY): Payer: 59 | Admitting: Physical Therapy

## 2016-11-04 DIAGNOSIS — M25562 Pain in left knee: Secondary | ICD-10-CM

## 2016-11-04 DIAGNOSIS — M25662 Stiffness of left knee, not elsewhere classified: Secondary | ICD-10-CM

## 2016-11-04 DIAGNOSIS — R2681 Unsteadiness on feet: Secondary | ICD-10-CM

## 2016-11-04 DIAGNOSIS — M25661 Stiffness of right knee, not elsewhere classified: Secondary | ICD-10-CM

## 2016-11-04 DIAGNOSIS — M25561 Pain in right knee: Secondary | ICD-10-CM

## 2016-11-04 NOTE — Therapy (Signed)
[place], [place], [address] Phone: [phone]   Fax:  [phone]  Physical Therapy Treatment  Patient Details  Name: Martha Orr MRN: [id number] Date of Birth: 1956/08/12 Referring Provider: Melrose Nakayama  Encounter Date: 11/04/2016      PT End of Session - 11/04/16 0910    Visit Number 2   Number of Visits 18   Date for PT Re-Evaluation 12/02/16   PT Start Time 0815   PT Stop Time 0900   PT Time Calculation (min) 45 min   Activity Tolerance Patient tolerated treatment well   Behavior During Therapy [facility] for tasks assessed/performed      Past Medical History:  Diagnosis Date  . Arthritis   . Chronic back pain    buldging disc  . Depression    takes Lexapro daily  . Family history of adverse reaction to anesthesia    sister gets sick after anesthesia  . High cholesterol    takes Crestor daily  . History of colon polyps    benign  . History of shingles   . Hypothyroidism    takes Synthroid daily  . Joint pain   . Joint swelling   . Microscopic hematuria    states her entire life and family is the same way    Past Surgical History:  Procedure Laterality Date  . ABDOMINAL HYSTERECTOMY    . APPENDECTOMY    . BUNIONECTOMY Bilateral   . CARPAL TUNNEL RELEASE Bilateral   . CHOLECYSTECTOMY    . COLONOSCOPY    . KNEE ARTHROSCOPY Right   . knot removed from right breast    . NASAL SINUS SURGERY    . TONSILLECTOMY    . TOTAL KNEE ARTHROPLASTY Bilateral 10/12/2016   Procedure: TOTAL KNEE BILATERAL;  Surgeon: Melrose Nakayama, MD;  Location: [facility];  Service: Orthopedics;  Laterality: Bilateral;    There were no vitals filed for this visit.      Subjective Assessment - 11/04/16 0819    Subjective Patient arrives today stating she is doing OK, she is just sore in both of her knees and her back is bothering her still    Pertinent History low back pain    Patient Stated Goals decreased pain     Currently in Pain? Yes   Pain Score 4    Pain Location Knee   Pain Orientation Right;Left   Pain Descriptors / Indicators Throbbing;Discomfort                         [facility] Adult PT Treatment/Exercise - 11/04/16 0001      Knee/Hip Exercises: Stretches   Active Hamstring Stretch Both;3 reps;30 seconds   Knee: Self-Stretch to increase Flexion Both   Knee: Self-Stretch Limitations 10 reps, 10 second holds    Press photographer Both;3 reps;30 seconds     Knee/Hip Exercises: Supine   Quad Sets Both;10 reps   Quad Sets Limitations 3 second holds    Other Supine Knee/Hip Exercises knee extension overpressure 1x10 B      Manual Therapy   Manual Therapy Edema management;Soft tissue mobilization;Joint mobilization   Manual therapy comments performed separatley from alll other skilled services        Joint Mobilization B patella mobilization, overpressure extension/flexion both knees    Soft tissue mobilization scar mobilization, avoiding scabs                 PT Education -  11/04/16 0910    Education provided Yes   Education Details iniital eval and goals, quad stretch    Person(s) Educated Patient;Spouse   Methods Explanation;Handout   Comprehension Verbalized understanding          PT Short Term Goals - 11/02/16 1621      PT SHORT TERM GOAL #1   Title PT ROM in both knees to be to 105 to allow pt to be able to sit in comfort for thirty minutes to enjoy a meal    Time 3   Period Weeks   Status New     PT SHORT TERM GOAL #2   Title Pt ROM in Left LE to by below 4 degrees to allow normalized heel toe gait pattern   Time 4   Period Weeks   Status New     PT SHORT TERM GOAL #3   Title Pt to be demonstrating a heel toe gait pattern to elicit the ankle pump mechanism to decrease swelling in LE bilaterally    Time 3   Period Weeks     PT SHORT TERM GOAL #4   Title Pt to be walking inside with a cane with confidence.    Time 3   Period Weeks            PT Long Term Goals - 11/02/16 1623      PT LONG TERM GOAL #1   Title Pt pain level to be no greater than a 2/10 to allow pt to sleep all night    Time 4   Period Weeks   Status New     PT LONG TERM GOAL #2   Title Pt to be walking with a cane both inside and outside confidently    Time 6   Period Weeks   Status New     PT LONG TERM GOAL #3   Title Pt LE strength to be at least a 4+/5 to allow pt to get up from a low sofa without difficulty.    Time 6   Period Weeks   Status New     PT LONG TERM GOAL #4   Title PT Single leg stance to be at least 30 bilaterally to allow pt to feel confident walking over uneven terrain to feed the chickens    Time 6   Period Weeks               Plan - 11/04/16 0912    Clinical Impression Statement Patient arrives today with ongoing B knee pain resultant from B knee replacement surgery; focused on functional stretching and ROM based activities today to B knees, including manual techniques, edema control, and overpressure as tolerated today. Incisions appear to be healing well with only a couple of scabbed areas remaining. Reviewed initial eval and goals after completing quick disclosure. Noted significant bilateral quad tightness and added exercise to address this to HEP.    Rehab Potential Good   PT Frequency 3x / week   PT Duration 6 weeks   PT Treatment/Interventions ADLs/Self Care Home Management;Cryotherapy;Associate Professor;Therapeutic activities;Therapeutic exercise;Balance training;Functional mobility training;Manual techniques;Manual lymph drainage;Passive range of motion   PT Next Visit Plan focus on ROM first, as well as gait with cane and balance training; introduce strength when appropriate ROM wise    PT Home Exercise Plan heel raise, squats, LAQ, Q-set, heelslides, quad stretch    Consulted and Agree with Plan of Care Patient      Patient will benefit  from skilled therapeutic  intervention in order to improve the following deficits and impairments:  Abnormal gait, Decreased activity tolerance, Decreased balance, Decreased range of motion, Decreased strength, Difficulty walking, Increased edema, Decreased scar mobility, Increased fascial restricitons, Pain  Visit Diagnosis: Acute pain of right knee  Acute pain of left knee  Stiffness of right knee, not elsewhere classified  Stiffness of left knee, not elsewhere classified  Unsteadiness on feet     Problem List Patient Active Problem List   Diagnosis Date Noted  . Bilateral primary osteoarthritis of knee 10/12/2016    Deniece Ree PT, DPT [facility] [address] [place], [place], [address] Phone: [phone]   Fax:  [phone]  Name: Martha Orr MRN: [id number] Date of Birth: [birth year]

## 2016-11-04 NOTE — Patient Instructions (Signed)
   Quadriceps Stretch, Prone  Position the patient lying on their stomach. Slowly lift the heel and bend the knee until the patient feels a slight stretch in the front of their thigh. Hold this position for 30 seconds, repeat twice each side, twice a day.

## 2016-11-08 ENCOUNTER — Ambulatory Visit (HOSPITAL_COMMUNITY): Payer: 59 | Admitting: Physical Therapy

## 2016-11-08 DIAGNOSIS — M25561 Pain in right knee: Secondary | ICD-10-CM | POA: Diagnosis not present

## 2016-11-08 DIAGNOSIS — M25562 Pain in left knee: Secondary | ICD-10-CM

## 2016-11-08 DIAGNOSIS — M25661 Stiffness of right knee, not elsewhere classified: Secondary | ICD-10-CM

## 2016-11-08 DIAGNOSIS — M25662 Stiffness of left knee, not elsewhere classified: Secondary | ICD-10-CM

## 2016-11-08 DIAGNOSIS — R2681 Unsteadiness on feet: Secondary | ICD-10-CM

## 2016-11-08 NOTE — Therapy (Signed)
[facility], [place], [address] Phone: [phone]   Fax:  [phone]  Physical Therapy Treatment  Patient Details  Name: Martha Orr MRN: [id number] Date of Birth: 1956-08-23 Referring Provider: Melrose Nakayama  Encounter Date: 11/08/2016      PT End of Session - 11/08/16 1205    Visit Number 3   Number of Visits 18   Date for PT Re-Evaluation 12/02/16   Authorization Type [employer]   PT Start Time 1115   PT Stop Time [id number]   PT Time Calculation (min) 49 min   Activity Tolerance Patient tolerated treatment well;No increased pain   Behavior During Therapy [facility] for tasks assessed/performed      Past Medical History:  Diagnosis Date  . Arthritis   . Chronic back pain    buldging disc  . Depression    takes Lexapro daily  . Family history of adverse reaction to anesthesia    sister gets sick after anesthesia  . High cholesterol    takes Crestor daily  . History of colon polyps    benign  . History of shingles   . Hypothyroidism    takes Synthroid daily  . Joint pain   . Joint swelling   . Microscopic hematuria    states her entire life and family is the same way    Past Surgical History:  Procedure Laterality Date  . ABDOMINAL HYSTERECTOMY    . APPENDECTOMY    . BUNIONECTOMY Bilateral   . CARPAL TUNNEL RELEASE Bilateral   . CHOLECYSTECTOMY    . COLONOSCOPY    . KNEE ARTHROSCOPY Right   . knot removed from right breast    . NASAL SINUS SURGERY    . TONSILLECTOMY    . TOTAL KNEE ARTHROPLASTY Bilateral 10/12/2016   Procedure: TOTAL KNEE BILATERAL;  Surgeon: Melrose Nakayama, MD;  Location: [facility];  Service: Orthopedics;  Laterality: Bilateral;    There were no vitals filed for this visit.      Subjective Assessment - 11/08/16 1211    Subjective Pt reports that things are going well. She took pain medication just before she came.    Pertinent History low back pain    Patient Stated Goals  decreased pain    Currently in Pain? No/denies                         [facility] Adult PT Treatment/Exercise - 11/08/16 0001      Ambulation/Gait   Ambulation/Gait Yes   Ambulation Distance (Feet) 60 Feet   Assistive device R Axillary Crutch;L Axillary Crutch   Gait Comments reciprocal 2 point pattern using B loftstrand crutches     Knee/Hip Exercises: Supine   Heel Slides Both;1 set;10 reps   Heel Slides Limitations 10 sec hold      Manual Therapy   Manual Therapy Joint mobilization;Soft tissue mobilization   Manual therapy comments performed separately from all other interventions    Joint Mobilization Lt patella superior/inferior mobilization, grade III/IV   Soft tissue mobilization scar mobilization                PT Education - 11/08/16 1203    Education provided Yes   Education Details discussed implications for manual techniques; discussed parameters for ice/elevation to address swelling; importance of correcting gait pattern to prevent issues down the road    Person(s) Educated Patient;Spouse   Methods Explanation;Demonstration;Verbal cues   Comprehension Verbalized  understanding;Returned demonstration          PT Short Term Goals - 11/02/16 1621      PT SHORT TERM GOAL #1   Title PT ROM in both knees to be to 105 to allow pt to be able to sit in comfort for thirty minutes to enjoy a meal    Time 3   Period Weeks   Status New     PT SHORT TERM GOAL #2   Title Pt ROM in Left LE to by below 4 degrees to allow normalized heel toe gait pattern   Time 4   Period Weeks   Status New     PT SHORT TERM GOAL #3   Title Pt to be demonstrating a heel toe gait pattern to elicit the ankle pump mechanism to decrease swelling in LE bilaterally    Time 3   Period Weeks     PT SHORT TERM GOAL #4   Title Pt to be walking inside with a cane with confidence.    Time 3   Period Weeks           PT Long Term Goals - 11/02/16 1623      PT LONG  TERM GOAL #1   Title Pt pain level to be no greater than a 2/10 to allow pt to sleep all night    Time 4   Period Weeks   Status New     PT LONG TERM GOAL #2   Title Pt to be walking with a cane both inside and outside confidently    Time 6   Period Weeks   Status New     PT LONG TERM GOAL #3   Title Pt LE strength to be at least a 4+/5 to allow pt to get up from a low sofa without difficulty.    Time 6   Period Weeks   Status New     PT LONG TERM GOAL #4   Title PT Single leg stance to be at least 30 bilaterally to allow pt to feel confident walking over uneven terrain to feed the chickens    Time 6   Period Weeks               Plan - 11/08/16 1206    Clinical Impression Statement Today's session continued with therex and manual techniques to address B knee ROM. Pt arriving using loftstrand crutches and I was able to adjust for improved fit and comfort. Pt with improved knee ROM to 100 and 105 degrees in each LE by the end of today's session and reporting no increase in pain.   Rehab Potential Good   PT Frequency 3x / week   PT Duration 6 weeks   PT Treatment/Interventions ADLs/Self Care Home Management;Cryotherapy;Associate Professor;Therapeutic activities;Therapeutic exercise;Balance training;Functional mobility training;Manual techniques;Manual lymph drainage;Passive range of motion;Moist Heat   PT Next Visit Plan focus on ROM first, as well as gait with cane and balance training; introduce strength when appropriate ROM wise; retro rocking   update HEP next visit with options for knee ROM   PT Home Exercise Plan heel raise, squats, LAQ, Q-set, heelslides, quad stretch    Consulted and Agree with Plan of Care Patient      Patient will benefit from skilled therapeutic intervention in order to improve the following deficits and impairments:  Abnormal gait, Decreased activity tolerance, Decreased balance, Decreased range of motion,  Decreased strength, Difficulty walking, Increased edema, Decreased scar mobility, Increased fascial  restricitons, Pain  Visit Diagnosis: Acute pain of right knee  Acute pain of left knee  Stiffness of right knee, not elsewhere classified  Stiffness of left knee, not elsewhere classified  Unsteadiness on feet     Problem List Patient Active Problem List   Diagnosis Date Noted  . Bilateral primary osteoarthritis of knee 10/12/2016    12:12 PM,11/08/16 Elly Modena PT, DPT [facility] [address] [place], [place], [address] Phone: [phone]   Fax:  [phone]  Name: Martha Orr MRN: [id number] Date of Birth: 11/06/1956

## 2016-11-10 ENCOUNTER — Ambulatory Visit (HOSPITAL_COMMUNITY): Payer: 59 | Admitting: Physical Therapy

## 2016-11-10 DIAGNOSIS — M25562 Pain in left knee: Secondary | ICD-10-CM

## 2016-11-10 DIAGNOSIS — M25661 Stiffness of right knee, not elsewhere classified: Secondary | ICD-10-CM

## 2016-11-10 DIAGNOSIS — R2681 Unsteadiness on feet: Secondary | ICD-10-CM

## 2016-11-10 DIAGNOSIS — M25662 Stiffness of left knee, not elsewhere classified: Secondary | ICD-10-CM

## 2016-11-10 DIAGNOSIS — M25561 Pain in right knee: Secondary | ICD-10-CM

## 2016-11-10 NOTE — Therapy (Signed)
[facility] [address] [place], [place], [address] Phone: [phone]   Fax:  [phone]  Physical Therapy Treatment  Patient Details  Name: Martha Orr MRN: [id number] Date of Birth: 1956/03/02 Referring Provider: Melrose Nakayama  Encounter Date: 11/10/2016      PT End of Session - 11/10/16 1214    Visit Number 4   Number of Visits 18   Date for PT Re-Evaluation 12/02/16   Authorization Type UHC   PT Start Time 1118   PT Stop Time 1200   PT Time Calculation (min) 42 min   Activity Tolerance Patient tolerated treatment well   Behavior During Therapy [facility] for tasks assessed/performed      Past Medical History:  Diagnosis Date  . Arthritis   . Chronic back pain    buldging disc  . Depression    takes Lexapro daily  . Family history of adverse reaction to anesthesia    sister gets sick after anesthesia  . High cholesterol    takes Crestor daily  . History of colon polyps    benign  . History of shingles   . Hypothyroidism    takes Synthroid daily  . Joint pain   . Joint swelling   . Microscopic hematuria    states her entire life and family is the same way    Past Surgical History:  Procedure Laterality Date  . ABDOMINAL HYSTERECTOMY    . APPENDECTOMY    . BUNIONECTOMY Bilateral   . CARPAL TUNNEL RELEASE Bilateral   . CHOLECYSTECTOMY    . COLONOSCOPY    . KNEE ARTHROSCOPY Right   . knot removed from right breast    . NASAL SINUS SURGERY    . TONSILLECTOMY    . TOTAL KNEE ARTHROPLASTY Bilateral 10/12/2016   Procedure: TOTAL KNEE BILATERAL;  Surgeon: Melrose Nakayama, MD;  Location: [facility];  Service: Orthopedics;  Laterality: Bilateral;    There were no vitals filed for this visit.      Subjective Assessment - 11/10/16 1128    Subjective Patient arrives in a highly anxious state, reporting that what this DPT did last session was extremely painful and to be honest she really does not want to do this again.  Patient very anxious throughout session and not appearing to internalize DPT education today (see education section for details)   Pertinent History low back pain    Currently in Pain? Yes   Pain Score 3    Pain Location Knee   Pain Orientation Right;Left   Pain Descriptors / Indicators Discomfort   Pain Type Surgical pain   Pain Radiating Towards none    Pain Onset 1 to 4 weeks ago   Pain Frequency Intermittent   Aggravating Factors  being on her feet too long    Pain Relieving Factors medicine    Effect of Pain on Daily Activities restricts PLOF based activities                          [facility] Adult PT Treatment/Exercise - 11/10/16 0001      Ambulation/Gait   Ambulation/Gait Yes   Ambulation Distance (Feet) 188 Feet   Assistive device None   Gait Comments cues for heel-toe gait, min guard      Knee/Hip Exercises: Stretches   Knee: Self-Stretch to increase Flexion Both   Knee: Self-Stretch Limitations 10 reps, 10 second holds    Gastroc Stretch Both;3 reps;30 seconds  Knee/Hip Exercises: Supine   Quad Sets Both;1 set;15 reps   Quad Sets Limitations 3 second holds    Heel Slides Both;1 set;15 reps   Heel Slides Limitations 10 second holds      Manual Therapy   Manual Therapy Soft tissue mobilization;Joint mobilization   Manual therapy comments performed separately from all other interventions    Joint Mobilization patella mobilizations all directions B    Soft tissue mobilization scar mobilization                 PT Education - 11/10/16 1213    Education provided Yes   Education Details extensive education regarding importance of aggressive ROM and achieving satisfactory ROM in knees as early as possible, related discomfort to ROM intervetions is normal; ability of PTs to adjust interventions as each patient does not tolerate them equally well    Person(s) Educated Patient;Spouse   Methods Explanation   Comprehension Verbalized understanding           PT Short Term Goals - 11/02/16 1621      PT SHORT TERM GOAL #1   Title PT ROM in both knees to be to 105 to allow pt to be able to sit in comfort for thirty minutes to enjoy a meal    Time 3   Period Weeks   Status New     PT SHORT TERM GOAL #2   Title Pt ROM in Left LE to by below 4 degrees to allow normalized heel toe gait pattern   Time 4   Period Weeks   Status New     PT SHORT TERM GOAL #3   Title Pt to be demonstrating a heel toe gait pattern to elicit the ankle pump mechanism to decrease swelling in LE bilaterally    Time 3   Period Weeks     PT SHORT TERM GOAL #4   Title Pt to be walking inside with a cane with confidence.    Time 3   Period Weeks           PT Long Term Goals - 11/02/16 1623      PT LONG TERM GOAL #1   Title Pt pain level to be no greater than a 2/10 to allow pt to sleep all night    Time 4   Period Weeks   Status New     PT LONG TERM GOAL #2   Title Pt to be walking with a cane both inside and outside confidently    Time 6   Period Weeks   Status New     PT LONG TERM GOAL #3   Title Pt LE strength to be at least a 4+/5 to allow pt to get up from a low sofa without difficulty.    Time 6   Period Weeks   Status New     PT LONG TERM GOAL #4   Title PT Single leg stance to be at least 30 bilaterally to allow pt to feel confident walking over uneven terrain to feed the chickens    Time 6   Period Weeks               Plan - 11/10/16 1214    Clinical Impression Statement Patient arrives today in a high anxiety state, reporting that this DPT "hurt me quite a bit with the ROM exercises last time and I will walk out if it hurts again"; attempted to educate regarding importance of aggressive PT for knee  ROM and typical pain response with TKRs/ROM exercises as well as great importance of achieving full ROM as soon as possible, also available adjustments that can be made to treatment sessions for individual patients however  patient remains quite anxious and did not appear to internalize education today. Focus on functional ROM and stretches today, chiefly replicating interventions performed last treatment as patient states she prefers these. Finished session with gait with no device, min guard and focus on heel toe pattern.    Rehab Potential Good   PT Frequency 3x / week   PT Duration 6 weeks   PT Treatment/Interventions ADLs/Self Care Home Management;Cryotherapy;Associate Professor;Therapeutic activities;Therapeutic exercise;Balance training;Functional mobility training;Manual techniques;Manual lymph drainage;Passive range of motion;Moist Heat   PT Next Visit Plan focus on ROM first, as well as gait with cane and balance training; introduce strength when appropriate ROM wise; retro rocking    PT Home Exercise Plan heel raise, squats, LAQ, Q-set, heelslides, quad stretch    Consulted and Agree with Plan of Care Patient      Patient will benefit from skilled therapeutic intervention in order to improve the following deficits and impairments:  Abnormal gait, Decreased activity tolerance, Decreased balance, Decreased range of motion, Decreased strength, Difficulty walking, Increased edema, Decreased scar mobility, Increased fascial restricitons, Pain  Visit Diagnosis: Acute pain of right knee  Acute pain of left knee  Stiffness of right knee, not elsewhere classified  Stiffness of left knee, not elsewhere classified  Unsteadiness on feet     Problem List Patient Active Problem List   Diagnosis Date Noted  . Bilateral primary osteoarthritis of knee 10/12/2016    Deniece Ree PT, DPT [facility] [address] [place], [place], [address] Phone: [phone]   Fax:  [phone]  Name: Martha Orr MRN: [id number] Date of Birth: 09-01-1956

## 2016-11-12 ENCOUNTER — Ambulatory Visit (HOSPITAL_COMMUNITY): Payer: 59 | Admitting: Physical Therapy

## 2016-11-12 DIAGNOSIS — M25662 Stiffness of left knee, not elsewhere classified: Secondary | ICD-10-CM

## 2016-11-12 DIAGNOSIS — M25561 Pain in right knee: Secondary | ICD-10-CM

## 2016-11-12 DIAGNOSIS — R2681 Unsteadiness on feet: Secondary | ICD-10-CM

## 2016-11-12 DIAGNOSIS — M25562 Pain in left knee: Secondary | ICD-10-CM

## 2016-11-12 DIAGNOSIS — M25661 Stiffness of right knee, not elsewhere classified: Secondary | ICD-10-CM

## 2016-11-12 NOTE — Therapy (Signed)
[facility] [address] [place], [place], [address] Phone: [phone]   Fax:  [phone]  Physical Therapy Treatment  Patient Details  Name: Martha Orr MRN: [id number] Date of Birth: 07-07-1956 Referring Provider: Melrose Nakayama  Encounter Date: 11/12/2016      PT End of Session - 11/12/16 1242    Visit Number 5   Number of Visits 18   Date for PT Re-Evaluation 12/02/16   Authorization Type UHC   PT Start Time [id number]   PT Stop Time 1116   PT Time Calculation (min) 45 min   Activity Tolerance Patient tolerated treatment well;No increased pain   Behavior During Therapy [facility] for tasks assessed/performed      Past Medical History:  Diagnosis Date  . Arthritis   . Chronic back pain    buldging disc  . Depression    takes Lexapro daily  . Family history of adverse reaction to anesthesia    sister gets sick after anesthesia  . High cholesterol    takes Crestor daily  . History of colon polyps    benign  . History of shingles   . Hypothyroidism    takes Synthroid daily  . Joint pain   . Joint swelling   . Microscopic hematuria    states her entire life and family is the same way    Past Surgical History:  Procedure Laterality Date  . ABDOMINAL HYSTERECTOMY    . APPENDECTOMY    . BUNIONECTOMY Bilateral   . CARPAL TUNNEL RELEASE Bilateral   . CHOLECYSTECTOMY    . COLONOSCOPY    . KNEE ARTHROSCOPY Right   . knot removed from right breast    . NASAL SINUS SURGERY    . TONSILLECTOMY    . TOTAL KNEE ARTHROPLASTY Bilateral 10/12/2016   Procedure: TOTAL KNEE BILATERAL;  Surgeon: Melrose Nakayama, MD;  Location: [facility];  Service: Orthopedics;  Laterality: Bilateral;    There were no vitals filed for this visit.      Subjective Assessment - 11/12/16 1035    Subjective Pt arrives without any concerns at this time. Notes that she has low back pain possibly from mopping her floor yesterday. She has been working on her  exercises.    Pertinent History low back pain    Currently in Pain? No/denies   Pain Onset 1 to 4 weeks ago                         [facility] Adult PT Treatment/Exercise - 11/12/16 0001      Knee/Hip Exercises: Stretches   Other Knee/Hip Stretches glute max stretch in supine, Lt only 3x20 sec      Knee/Hip Exercises: Supine   Heel Slides Both;1 set;10 reps;Other (comment)   Heel Slides Limitations 5 sec hold    Bridges Limitations 2x10 reps      Manual Therapy   Manual Therapy Joint mobilization;Soft tissue mobilization;Passive ROM;Muscle Energy Technique   Manual therapy comments performed separately from all other interventions    Joint Mobilization Lt patella mobilization S/I grade III-IV, B tibiofemoral posterior/medial rotation, Grade III/IV   Soft tissue mobilization scar mobilization    Passive ROM Lt knee ext hold 10x10 sec    Muscle Energy Technique B contract relax                 PT Education - 11/12/16 1240    Education provided Yes   Education Details discussed  the importance of manual techniques in addition to therex to improve knee ROM. Reviewed techniques with pt and her husband for better adherence at home; discussed progress with knee ROM and encouraged pt to incorporate more stretching/ROM at home throughout her day    Person(s) Educated Patient;Spouse   Methods Explanation;Demonstration   Comprehension Verbalized understanding;Returned demonstration          PT Short Term Goals - 11/02/16 1621      PT SHORT TERM GOAL #1   Title PT ROM in both knees to be to 105 to allow pt to be able to sit in comfort for thirty minutes to enjoy a meal    Time 3   Period Weeks   Status New     PT SHORT TERM GOAL #2   Title Pt ROM in Left LE to by below 4 degrees to allow normalized heel toe gait pattern   Time 4   Period Weeks   Status New     PT SHORT TERM GOAL #3   Title Pt to be demonstrating a heel toe gait pattern to elicit the ankle  pump mechanism to decrease swelling in LE bilaterally    Time 3   Period Weeks     PT SHORT TERM GOAL #4   Title Pt to be walking inside with a cane with confidence.    Time 3   Period Weeks           PT Long Term Goals - 11/02/16 1623      PT LONG TERM GOAL #1   Title Pt pain level to be no greater than a 2/10 to allow pt to sleep all night    Time 4   Period Weeks   Status New     PT LONG TERM GOAL #2   Title Pt to be walking with a cane both inside and outside confidently    Time 6   Period Weeks   Status New     PT LONG TERM GOAL #3   Title Pt LE strength to be at least a 4+/5 to allow pt to get up from a low sofa without difficulty.    Time 6   Period Weeks   Status New     PT LONG TERM GOAL #4   Title PT Single leg stance to be at least 30 bilaterally to allow pt to feel confident walking over uneven terrain to feed the chickens    Time 6   Period Weeks               Plan - 11/12/16 1242    Clinical Impression Statement Pt arrived with some stiffness in both knees today, along with Lt upper buttock pain onset yesterday after cleaning the floors. Session continued with manual techniques and therex to improve knee ROM, increasing flexion to 100 and 110 deg on the Lt and Rt. Pt's lower back pain appears to be coming from her glutes and was addressed with introduction of a glute stretch which she performed at the end of her session. I also encouraged pt to continue using AD until we can ensure her technique is appropriate without. Both her and her husband verbalized understanding. She noted no increase in pain following the session.   Rehab Potential Good   PT Frequency 3x / week   PT Duration 6 weeks   PT Treatment/Interventions ADLs/Self Care Home Management;Cryotherapy;Associate Professor;Therapeutic activities;Therapeutic exercise;Balance training;Functional mobility training;Manual techniques;Manual lymph drainage;Passive  range of motion;Moist  Heat   PT Next Visit Plan Knee ROM, introduce weights to RLE strengthening and with LLE once ROM is appropriate, gait training without AD or with SPC   PT Home Exercise Plan heel raise, squats, LAQ, Q-set, heelslides, quad stretch    Consulted and Agree with Plan of Care Patient      Patient will benefit from skilled therapeutic intervention in order to improve the following deficits and impairments:  Abnormal gait, Decreased activity tolerance, Decreased balance, Decreased range of motion, Decreased strength, Difficulty walking, Increased edema, Decreased scar mobility, Increased fascial restricitons, Pain  Visit Diagnosis: Acute pain of right knee  Acute pain of left knee  Stiffness of right knee, not elsewhere classified  Unsteadiness on feet  Stiffness of left knee, not elsewhere classified     Problem List Patient Active Problem List   Diagnosis Date Noted  . Bilateral primary osteoarthritis of knee 10/12/2016   12:53 PM,11/12/16 Elly Modena PT, DPT [facility] [address] [place], [place], [address] Phone: [phone]   Fax:  [phone]  Name: Martha Orr MRN: [id number] Date of Birth: [birth year]

## 2016-11-15 ENCOUNTER — Ambulatory Visit (HOSPITAL_COMMUNITY): Payer: 59 | Admitting: Physical Therapy

## 2016-11-15 DIAGNOSIS — M25662 Stiffness of left knee, not elsewhere classified: Secondary | ICD-10-CM

## 2016-11-15 DIAGNOSIS — M25561 Pain in right knee: Secondary | ICD-10-CM

## 2016-11-15 DIAGNOSIS — M25562 Pain in left knee: Secondary | ICD-10-CM

## 2016-11-15 DIAGNOSIS — R2681 Unsteadiness on feet: Secondary | ICD-10-CM

## 2016-11-15 DIAGNOSIS — M25661 Stiffness of right knee, not elsewhere classified: Secondary | ICD-10-CM

## 2016-11-15 NOTE — Therapy (Signed)
[facility] [address] [place], [place], [address] Phone: [phone]   Fax:  [phone]  Physical Therapy Treatment  Patient Details  Name: Martha Orr MRN: [id number] Date of Birth: [birth year] Referring Provider: Melrose Nakayama  Encounter Date: 11/15/2016      PT End of Session - 11/15/16 1429    Visit Number 6   Number of Visits 18   Date for PT Re-Evaluation 12/02/16   Authorization Type UHC   Authorization - Number of Visits 20   PT Start Time [id number]   PT Stop Time [id number]   PT Time Calculation (min) 48 min   Activity Tolerance Patient tolerated treatment well;No increased pain   Behavior During Therapy W[facility] for tasks assessed/performed      Past Medical History:  Diagnosis Date  . Arthritis   . Chronic back pain    buldging disc  . Depression    takes Lexapro daily  . Family history of adverse reaction to anesthesia    sister gets sick after anesthesia  . High cholesterol    takes Crestor daily  . History of colon polyps    benign  . History of shingles   . Hypothyroidism    takes Synthroid daily  . Joint pain   . Joint swelling   . Microscopic hematuria    states her entire life and family is the same way    Past Surgical History:  Procedure Laterality Date  . ABDOMINAL HYSTERECTOMY    . APPENDECTOMY    . BUNIONECTOMY Bilateral   . CARPAL TUNNEL RELEASE Bilateral   . CHOLECYSTECTOMY    . COLONOSCOPY    . KNEE ARTHROSCOPY Right   . knot removed from right breast    . NASAL SINUS SURGERY    . TONSILLECTOMY    . TOTAL KNEE ARTHROPLASTY Bilateral 10/12/2016   Procedure: TOTAL KNEE BILATERAL;  Surgeon: Melrose Nakayama, MD;  Location: [facility];  Service: Orthopedics;  Laterality: Bilateral;    There were no vitals filed for this visit.      Subjective Assessment - 11/15/16 1120    Subjective Pt arrives stating she has noticed improvements in swelling and everything following her last session. She has  been working on her walking at home and other exercises.    Pertinent History low back pain    Currently in Pain? No/denies   Pain Onset 1 to 4 weeks ago            [facility] PT Assessment - 11/15/16 0001      AROM   Right Knee Extension 0   Right Knee Flexion 105   Left Knee Flexion 95                     [facility] Adult PT Treatment/Exercise - 11/15/16 0001      Ambulation/Gait   Gait Comments proper sequencing noted with SPC, x225 ft. No signs of unsteadiness/LOB noted      Knee/Hip Exercises: Stretches   Knee: Self-Stretch to increase Flexion Both;5 reps;10 seconds   Other Knee/Hip Stretches seated knee flexion stretch 10x10 sec each      Knee/Hip Exercises: Seated   Sit to Sand 10 reps;without UE support     Manual Therapy   Manual Therapy Soft tissue mobilization;Joint mobilization   Manual therapy comments performed separately from all other interventions    Joint Mobilization Lt knee closed chain mobilization of femur on tibia, 5x30 sec grade IV  Soft tissue mobilization scar mobilization BLE                PT Education - 11/15/16 1256    Education provided Yes   Education Details discussed importance of incorporating ROM exercises throughout the day to improve tolerance; updated HEP; technique with therex; encouraged pt to use SPC at home to improve independence with ambulation    Person(s) Educated Patient;Spouse   Methods Explanation;Demonstration;Handout   Comprehension Verbalized understanding;Returned demonstration          PT Short Term Goals - 11/02/16 1621      PT SHORT TERM GOAL #1   Title PT ROM in both knees to be to 105 to allow pt to be able to sit in comfort for thirty minutes to enjoy a meal    Time 3   Period Weeks   Status New     PT SHORT TERM GOAL #2   Title Pt ROM in Left LE to by below 4 degrees to allow normalized heel toe gait pattern   Time 4   Period Weeks   Status New     PT SHORT TERM GOAL #3   Title Pt  to be demonstrating a heel toe gait pattern to elicit the ankle pump mechanism to decrease swelling in LE bilaterally    Time 3   Period Weeks     PT SHORT TERM GOAL #4   Title Pt to be walking inside with a cane with confidence.    Time 3   Period Weeks           PT Long Term Goals - 11/02/16 1623      PT LONG TERM GOAL #1   Title Pt pain level to be no greater than a 2/10 to allow pt to sleep all night    Time 4   Period Weeks   Status New     PT LONG TERM GOAL #2   Title Pt to be walking with a cane both inside and outside confidently    Time 6   Period Weeks   Status New     PT LONG TERM GOAL #3   Title Pt LE strength to be at least a 4+/5 to allow pt to get up from a low sofa without difficulty.    Time 6   Period Weeks   Status New     PT LONG TERM GOAL #4   Title PT Single leg stance to be at least 30 bilaterally to allow pt to feel confident walking over uneven terrain to feed the chickens    Time 6   Period Weeks               Plan - 11/15/16 1430    Clinical Impression Statement Pt continues to make steady progress with both knee ROM and functional mobility. Some time spent on manual techniques to improve scar mobility as well as gait training with SPC to increase independence with ambulation. She was able to use the [facility] without noted unsteadiness and with proper gait mechanics for 269ft. Updated HEP with several options of therex to improve knee ROM with pt able to demonstrate proper technique of all exercises. Will continue with current POC.   Rehab Potential Good   PT Frequency 3x / week   PT Duration 6 weeks   PT Treatment/Interventions ADLs/Self Care Home Management;Cryotherapy;Associate Professor;Therapeutic activities;Therapeutic exercise;Balance training;Functional mobility training;Manual techniques;Manual lymph drainage;Passive range of motion;Moist Heat   PT Next Visit  Plan Knee ROM, introduce weights to RLE  strengthening and with LLE once ROM is appropriate, gait training without AD or with SPC   PT Home Exercise Plan seated knee flexion stretch, knee drive, knee extension stretch    Consulted and Agree with Plan of Care Patient      Patient will benefit from skilled therapeutic intervention in order to improve the following deficits and impairments:  Abnormal gait, Decreased activity tolerance, Decreased balance, Decreased range of motion, Decreased strength, Difficulty walking, Increased edema, Decreased scar mobility, Increased fascial restricitons, Pain  Visit Diagnosis: Acute pain of right knee  Acute pain of left knee  Stiffness of right knee, not elsewhere classified  Unsteadiness on feet  Stiffness of left knee, not elsewhere classified     Problem List Patient Active Problem List   Diagnosis Date Noted  . Bilateral primary osteoarthritis of knee 10/12/2016    3:12 PM,11/15/16 Elly Modena PT, DPT [facility] [address] [place], [place], [address] Phone: [phone]   Fax:  [phone]  Name: Martha Orr MRN: [id number] Date of Birth: February 22, 1956

## 2016-11-17 ENCOUNTER — Ambulatory Visit (HOSPITAL_COMMUNITY): Payer: 59 | Admitting: Physical Therapy

## 2016-11-17 DIAGNOSIS — R2681 Unsteadiness on feet: Secondary | ICD-10-CM

## 2016-11-17 DIAGNOSIS — M25562 Pain in left knee: Secondary | ICD-10-CM

## 2016-11-17 DIAGNOSIS — M25561 Pain in right knee: Secondary | ICD-10-CM | POA: Diagnosis not present

## 2016-11-17 DIAGNOSIS — M25661 Stiffness of right knee, not elsewhere classified: Secondary | ICD-10-CM

## 2016-11-17 DIAGNOSIS — M25662 Stiffness of left knee, not elsewhere classified: Secondary | ICD-10-CM

## 2016-11-17 NOTE — Therapy (Signed)
[place], [place], [address] Phone: [phone]   Fax:  [phone]  Physical Therapy Treatment  Patient Details  Name: Martha Orr MRN: [id number] Date of Birth: [birth year] Referring Provider: Melrose Nakayama  Encounter Date: 11/17/2016      PT End of Session - 11/17/16 0941    Visit Number 7   Number of Visits 18   Date for PT Re-Evaluation 12/02/16   Authorization Type UHC   Authorization - Visit Number 7   Authorization - Number of Visits 20   PT Start Time 0904   PT Stop Time 0945   PT Time Calculation (min) 41 min   Activity Tolerance Patient tolerated treatment well;No increased pain   Behavior During Therapy WFL for tasks assessed/performed      Past Medical History:  Diagnosis Date  . Arthritis   . Chronic back pain    buldging disc  . Depression    takes Lexapro daily  . Family history of adverse reaction to anesthesia    sister gets sick after anesthesia  . High cholesterol    takes Crestor daily  . History of colon polyps    benign  . History of shingles   . Hypothyroidism    takes Synthroid daily  . Joint pain   . Joint swelling   . Microscopic hematuria    states her entire life and family is the same way    Past Surgical History:  Procedure Laterality Date  . ABDOMINAL HYSTERECTOMY    . APPENDECTOMY    . BUNIONECTOMY Bilateral   . CARPAL TUNNEL RELEASE Bilateral   . CHOLECYSTECTOMY    . COLONOSCOPY    . KNEE ARTHROSCOPY Right   . knot removed from right breast    . NASAL SINUS SURGERY    . TONSILLECTOMY    . TOTAL KNEE ARTHROPLASTY Bilateral 10/12/2016   Procedure: TOTAL KNEE BILATERAL;  Surgeon: Melrose Nakayama, MD;  Location: [facility];  Service: Orthopedics;  Laterality: Bilateral;    There were no vitals filed for this visit.      Subjective Assessment - 11/17/16 0900    Pertinent History low back pain    Currently in Pain? Yes   Pain Score 3    Pain  Location Knee   Pain Orientation Left   Pain Descriptors / Indicators Aching   Pain Type Surgical pain   Pain Onset 1 to 4 weeks ago                         [facility] Adult PT Treatment/Exercise - 11/17/16 0001      Knee/Hip Exercises: Stretches   Active Hamstring Stretch Both;3 reps;30 seconds   Knee: Self-Stretch to increase Flexion Both;3 reps;30 seconds   Gastroc Stretch Both;3 reps;30 seconds   Gastroc Stretch Limitations slant board      Knee/Hip Exercises: Standing   Heel Raises Both;10 reps   Knee Flexion Both;5 reps   Terminal Knee Extension Limitations x 10 B    Functional Squat 10 reps   Rocker Board 2 minutes   SLS 3 x each; Rt x 10seconds; LT : 230     Knee/Hip Exercises: Supine   Quad Sets AROM;Both;10 reps   Knee Extension PROM;Left;5 reps   Knee Extension Limitations Lt: 9;  Rt 0   Knee Flexion Limitations LT: 105; Rt: 110  PT Education - 11/17/16 0941    Education provided Yes   Education Details self mob for improved extension   Person(s) Educated Patient   Methods Explanation;Handout;Demonstration   Comprehension Verbalized understanding          PT Short Term Goals - 11/02/16 1621      PT SHORT TERM GOAL #1   Title PT ROM in both knees to be to 105 to allow pt to be able to sit in comfort for thirty minutes to enjoy a meal    Time 3   Period Weeks   Status New     PT SHORT TERM GOAL #2   Title Pt ROM in Left LE to by below 4 degrees to allow normalized heel toe gait pattern   Time 4   Period Weeks   Status New     PT SHORT TERM GOAL #3   Title Pt to be demonstrating a heel toe gait pattern to elicit the ankle pump mechanism to decrease swelling in LE bilaterally    Time 3   Period Weeks     PT SHORT TERM GOAL #4   Title Pt to be walking inside with a cane with confidence.    Time 3   Period Weeks           PT Long Term Goals - 11/02/16 1623      PT LONG TERM GOAL #1   Title Pt pain level  to be no greater than a 2/10 to allow pt to sleep all night    Time 4   Period Weeks   Status New     PT LONG TERM GOAL #2   Title Pt to be walking with a cane both inside and outside confidently    Time 6   Period Weeks   Status New     PT LONG TERM GOAL #3   Title Pt LE strength to be at least a 4+/5 to allow pt to get up from a low sofa without difficulty.    Time 6   Period Weeks   Status New     PT LONG TERM GOAL #4   Title PT Single leg stance to be at least 30 bilaterally to allow pt to feel confident walking over uneven terrain to feed the chickens    Time 6   Period Weeks               Plan - 11/17/16 0942    Clinical Impression Statement Martha Orr continues to improve functionally and with ROM.  Added functional strengthening exercises to pt program with good tolerance.    Rehab Potential Good   PT Frequency 3x / week   PT Duration 6 weeks   PT Treatment/Interventions ADLs/Self Care Home Management;Cryotherapy;Associate Professor;Therapeutic activities;Therapeutic exercise;Balance training;Functional mobility training;Manual techniques;Manual lymph drainage;Passive range of motion;Moist Heat   PT Next Visit Plan Knee ROM, introduce weights to RLE strengthening and with LLE once ROM is appropriate, gait training without AD or with SPC   PT Home Exercise Plan seated knee flexion stretch, knee drive, knee extension stretch    Consulted and Agree with Plan of Care Patient      Patient will benefit from skilled therapeutic intervention in order to improve the following deficits and impairments:  Abnormal gait, Decreased activity tolerance, Decreased balance, Decreased range of motion, Decreased strength, Difficulty walking, Increased edema, Decreased scar mobility, Increased fascial restricitons, Pain  Visit Diagnosis: Acute pain of right knee  Acute  pain of left knee  Stiffness of right knee, not elsewhere  classified  Unsteadiness on feet  Stiffness of left knee, not elsewhere classified     Problem List Patient Active Problem List   Diagnosis Date Noted  . Bilateral primary osteoarthritis of knee 10/12/2016   Rayetta Humphrey, PT CLT [phone] 11/17/2016, 9:48 AM  [facility] [address] [place], [place], [address] Phone: [phone]   Fax:  [phone]  Name: Martha Orr MRN: [id number] Date of Birth: 07-30-1956

## 2016-11-17 NOTE — Patient Instructions (Addendum)
Knee Extension Mobilization: Hang (Prone)    With table supporting thighs, place _2___ pound weight on left  ankle. Hold 5-30____ minutes. Repeat _1___ times per set. Do _1___ sets per session. Do 1-2____ sessions per day.  http://orth.exer.us/722   Copyright  VHI. All rights reserved.  Knee Extension Mobilization: Towel Prop    With rolled towel under left  ankle, place __5__ pound weight across knee. Hold _5-30___ minutes. Repeat __1__ times per set. Do ___1_ sets per session. Do __1-2__ sessions per day.  http://orth.exer.us/720   Copyright  VHI. All rights reserved.

## 2016-11-22 ENCOUNTER — Ambulatory Visit (HOSPITAL_COMMUNITY): Payer: 59 | Admitting: Physical Therapy

## 2016-11-22 DIAGNOSIS — R2681 Unsteadiness on feet: Secondary | ICD-10-CM

## 2016-11-22 DIAGNOSIS — M25561 Pain in right knee: Secondary | ICD-10-CM

## 2016-11-22 DIAGNOSIS — M25662 Stiffness of left knee, not elsewhere classified: Secondary | ICD-10-CM

## 2016-11-22 DIAGNOSIS — M25661 Stiffness of right knee, not elsewhere classified: Secondary | ICD-10-CM

## 2016-11-22 DIAGNOSIS — M25562 Pain in left knee: Secondary | ICD-10-CM

## 2016-11-22 NOTE — Therapy (Signed)
[place], [place], [address] Phone: [phone]   Fax:  [phone]  Physical Therapy Treatment  Patient Details  Name: Martha Orr MRN: [id number] Date of Birth: [birth year] Referring Provider: Melrose Nakayama  Encounter Date: 11/22/2016      PT End of Session - 11/22/16 1210    Visit Number 8   Number of Visits 18   Date for PT Re-Evaluation 12/02/16   Authorization Type UHC   Authorization - Visit Number 8   Authorization - Number of Visits 20   PT Start Time [id number]   PT Stop Time 1201   PT Time Calculation (min) 45 min   Activity Tolerance Patient tolerated treatment well;No increased pain   Behavior During Therapy WFL for tasks assessed/performed      Past Medical History:  Diagnosis Date  . Arthritis   . Chronic back pain    buldging disc  . Depression    takes Lexapro daily  . Family history of adverse reaction to anesthesia    sister gets sick after anesthesia  . High cholesterol    takes Crestor daily  . History of colon polyps    benign  . History of shingles   . Hypothyroidism    takes Synthroid daily  . Joint pain   . Joint swelling   . Microscopic hematuria    states her entire life and family is the same way    Past Surgical History:  Procedure Laterality Date  . ABDOMINAL HYSTERECTOMY    . APPENDECTOMY    . BUNIONECTOMY Bilateral   . CARPAL TUNNEL RELEASE Bilateral   . CHOLECYSTECTOMY    . COLONOSCOPY    . KNEE ARTHROSCOPY Right   . knot removed from right breast    . NASAL SINUS SURGERY    . TONSILLECTOMY    . TOTAL KNEE ARTHROPLASTY Bilateral 10/12/2016   Procedure: TOTAL KNEE BILATERAL;  Surgeon: Melrose Nakayama, MD;  Location: [facility];  Service: Orthopedics;  Laterality: Bilateral;    There were no vitals filed for this visit.      Subjective Assessment - 11/22/16 1118    Subjective Pt reports that last night she had alot of Lt medial knee pain. She only has a  little bit of soreness today.    Pertinent History low back pain    Currently in Pain? Yes   Pain Score 3    Pain Location Knee   Pain Orientation Left;Medial   Pain Descriptors / Indicators Aching;Sore   Pain Type Surgical pain   Pain Onset More than a month ago   Pain Frequency Intermittent   Aggravating Factors  just came on out of nowhere    Pain Relieving Factors not sure                          [facility] Adult PT Treatment/Exercise - 11/22/16 0001      Ambulation/Gait   Pre-Gait Activities Ascend/Descend steps with step to pattern and B handrails x1 RT, Verbal/visual cues provided to perform reciprocal pattern with 1 handrail x3 RT.     Knee/Hip Exercises: Stretches   Active Hamstring Stretch Both;2 reps;30 seconds   Active Hamstring Stretch Limitations 12" box      Knee/Hip Exercises: Standing   Step Down Right;1 set;15 reps;Hand Hold: 2;Step Height: 4"   Step Down Limitations lateral step down    Other Standing Knee Exercises Standing on Rt with hip  hike, x15 reps, 4" box    Other Standing Knee Exercises SLS: x3 trials each, up to 20 sec max each      Knee/Hip Exercises: Seated   Knee/Hip Flexion knee flexion stretch 10x10 sec each    Sit to Sand 2 sets;10 reps;without UE support     Knee/Hip Exercises: Supine   Quad Sets Left;1 set;15 reps   Quad Sets Limitations 3 sec hold    Short Arc Quad Sets Both;1 set;15 reps;AROM                PT Education - 11/22/16 1209    Education provided Yes   Education Details discussed decreasing PT frequency to get optimal benefit as she is becoming more independent with exercises at home; importance of proper technique with stairs    Person(s) Educated Patient   Methods Explanation;Demonstration;Verbal cues   Comprehension Verbalized understanding;Returned demonstration          PT Short Term Goals - 11/02/16 1621      PT SHORT TERM GOAL #1   Title PT ROM in both knees to be to 105 to allow pt to  be able to sit in comfort for thirty minutes to enjoy a meal    Time 3   Period Weeks   Status New     PT SHORT TERM GOAL #2   Title Pt ROM in Left LE to by below 4 degrees to allow normalized heel toe gait pattern   Time 4   Period Weeks   Status New     PT SHORT TERM GOAL #3   Title Pt to be demonstrating a heel toe gait pattern to elicit the ankle pump mechanism to decrease swelling in LE bilaterally    Time 3   Period Weeks     PT SHORT TERM GOAL #4   Title Pt to be walking inside with a cane with confidence.    Time 3   Period Weeks           PT Long Term Goals - 11/02/16 1623      PT LONG TERM GOAL #1   Title Pt pain level to be no greater than a 2/10 to allow pt to sleep all night    Time 4   Period Weeks   Status New     PT LONG TERM GOAL #2   Title Pt to be walking with a cane both inside and outside confidently    Time 6   Period Weeks   Status New     PT LONG TERM GOAL #3   Title Pt LE strength to be at least a 4+/5 to allow pt to get up from a low sofa without difficulty.    Time 6   Period Weeks   Status New     PT LONG TERM GOAL #4   Title PT Single leg stance to be at least 30 bilaterally to allow pt to feel confident walking over uneven terrain to feed the chickens    Time 6   Period Weeks               Plan - 11/22/16 1211    Clinical Impression Statement Continued this session with therex to continue addressing knee ROM, noting improvements to Rt: 110 deg, and Lt: 105 deg by the end of today's session. Assessed pt's technique with functional tasks such as sit to stand and stair negotiation, noting improved weight shift with sit to stand, but positive Trendelenburg  while descending stairs secondary to poor LE strength and hip stability. Performed functional exercises of step downs and hip hikes to address this with minimal improvements. Will continue to address this in future sessions.   Rehab Potential Good   PT Frequency 3x / week    PT Duration 6 weeks   PT Treatment/Interventions ADLs/Self Care Home Management;Cryotherapy;Associate Professor;Therapeutic activities;Therapeutic exercise;Balance training;Functional mobility training;Manual techniques;Manual lymph drainage;Passive range of motion;Moist Heat   PT Next Visit Plan Knee ROM, introduce weights to RLE strengthening and with LLE once ROM is appropriate, gait training without AD    PT Home Exercise Plan seated knee flexion stretch, knee drive, knee extension stretch, sit to stand    Consulted and Agree with Plan of Care Patient      Patient will benefit from skilled therapeutic intervention in order to improve the following deficits and impairments:  Abnormal gait, Decreased activity tolerance, Decreased balance, Decreased range of motion, Decreased strength, Difficulty walking, Increased edema, Decreased scar mobility, Increased fascial restricitons, Pain  Visit Diagnosis: Acute pain of left knee  Acute pain of right knee  Stiffness of right knee, not elsewhere classified  Unsteadiness on feet  Stiffness of left knee, not elsewhere classified     Problem List Patient Active Problem List   Diagnosis Date Noted  . Bilateral primary osteoarthritis of knee 10/12/2016   12:25 PM,11/22/16 Elly Modena PT, DPT [facility] [address] [place], [place], [address] Phone: [phone]   Fax:  [phone]  Name: Martha Orr MRN: [id number] Date of Birth: 1956/03/12

## 2016-11-24 ENCOUNTER — Ambulatory Visit (HOSPITAL_COMMUNITY): Payer: 59

## 2016-11-25 LAB — HM DEXA SCAN: HM Dexa Scan: NORMAL

## 2016-11-26 ENCOUNTER — Ambulatory Visit (HOSPITAL_COMMUNITY): Payer: 59 | Attending: Family Medicine | Admitting: Physical Therapy

## 2016-11-26 DIAGNOSIS — M25562 Pain in left knee: Secondary | ICD-10-CM | POA: Diagnosis present

## 2016-11-26 DIAGNOSIS — R2681 Unsteadiness on feet: Secondary | ICD-10-CM | POA: Insufficient documentation

## 2016-11-26 DIAGNOSIS — M25661 Stiffness of right knee, not elsewhere classified: Secondary | ICD-10-CM | POA: Insufficient documentation

## 2016-11-26 DIAGNOSIS — M25662 Stiffness of left knee, not elsewhere classified: Secondary | ICD-10-CM

## 2016-11-26 DIAGNOSIS — M25561 Pain in right knee: Secondary | ICD-10-CM | POA: Insufficient documentation

## 2016-11-26 NOTE — Therapy (Signed)
[place], [place], [address] Phone: [phone]   Fax:  [phone]  Physical Therapy Treatment  Patient Details  Name: Martha Orr MRN: [id number] Date of Birth: March 04, 1956 Referring Provider: Melrose Nakayama  Encounter Date: 11/26/2016      PT End of Session - 11/26/16 1125    Visit Number 9   Number of Visits 12   Date for PT Re-Evaluation 12/02/16   Authorization Type UHC   Authorization - Visit Number 9   Authorization - Number of Visits 12   PT Start Time [id number]   PT Stop Time [id number]   PT Time Calculation (min) 46 min   Activity Tolerance Patient tolerated treatment well;No increased pain   Behavior During Therapy WFL for tasks assessed/performed      Past Medical History:  Diagnosis Date  . Arthritis   . Chronic back pain    buldging disc  . Depression    takes Lexapro daily  . Family history of adverse reaction to anesthesia    sister gets sick after anesthesia  . High cholesterol    takes Crestor daily  . History of colon polyps    benign  . History of shingles   . Hypothyroidism    takes Synthroid daily  . Joint pain   . Joint swelling   . Microscopic hematuria    states her entire life and family is the same way    Past Surgical History:  Procedure Laterality Date  . ABDOMINAL HYSTERECTOMY    . APPENDECTOMY    . BUNIONECTOMY Bilateral   . CARPAL TUNNEL RELEASE Bilateral   . CHOLECYSTECTOMY    . COLONOSCOPY    . KNEE ARTHROSCOPY Right   . knot removed from right breast    . NASAL SINUS SURGERY    . TONSILLECTOMY    . TOTAL KNEE ARTHROPLASTY Bilateral 10/12/2016   Procedure: TOTAL KNEE BILATERAL;  Surgeon: Melrose Nakayama, MD;  Location: [facility];  Service: Orthopedics;  Laterality: Bilateral;    There were no vitals filed for this visit.      Subjective Assessment - 11/26/16 1034    Subjective Pt reports that things are going well. She only has minor achiness in her Lt  knee.    Pertinent History low back pain    Currently in Pain? No/denies   Pain Onset --            [facility] PT Assessment - 11/26/16 0001      AROM   Right Knee Flexion 110  115 post    Left Knee Extension 5  lacking   Left Knee Flexion 106  110 post                     [facility] Adult PT Treatment/Exercise - 11/26/16 0001      Knee/Hip Exercises: Stretches   Active Hamstring Stretch Both;3 reps;30 seconds   Active Hamstring Stretch Limitations 12" box    Knee: Self-Stretch to increase Flexion Both   Knee: Self-Stretch Limitations 10x10 sec hold    Gastroc Stretch Both;3 reps;30 seconds   Gastroc Stretch Limitations slant board      Manual Therapy   Manual therapy comments performed separately from all other interventions    Joint Mobilization Lt proximal fibular mobs, grade III/IV; Grade IV Medial posterior mobs femur on tibia; closed chain Lt medial tibial mobs during knee flexion stretch 5x10 sec   Soft tissue mobilization rolling Lt distal  quad/ITB   Passive ROM knee extension 5x20 sec                 PT Education - 11/26/16 1124    Education provided Yes   Education Details discussed noted progress with knee ROM and expected continuation with HEP adherence in months to come; encouraged pt to ice/elevate her knee once she gets home due to possible increase in swelling/pain following extensive manual therapy performed during today's visit.    Person(s) Educated Patient   Methods Explanation;Demonstration   Comprehension Verbalized understanding;Returned demonstration          PT Short Term Goals - 11/02/16 1621      PT SHORT TERM GOAL #1   Title PT ROM in both knees to be to 105 to allow pt to be able to sit in comfort for thirty minutes to enjoy a meal    Time 3   Period Weeks   Status New     PT SHORT TERM GOAL #2   Title Pt ROM in Left LE to by below 4 degrees to allow normalized heel toe gait pattern   Time 4   Period Weeks   Status  New     PT SHORT TERM GOAL #3   Title Pt to be demonstrating a heel toe gait pattern to elicit the ankle pump mechanism to decrease swelling in LE bilaterally    Time 3   Period Weeks     PT SHORT TERM GOAL #4   Title Pt to be walking inside with a cane with confidence.    Time 3   Period Weeks           PT Long Term Goals - 11/02/16 1623      PT LONG TERM GOAL #1   Title Pt pain level to be no greater than a 2/10 to allow pt to sleep all night    Time 4   Period Weeks   Status New     PT LONG TERM GOAL #2   Title Pt to be walking with a cane both inside and outside confidently    Time 6   Period Weeks   Status New     PT LONG TERM GOAL #3   Title Pt LE strength to be at least a 4+/5 to allow pt to get up from a low sofa without difficulty.    Time 6   Period Weeks   Status New     PT LONG TERM GOAL #4   Title PT Single leg stance to be at least 30 bilaterally to allow pt to feel confident walking over uneven terrain to feed the chickens    Time 6   Period Weeks               Plan - 11/26/16 1126    Clinical Impression Statement Pt continues to make progress towards her goals of improved mobility and ROM. She arrived to the clinic without her AD and demonstrated good technique. She continues to lack a few degrees of Lt terminal knee extension, however her knee flexion ROM is improving each session. Manual techniques were performed with a little over 5 degree improvement in B knee flexion by the end. Pt reporting no increase in pain, however she was encouraged to ice/elevate due to possible side effects of manual techniques performed. She verbalized understanding.   Rehab Potential Good   PT Frequency 3x / week   PT Duration 6 weeks   PT  Treatment/Interventions ADLs/Self Care Home Management;Cryotherapy;Associate Professor;Therapeutic activities;Therapeutic exercise;Balance training;Functional mobility training;Manual  techniques;Manual lymph drainage;Passive range of motion;Moist Heat   PT Next Visit Plan Knee ROM, this is pt's primary concern. Can add functinoal sit to stand, etc. if necessary   PT Home Exercise Plan seated knee flexion stretch, knee drive, knee extension stretch, sit to stand    Consulted and Agree with Plan of Care Patient      Patient will benefit from skilled therapeutic intervention in order to improve the following deficits and impairments:  Abnormal gait, Decreased activity tolerance, Decreased balance, Decreased range of motion, Decreased strength, Difficulty walking, Increased edema, Decreased scar mobility, Increased fascial restricitons, Pain  Visit Diagnosis: Acute pain of left knee  Acute pain of right knee  Unsteadiness on feet  Stiffness of right knee, not elsewhere classified  Stiffness of left knee, not elsewhere classified     Problem List Patient Active Problem List   Diagnosis Date Noted  . Bilateral primary osteoarthritis of knee 10/12/2016    11:35 AM,11/26/16 Elly Modena PT, DPT [facility] [address] [place], [place], [address] Phone: [phone]   Fax:  [phone]  Name: Martha Orr MRN: [id number] Date of Birth: November 23, 1956

## 2016-11-29 ENCOUNTER — Encounter (HOSPITAL_COMMUNITY): Payer: 59 | Admitting: Physical Therapy

## 2016-12-01 ENCOUNTER — Encounter (HOSPITAL_COMMUNITY): Payer: 59 | Admitting: Physical Therapy

## 2016-12-03 ENCOUNTER — Ambulatory Visit (HOSPITAL_COMMUNITY): Payer: 59 | Admitting: Physical Therapy

## 2016-12-03 DIAGNOSIS — M25661 Stiffness of right knee, not elsewhere classified: Secondary | ICD-10-CM

## 2016-12-03 DIAGNOSIS — M25562 Pain in left knee: Secondary | ICD-10-CM | POA: Diagnosis not present

## 2016-12-03 DIAGNOSIS — M25561 Pain in right knee: Secondary | ICD-10-CM

## 2016-12-03 DIAGNOSIS — R2681 Unsteadiness on feet: Secondary | ICD-10-CM

## 2016-12-03 DIAGNOSIS — M25662 Stiffness of left knee, not elsewhere classified: Secondary | ICD-10-CM

## 2016-12-03 NOTE — Therapy (Signed)
Martha Orr, [place], [address] Phone: [phone]   Fax:  [phone]  Physical Therapy Treatment  Patient Details  Name: Martha Orr MRN: [id number] Date of Birth: [birth year] Referring Provider: Melrose Nakayama  Encounter Date: 12/03/2016      PT End of Session - 12/03/16 1049    Visit Number 10   Number of Visits 12   Date for PT Re-Evaluation 12/02/16   Authorization Type U[employer]   Authorization - Visit Number 10   Authorization - Number of Visits 12   PT Start Time [phone]   PT Stop Time 1031   PT Time Calculation (min) 42 min   Activity Tolerance Patient tolerated treatment well;No increased pain   Behavior During Therapy WFL for tasks assessed/performed      Past Medical History:  Diagnosis Date  . Arthritis   . Chronic back pain    buldging disc  . Depression    takes Lexapro daily  . Family history of adverse reaction to anesthesia    sister gets sick after anesthesia  . High cholesterol    takes Crestor daily  . History of colon polyps    benign  . History of shingles   . Hypothyroidism    takes Synthroid daily  . Joint pain   . Joint swelling   . Microscopic hematuria    states her entire life and family is the same way    Past Surgical History:  Procedure Laterality Date  . ABDOMINAL HYSTERECTOMY    . APPENDECTOMY    . BUNIONECTOMY Bilateral   . CARPAL TUNNEL RELEASE Bilateral   . CHOLECYSTECTOMY    . COLONOSCOPY    . KNEE ARTHROSCOPY Right   . knot removed from right breast    . NASAL SINUS SURGERY    . TONSILLECTOMY    . TOTAL KNEE ARTHROPLASTY Bilateral 10/12/2016   Procedure: TOTAL KNEE BILATERAL;  Surgeon: Melrose Nakayama, MD;  Location: [facility];  Service: Orthopedics;  Laterality: Bilateral;    There were no vitals filed for this visit.      Subjective Assessment - 12/03/16 0952    Subjective Pt reports that things are going good today. She is not sore today. She has  been doing her HEP.   Pertinent History low back pain    Currently in Pain? No/denies            [facility] PT Assessment - 12/03/16 0001      AROM   Right Knee Flexion 110   Left Knee Flexion 105  110 post session                     [facility] Adult PT Treatment/Exercise - 12/03/16 0001      Knee/Hip Exercises: Stretches   Active Hamstring Stretch 30 seconds;2 reps   Active Hamstring Stretch Limitations 12" box    Knee: Self-Stretch to increase Flexion Both   Knee: Self-Stretch Limitations 10x10 sec each  seated and standing on 12" step    Gastroc Stretch Both;3 reps;30 seconds   Gastroc Stretch Limitations slant board      Manual Therapy   Manual Therapy Passive ROM   Joint Mobilization posterior medial rotation mobilization with movement during knee flexion stretch on step x5 reps each   Passive ROM knee extension stretch (Lt) 10x10 sec                 PT Education - 12/03/16 1046  Education provided Yes   Education Details Discussed importance of focusing on technique with exercises provided at last session to improve hip strength, also explained how this will help her back. Encouraged continued HEP adherence.   Person(s) Educated Patient;Spouse   Methods Explanation;Demonstration   Comprehension Verbalized understanding;Returned demonstration          PT Short Term Goals - 11/02/16 1621      PT SHORT TERM GOAL #1   Title PT ROM in both knees to be to 105 to allow pt to be able to sit in comfort for thirty minutes to enjoy a meal    Time 3   Period Weeks   Status New     PT SHORT TERM GOAL #2   Title Pt ROM in Left LE to by below 4 degrees to allow normalized heel toe gait pattern   Time 4   Period Weeks   Status New     PT SHORT TERM GOAL #3   Title Pt to be demonstrating a heel toe gait pattern to elicit the ankle pump mechanism to decrease swelling in LE bilaterally    Time 3   Period Weeks     PT SHORT TERM GOAL #4   Title Pt to  be walking inside with a cane with confidence.    Time 3   Period Weeks           PT Long Term Goals - 11/02/16 1623      PT LONG TERM GOAL #1   Title Pt pain level to be no greater than a 2/10 to allow pt to sleep all night    Time 4   Period Weeks   Status New     PT LONG TERM GOAL #2   Title Pt to be walking with a cane both inside and outside confidently    Time 6   Period Weeks   Status New     PT LONG TERM GOAL #3   Title Pt LE strength to be at least a 4+/5 to allow pt to get up from a low sofa without difficulty.    Time 6   Period Weeks   Status New     PT LONG TERM GOAL #4   Title PT Single leg stance to be at least 30 bilaterally to allow pt to feel confident walking over uneven terrain to feed the chickens    Time 6   Period Weeks               Plan - 12/03/16 1050    Clinical Impression Statement Pt continues to make progress towards goals with steady improvements in knee ROM. She continues to walk without her AD and reports minimal knee pain. Session continued with major focus on knee ROM per pt request, however I ended the session discussed the importance of HEP adherence with proper technique to ensure she is also making gains in functional strength.    Rehab Potential Good   PT Frequency 3x / week   PT Duration 6 weeks   PT Treatment/Interventions ADLs/Self Care Home Management;Cryotherapy;Associate Professor;Therapeutic activities;Therapeutic exercise;Balance training;Functional mobility training;Manual techniques;Manual lymph drainage;Passive range of motion;Moist Heat   PT Next Visit Plan Knee ROM, this is pt's primary concern. Can add functinoal sit to stand, etc. if necessary   PT Home Exercise Plan seated knee flexion stretch, knee drive, knee extension stretch, sit to stand    Consulted and Agree with Plan of Care Patient  Patient will benefit from skilled therapeutic intervention in order to improve  the following deficits and impairments:  Abnormal gait, Decreased activity tolerance, Decreased balance, Decreased range of motion, Decreased strength, Difficulty walking, Increased edema, Decreased scar mobility, Increased fascial restricitons, Pain  Visit Diagnosis: Acute pain of left knee  Acute pain of right knee  Unsteadiness on feet  Stiffness of right knee, not elsewhere classified  Stiffness of left knee, not elsewhere classified     Problem List Patient Active Problem List   Diagnosis Date Noted  . Bilateral primary osteoarthritis of knee 10/12/2016   11:13 AM,12/03/16 Elly Modena PT, DPT [facility] [place] [address] [place], [place], [address] Phone: [phone]   Fax:  [phone]  Name: Martha Orr MRN: [id number] Date of Birth: [birth year]

## 2016-12-06 ENCOUNTER — Encounter (HOSPITAL_COMMUNITY): Payer: 59 | Admitting: Physical Therapy

## 2016-12-08 ENCOUNTER — Encounter (HOSPITAL_COMMUNITY): Payer: 59

## 2016-12-10 ENCOUNTER — Ambulatory Visit (HOSPITAL_COMMUNITY): Payer: 59

## 2016-12-10 DIAGNOSIS — M25661 Stiffness of right knee, not elsewhere classified: Secondary | ICD-10-CM

## 2016-12-10 DIAGNOSIS — R2681 Unsteadiness on feet: Secondary | ICD-10-CM

## 2016-12-10 DIAGNOSIS — M25562 Pain in left knee: Secondary | ICD-10-CM

## 2016-12-10 DIAGNOSIS — M25662 Stiffness of left knee, not elsewhere classified: Secondary | ICD-10-CM

## 2016-12-10 DIAGNOSIS — M25561 Pain in right knee: Secondary | ICD-10-CM

## 2016-12-10 NOTE — Therapy (Signed)
[place], [place], [address] Phone: [phone]   Fax:  [phone]  Physical Therapy Treatment  Patient Details  Name: Martha Orr MRN: [id number] Date of Birth: [birth year] Referring Provider: Melrose Nakayama  Encounter Date: 12/10/2016      PT End of Session - 12/10/16 1041    Visit Number 11   Number of Visits 12   Date for PT Re-Evaluation 12/02/16   Authorization Type UHC   Authorization - Visit Number 11   Authorization - Number of Visits 12   PT Start Time [id number]   PT Stop Time 1122   PT Time Calculation (min) 47 min   Activity Tolerance Patient tolerated treatment well;No increased pain   Behavior During Therapy WFL for tasks assessed/performed      Past Medical History:  Diagnosis Date  . Arthritis   . Chronic back pain    buldging disc  . Depression    takes Lexapro daily  . Family history of adverse reaction to anesthesia    sister gets sick after anesthesia  . High cholesterol    takes Crestor daily  . History of colon polyps    benign  . History of shingles   . Hypothyroidism    takes Synthroid daily  . Joint pain   . Joint swelling   . Microscopic hematuria    states her entire life and family is the same way    Past Surgical History:  Procedure Laterality Date  . ABDOMINAL HYSTERECTOMY    . APPENDECTOMY    . BUNIONECTOMY Bilateral   . CARPAL TUNNEL RELEASE Bilateral   . CHOLECYSTECTOMY    . COLONOSCOPY    . KNEE ARTHROSCOPY Right   . knot removed from right breast    . NASAL SINUS SURGERY    . TONSILLECTOMY    . TOTAL KNEE ARTHROPLASTY Bilateral 10/12/2016   Procedure: TOTAL KNEE BILATERAL;  Surgeon: Melrose Nakayama, MD;  Location: [facility];  Service: Orthopedics;  Laterality: Bilateral;    There were no vitals filed for this visit.      Subjective Assessment - 12/10/16 1035    Subjective Pt stated her main concern with knee extension, current pain scale 2-3/10  mainly stiffness today.   Pertinent History low back pain    Patient Stated Goals decreased pain    Currently in Pain? Yes   Pain Score 3    Pain Location Knee   Pain Orientation Left   Pain Descriptors / Indicators Tightness   Pain Type Surgical pain   Pain Radiating Towards none   Pain Onset More than a month ago   Pain Frequency Intermittent   Aggravating Factors  just came on out of nowhere   Pain Relieving Factors not sure   Effect of Pain on Daily Activities restricts PLOF based activities            [facility] PT Assessment - 12/10/16 0001      Assessment   Medical Diagnosis B TKR   Referring Provider Melrose Nakayama   Onset Date/Surgical Date 10/12/16   Next MD Visit 01/05/2017   Prior Therapy Home health                     [facility] Adult PT Treatment/Exercise - 12/10/16 0001      Knee/Hip Exercises: Stretches   Active Hamstring Stretch 3 reps;30 seconds   Active Hamstring Stretch Limitations 12" box    Knee: Self-Stretch to  increase Flexion Both   Knee: Self-Stretch Limitations 10x10 sec each   Gastroc Stretch Both;3 reps;30 seconds   Gastroc Stretch Limitations slant board      Knee/Hip Exercises: Seated   Sit to Sand 10 reps;without UE support  low mat height     Knee/Hip Exercises: Supine   Short Arc Quad Sets 15 reps;Both   Heel Slides Both;1 set;10 reps;Other (comment)   Heel Slides Limitations 5 sec hold Lt 5-110 (112 AA); Rt 0-112 AROM     Manual Therapy   Manual Therapy Passive ROM;Edema management;Joint mobilization   Manual therapy comments performed separately from all other interventions    Edema Management Retro massage with B LEs elevated    Joint Mobilization patella mobs all directions; tib/fib anterior mobs   Passive ROM knee extension stretch (Lt) 10x10 sec                   PT Short Term Goals - 11/02/16 1621      PT SHORT TERM GOAL #1   Title PT ROM in both knees to be to 105 to allow pt to be able to sit in  comfort for thirty minutes to enjoy a meal    Time 3   Period Weeks   Status New     PT SHORT TERM GOAL #2   Title Pt ROM in Left LE to by below 4 degrees to allow normalized heel toe gait pattern   Time 4   Period Weeks   Status New     PT SHORT TERM GOAL #3   Title Pt to be demonstrating a heel toe gait pattern to elicit the ankle pump mechanism to decrease swelling in LE bilaterally    Time 3   Period Weeks     PT SHORT TERM GOAL #4   Title Pt to be walking inside with a cane with confidence.    Time 3   Period Weeks           PT Long Term Goals - 11/02/16 1623      PT LONG TERM GOAL #1   Title Pt pain level to be no greater than a 2/10 to allow pt to sleep all night    Time 4   Period Weeks   Status New     PT LONG TERM GOAL #2   Title Pt to be walking with a cane both inside and outside confidently    Time 6   Period Weeks   Status New     PT LONG TERM GOAL #3   Title Pt LE strength to be at least a 4+/5 to allow pt to get up from a low sofa without difficulty.    Time 6   Period Weeks   Status New     PT LONG TERM GOAL #4   Title PT Single leg stance to be at least 30 bilaterally to allow pt to feel confident walking over uneven terrain to feed the chickens    Time 6   Period Weeks               Plan - 12/10/16 1243    Clinical Impression Statement Continued session foucs on improving Bil knee mobility to improve ROM per pt. request.  Reviewed compliance with HEP adherence.  Manual soft tissue retro massage for edema control, stretches and functional strengthening complete to improve ROM.  Husband sat through session and was educated on benefits and technqiues with retro massage for edema  control to assist with ROM and pain control.  Pt continues to make gains towards ROM goals with AROM Lt 5-112 degrees and Rt at 0-112 degrees.  Encouraged to apply ice for pain and edema control following therex.     Rehab Potential Good   PT Frequency 3x / week    PT Duration 6 weeks   PT Treatment/Interventions ADLs/Self Care Home Management;Cryotherapy;Associate Professor;Therapeutic activities;Therapeutic exercise;Balance training;Functional mobility training;Manual techniques;Manual lymph drainage;Passive range of motion;Moist Heat   PT Next Visit Plan Knee ROM, this is pt's primary concern. Can add functinoal sit to stand, etc. if necessary      Patient will benefit from skilled therapeutic intervention in order to improve the following deficits and impairments:  Abnormal gait, Decreased activity tolerance, Decreased balance, Decreased range of motion, Decreased strength, Difficulty walking, Increased edema, Decreased scar mobility, Increased fascial restricitons, Pain  Visit Diagnosis: Acute pain of left knee  Acute pain of right knee  Unsteadiness on feet  Stiffness of right knee, not elsewhere classified  Stiffness of left knee, not elsewhere classified     Problem List Patient Active Problem List   Diagnosis Date Noted  . Bilateral primary osteoarthritis of knee 10/12/2016   Ihor Austin, LPTA; CBIS [phone]  Aldona Lento 12/10/2016, 12:50 PM  [facility] [address] [place], [place], [address] Phone: [phone]   Fax:  [phone]  Name: Martha Orr MRN: [id number] Date of Birth: 1956-05-12

## 2016-12-13 ENCOUNTER — Ambulatory Visit (HOSPITAL_COMMUNITY): Payer: 59 | Admitting: Physical Therapy

## 2016-12-13 DIAGNOSIS — R2681 Unsteadiness on feet: Secondary | ICD-10-CM

## 2016-12-13 DIAGNOSIS — M25661 Stiffness of right knee, not elsewhere classified: Secondary | ICD-10-CM

## 2016-12-13 DIAGNOSIS — M25562 Pain in left knee: Secondary | ICD-10-CM | POA: Diagnosis not present

## 2016-12-13 DIAGNOSIS — M25662 Stiffness of left knee, not elsewhere classified: Secondary | ICD-10-CM

## 2016-12-13 DIAGNOSIS — M25561 Pain in right knee: Secondary | ICD-10-CM

## 2016-12-13 NOTE — Therapy (Signed)
[place], [place], [address] Phone: [phone]   Fax:  [phone]  Physical Therapy Treatment/Discharge  Patient Details  Name: Martha Orr MRN: [id number] Date of Birth: 1956/04/28 Referring Provider: Melrose Nakayama  Encounter Date: 12/13/2016      PT End of Session - 12/13/16 1526    Visit Number 12   Number of Visits 12   Date for PT Re-Evaluation 12/02/16   Authorization Type UHC   Authorization - Visit Number 12   Authorization - Number of Visits 12   PT Start Time 6283   PT Stop Time 1110   PT Time Calculation (min) 40 min   Activity Tolerance Patient tolerated treatment well;No increased pain   Behavior During Therapy WFL for tasks assessed/performed      Past Medical History:  Diagnosis Date  . Arthritis   . Chronic back pain    buldging disc  . Depression    takes Lexapro daily  . Family history of adverse reaction to anesthesia    sister gets sick after anesthesia  . High cholesterol    takes Crestor daily  . History of colon polyps    benign  . History of shingles   . Hypothyroidism    takes Synthroid daily  . Joint pain   . Joint swelling   . Microscopic hematuria    states her entire life and family is the same way    Past Surgical History:  Procedure Laterality Date  . ABDOMINAL HYSTERECTOMY    . APPENDECTOMY    . BUNIONECTOMY Bilateral   . CARPAL TUNNEL RELEASE Bilateral   . CHOLECYSTECTOMY    . COLONOSCOPY    . KNEE ARTHROSCOPY Right   . knot removed from right breast    . NASAL SINUS SURGERY    . TONSILLECTOMY    . TOTAL KNEE ARTHROPLASTY Bilateral 10/12/2016   Procedure: TOTAL KNEE BILATERAL;  Surgeon: Melrose Nakayama, MD;  Location: [facility];  Service: Orthopedics;  Laterality: Bilateral;    There were no vitals filed for this visit.      Subjective Assessment - 12/13/16 1033    Subjective Pt reports things are going good. She continues to work on her HEP  without any difficulty.    Pertinent History low back pain    How long can you sit comfortably? unlimited    How long can you stand comfortably? unlimited, only minor stiffness    How long can you walk comfortably? unlimited, only minor stiffness   Patient Stated Goals decreased pain    Currently in Pain? No/denies   Pain Onset More than a month ago            [facility] PT Assessment - 12/13/16 0001      Assessment   Medical Diagnosis B TKR   Referring Provider Melrose Nakayama   Onset Date/Surgical Date 10/12/16   Next MD Visit 11/05/2016   Prior Therapy Home health     Precautions   Precautions None     Restrictions   Weight Bearing Restrictions No     Balance Screen   Has the patient fallen in the past 6 months No   Has the patient had a decrease in activity level because of a fear of falling?  No   Is the patient reluctant to leave their home because of a fear of falling?  No     Home Environment   Living Environment Private residence   Type  of [place] entrance     Prior Function   Level of Vivian Retired   Leisure fish, takes care of chickens      Cognition   Overall Cognitive Status Within Functional Limits for tasks assessed     Observation/Other Assessments   Focus on Therapeutic Outcomes (FOTO)  10% limited      Functional Tests   Functional tests Single leg stance;Sit to Stand     Single Leg Squat   Comments Rt: 12 sec ; Lt: 20+sec     AROM   Right Knee Extension 3   Right Knee Flexion 115   Left Knee Extension 6   Left Knee Flexion 113     Strength   Right Hip Flexion 5/5   Right Hip Extension 5/5   Right Hip ABduction 5/5   Left Hip Flexion 4+/5   Left Hip Extension 4+/5   Left Hip ABduction 5/5   Right Knee Extension 5/5   Left Knee Flexion 5/5   Left Knee Extension 5/5   Right Ankle Dorsiflexion 5/5   Left Ankle Dorsiflexion 5/5                             PT  Education - 12/13/16 1524    Education provided Yes   Education Details discussed goals/progress; encouraged continued HEP adherence to progress knee ROM; encouraged pt moving her seat while on the bike to carry over into her upright biking    Person(s) Educated Patient;Spouse   Methods Explanation;Demonstration   Comprehension Returned demonstration;Verbalized understanding          PT Short Term Goals - 12/13/16 1100      PT SHORT TERM GOAL #1   Title PT ROM in both knees to be to 105 to allow pt to be able to sit in comfort for thirty minutes to enjoy a meal    Time 3   Period Weeks   Status Achieved     PT SHORT TERM GOAL #2   Title Pt ROM in Left LE to by below 4 degrees to allow normalized heel toe gait pattern   Time 4   Period Weeks   Status Achieved     PT SHORT TERM GOAL #3   Title Pt to be demonstrating a heel toe gait pattern to elicit the ankle pump mechanism to decrease swelling in LE bilaterally    Time 3   Period Weeks   Status Achieved     PT SHORT TERM GOAL #4   Title Pt to be walking inside with a cane with confidence.    Time 3   Period Weeks   Status Achieved           PT Long Term Goals - 12/13/16 1101      PT LONG TERM GOAL #1   Title Pt pain level to be no greater than a 2/10 to allow pt to sleep all night    Baseline higher pain levels after sitting for a while. otherwise no issues with this   Time 4   Period Weeks   Status Partially Met     PT LONG TERM GOAL #2   Title Pt to be walking with a cane both inside and outside confidently    Time 6   Period Weeks   Status Achieved     PT LONG TERM GOAL #3   Title  Pt LE strength to be at least a 4+/5 to allow pt to get up from a low sofa without difficulty.    Time 6   Period Weeks   Status Achieved     PT LONG TERM GOAL #4   Title PT Single leg stance to be at least 30 bilaterally to allow pt to feel confident walking over uneven terrain to feed the chickens    Baseline 15 sec  atleast    Time 6   Period Weeks   Status Partially Met               Plan - 01/01/2017 1530    Clinical Impression Statement Martha Orr has made great progress since beginning PT with improved BLE strength, ROM, balance and functional mobility. She demonstrates B knee ROM to atleast 115 degrees flexion and lacking only about 5 degrees of knee extension on the Lt. She has returned to almost all activities without difficulty and should continue to see improvements in knee ROM as she performs her HEP. At this time, she is pleased with her progress and has met all of her approved visits during this POC. She is in agreement with d/c from PT to allow her to perform her HEP independently.   Rehab Potential Good   PT Frequency 3x / week   PT Duration 6 weeks   PT Treatment/Interventions ADLs/Self Care Home Management;Cryotherapy;Associate Professor;Therapeutic activities;Therapeutic exercise;Balance training;Functional mobility training;Manual techniques;Manual lymph drainage;Passive range of motion;Moist Heat   PT Next Visit Plan d/c this visit   PT Home Exercise Plan seated knee flexion stretch, knee drive, knee extension stretch, sit to stand    Consulted and Agree with Plan of Care Patient;Family member/caregiver   Family Member Consulted husband      Patient will benefit from skilled therapeutic intervention in order to improve the following deficits and impairments:  Abnormal gait, Decreased activity tolerance, Decreased balance, Decreased range of motion, Decreased strength, Difficulty walking, Increased edema, Decreased scar mobility, Increased fascial restricitons, Pain  Visit Diagnosis: Acute pain of left knee  Acute pain of right knee  Unsteadiness on feet  Stiffness of right knee, not elsewhere classified  Stiffness of left knee, not elsewhere classified       G-Codes - 01/01/17 1612    Functional Assessment Tool Used FOTO: 10% limited     Functional Limitation Mobility: Walking and moving around   Mobility: Walking and Moving Around Goal Status [phone]) At least 1 percent but less than 20 percent impaired, limited or restricted   Mobility: Walking and Moving Around Discharge Status [phone]) At least 1 percent but less than 20 percent impaired, limited or restricted      Problem List Patient Active Problem List   Diagnosis Date Noted  . Bilateral primary osteoarthritis of knee 10/12/2016    PHYSICAL THERAPY DISCHARGE SUMMARY  Visits from Start of Care: 12  Current functional level related to goals / functional outcomes: Knee ROM lacking 0-5 degrees and able to flex up to 115 degrees on BLE. 5/5 strength, full activity tolerance. Independent with HEP   Remaining deficits: Knee ROM deficits which should continue to improve with HEP adherence at home.   Education / Equipment: discussed goals/progress; encouraged continued HEP adherence to progress knee ROM; encouraged pt moving her seat while on the bike to carry over into her upright biking  Plan: Patient agrees to discharge.  Patient goals were met. Patient is being discharged due to meeting the stated rehab goals.  ?????  4:15 PM,12/13/16 Elly Modena PT, DPT [facility] [place] [address] [place], [place], [address] Phone: [phone]   Fax:  [phone]  Name: Martha Orr MRN: [id number] Date of Birth: Oct 06, 1956

## 2017-10-25 NOTE — Pre-Procedure Instructions (Signed)
Martha Orr  10/25/2017      [place], [place] [address] Phone: [phone] Fax: [phone]    Your procedure is scheduled on Tuesday 11/01/2017.  Report to [facility] Admitting at 1200 P.M.  Call this number if you have problems the morning of surgery:  [phone]   Remember:  Do not eat food or drink liquids after midnight.  Continue all other medications as directed by your physician except follow these medication instructions before surgery   Take these medicines the morning of surgery with A SIP OF WATER: Escitalopram (Lexapro) Levothyroxine (Synthroid)  7 days prior to surgery STOP taking any Aspirin (unless otherwise instructed by your surgeon), Aleve, Naproxen, Ibuprofen, Motrin, Advil, Goody's, BC's, all herbal medications, fish oil, and all vitamins - including Duexis    Do not wear jewelry, make-up or nail polish.  Do not wear lotions, powders, or perfumes, or deodorant.  Do not shave 48 hours prior to surgery.    Do not bring valuables to the hospital.  [facility] is not responsible for any belongings or valuables.  Contacts, eyeglasses, dentures or bridgework may not be worn into surgery.  Leave your suitcase in the car.  After surgery it may be brought to your room.  For patients admitted to the hospital, discharge time will be determined by your treatment team.  Patients discharged the day of surgery will not be allowed to drive home.   Name and phone number of your driver:    Special instructions:   [facility]- Preparing For Surgery  Before surgery, you can play an important role. Because skin is not sterile, your skin needs to be as free of germs as possible. You can reduce the number of germs on your skin by washing with CHG (chlorahexidine gluconate) Soap before surgery.  CHG is an antiseptic cleaner which kills germs and bonds with the skin to continue  killing germs even after washing.  Please do not use if you have an allergy to CHG or antibacterial soaps. If your skin becomes reddened/irritated stop using the CHG.  Do not shave (including legs and underarms) for at least 48 hours prior to first CHG shower. It is OK to shave your face.  Please follow these instructions carefully.   1. Shower the NIGHT BEFORE SURGERY and the MORNING OF SURGERY with CHG.   2. If you chose to wash your hair, wash your hair first as usual with your normal shampoo.  3. After you shampoo, rinse your hair and body thoroughly to remove the shampoo.  4. Use CHG as you would any other liquid soap. You can apply CHG directly to the skin and wash gently with a scrungie or a clean washcloth.   5. Apply the CHG Soap to your body ONLY FROM THE NECK DOWN.  Do not use on open wounds or open sores. Avoid contact with your eyes, ears, mouth and genitals (private parts). Wash Face and genitals (private parts)  with your normal soap.  6. Wash thoroughly, paying special attention to the area where your surgery will be performed.  7. Thoroughly rinse your body with warm water from the neck down.  8. DO NOT shower/wash with your normal soap after using and rinsing off the CHG Soap.  9. Pat yourself dry with a CLEAN TOWEL.  10. Wear CLEAN PAJAMAS to bed the night before surgery, wear comfortable clothes the morning of  surgery  11. Place CLEAN SHEETS on your bed the night of your first shower and DO NOT SLEEP WITH PETS.    Day of Surgery: Do not apply any deodorants/lotions. Please wear clean clothes to the hospital/surgery center.      Please read over the following fact sheets that you were given. Pain Booklet, Coughing and Deep Breathing and Surgical Site Infection Prevention

## 2017-10-26 ENCOUNTER — Encounter (HOSPITAL_COMMUNITY): Payer: Self-pay

## 2017-10-26 ENCOUNTER — Encounter (HOSPITAL_COMMUNITY)
Admission: RE | Admit: 2017-10-26 | Discharge: 2017-10-26 | Disposition: A | Discharge status: Home / Self Care | Payer: 59 | Source: Ambulatory Visit | Attending: Orthopaedic Surgery | Admitting: Orthopaedic Surgery

## 2017-10-26 DIAGNOSIS — Z01812 Encounter for preprocedural laboratory examination: Secondary | ICD-10-CM | POA: Insufficient documentation

## 2017-10-26 LAB — BASIC METABOLIC PANEL
Anion gap: 7 (ref 5–15)
BUN: 9 mg/dL (ref 6–20)
CO2: 26 mmol/L (ref 22–32)
CREATININE: 0.68 mg/dL (ref 0.44–1.00)
Calcium: 8.9 mg/dL (ref 8.9–10.3)
Chloride: 106 mmol/L (ref 101–111)
GFR calc Af Amer: >60 mL/min (ref >60)
GLUCOSE: 100 mg/dL — AB (ref 65–99)
Potassium: 3.7 mmol/L (ref 3.5–5.1)
SODIUM: 139 mmol/L (ref 135–145)

## 2017-10-26 LAB — CBC
HEMATOCRIT: 38.8 % (ref 36.0–46.0)
Hemoglobin: 13.3 g/dL (ref 12.0–15.0)
MCH: 30.6 pg (ref 26.0–34.0)
MCHC: 34.3 g/dL (ref 30.0–36.0)
MCV: 89.2 fL (ref 78.0–100.0)
PLATELETS: 210 10*3/uL (ref 150–400)
RBC: 4.35 MIL/uL (ref 3.87–5.11)
RDW: 13.1 % (ref 11.5–15.5)
WBC: 4.7 10*3/uL (ref 4.0–10.5)

## 2017-10-26 NOTE — Progress Notes (Signed)
PCP: Dr. Valere Dross in [place], [place]  No cardiologist

## 2017-10-27 NOTE — Progress Notes (Addendum)
Anesthesia chart review: Patient is a 61 year old female scheduled for left knee arthroscopy on 11/01/17 by Dr. Melrose Nakayama.    History includes never smoker, hypercholesterolemia, hypothyroidism, depression, microscopic hematuria, chronic back pain, arthritis, appendectomy, hysterectomy, tonsillectomy, cholecystectomy, nasal sinus surgery, bialteral TKA 10/12/16.  PCP is Dr. Darlina Sicilian in [place], [place] ([facility]), records requested.   Meds include Duexis, Lexapro, levothyroxine, Crestor, Zanaflex.  BP 134/86   Pulse 72   Temp 36.8 C   Resp 20   Ht 5' 6.5" (1.689 m)   Wt 191 lb 6.4 oz (86.8 kg)   SpO2 99%   BMI 30.43 kg/m   EKG (Dr. Vista Deck): Requested.  She thought she had a stress test several years ago. According to Dr. Daralene Milch 08/04/16 office note (scanned under Media tab; Correspondence, 10/12/16), patient had an echocardiogram done on 03/19/13 (Dr. Tarri Fuller) that showed: "Left ventricle: The cavity size was normal. Wall thickness was mildly increased. Systolic function was normal. The estimated ejection fraction was in the range of 55-65%. Right ventricle: Systolic function was normal. Descending aorta: The ascending aorta was mildly dilated. Aortic valve: Trileaflet. Cusp separation was normal. There was no stenosis. Mitral valve: Leaflet separation was normal. Trivial regurgitation."  Preoperative CBC and BMET noted.   Will follow-up once EKG received.  [facility] Short Stay Center/Anesthesiology Phone [phone] 10/27/2017 4:28 PM  Addendum: Records received from [place] ([place]. Patient was last seen by Dr. Vista Deck on 09/28/17 for annual physical exam. Last EKG there was from 08/04/16 and showed: SB at 58 bpm, possible anterior infarct (age undetermined), lateral T wave changes are non-specific. Currently, there are no comparison tracings, but patient has since undergone bilateral TKA's. Plan for updated EKG prior to surgery  since last tracing is > 84 year old and patient with history of hypercholesterolemia. (Addendum 11/02/17 9:12 AM: 11/01/17 EKGs X 2 showed NSR, non-specific T wave abnormality, prolonged QT. Patient did undergo shoulder surgery and was discharged home. Since QT interval was prolonged, I faxed tracing to Dr. Darlina Sicilian for review at [phone] with confirmation.)   Myra Gianotti, PA-C [facility] Short Stay Center/Anesthesiology Phone [phone] 10/31/2017 12:18 PM

## 2017-10-31 NOTE — H&P (Signed)
Martha Orr is an 61 y.o. female.   Chief Complaint: Left knee pain HPI: Martha Orr is in with Martha Orr.  She is quite happy with her replaced right knee but the left side still bothers her a bit.  She has some stiffness which has not responded to aggressive therapy.  We gave her an injection last month and that did not help significantly either.  She has some anterior aspect pain.     Past Medical History:  Diagnosis Date  . Arthritis   . Chronic back pain    buldging disc  . Depression    takes Lexapro daily  . Family history of adverse reaction to anesthesia    sister gets sick after anesthesia  . High cholesterol    takes Crestor daily  . History of colon polyps    benign  . History of shingles   . Hypothyroidism    takes Synthroid daily  . Joint pain   . Joint swelling   . Microscopic hematuria    states her entire life and family is the same way    Past Surgical History:  Procedure Laterality Date  . ABDOMINAL HYSTERECTOMY    . APPENDECTOMY    . BUNIONECTOMY Bilateral   . CARPAL TUNNEL RELEASE Bilateral   . CHOLECYSTECTOMY    . COLONOSCOPY    . KNEE ARTHROSCOPY Right   . knot removed from right breast    . NASAL SINUS SURGERY    . TONSILLECTOMY      No family history on file. Social History:  reports that  has never smoked. she has never used smokeless tobacco. She reports that she drinks alcohol. She reports that she does not use drugs.  Allergies:  Allergies  Allergen Reactions  . No Known Allergies     No medications prior to admission.    No results found for this or any previous visit (from the past 48 hour(s)). No results found.  Review of Systems  Musculoskeletal: Positive for joint pain.       Left knee  All other systems reviewed and are negative.   There were no vitals taken for this visit. Physical Exam  Constitutional: She is oriented to person, place, and time. She appears well-developed and well-nourished.  HENT:  Head: Normocephalic  and atraumatic.  Eyes: Pupils are equal, round, and reactive to light.  Neck: Normal range of motion.  Cardiovascular: Normal rate.  Respiratory: Effort normal.  GI: Soft.  Musculoskeletal:  Left knee motion is about 5-90 while the opposite side flexes more.  She has anteromedial pain but there is no effusion.  She has good stability.  Calf is soft and nontender.   Neurological: She is alert and oriented to person, place, and time.  Skin: Skin is warm and dry.  Psychiatric: She has a normal mood and affect. Her behavior is normal. Judgment and thought content normal.     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  She is now a year from her knee replacement.  She feels a catch and has some stiffness.  She certainly might have some bands of scar tissue causing her difficulty.  At this point it seems reasonable to go forward with an arthroscopy on the left.  I told her this might help 2 out of 3 people but there are certainly no guarantees. I reviewed risks of anesthesia and infection as well as potential for DVT  related to a knee arthroscopy.  I've stressed the importance of some postoperative physical therapy to optimize results and we will try to set up an appointment.  Two to four  weeks for recovery would be typical but that is a little variable.  Martha Orr, [name], PA-C 10/31/2017, 2:53 PM

## 2017-11-01 ENCOUNTER — Encounter (HOSPITAL_COMMUNITY): Payer: Self-pay | Admitting: Certified Registered"

## 2017-11-01 ENCOUNTER — Ambulatory Visit (HOSPITAL_COMMUNITY): Payer: 59 | Admitting: Emergency Medicine

## 2017-11-01 ENCOUNTER — Encounter (HOSPITAL_COMMUNITY)
Admission: RE | Disposition: A | Discharge status: Home / Self Care | Payer: Self-pay | Source: Ambulatory Visit | Attending: Orthopaedic Surgery

## 2017-11-01 ENCOUNTER — Ambulatory Visit (HOSPITAL_COMMUNITY)
Admission: RE | Admit: 2017-11-01 | Discharge: 2017-11-01 | Disposition: A | Discharge status: Home / Self Care | Payer: 59 | Source: Ambulatory Visit | Attending: Orthopaedic Surgery | Admitting: Orthopaedic Surgery

## 2017-11-01 ENCOUNTER — Ambulatory Visit (HOSPITAL_COMMUNITY): Payer: 59 | Admitting: Vascular Surgery

## 2017-11-01 DIAGNOSIS — Z8619 Personal history of other infectious and parasitic diseases: Secondary | ICD-10-CM | POA: Diagnosis not present

## 2017-11-01 DIAGNOSIS — E7800 Pure hypercholesterolemia, unspecified: Secondary | ICD-10-CM | POA: Insufficient documentation

## 2017-11-01 DIAGNOSIS — M25662 Stiffness of left knee, not elsewhere classified: Secondary | ICD-10-CM | POA: Diagnosis present

## 2017-11-01 DIAGNOSIS — E039 Hypothyroidism, unspecified: Secondary | ICD-10-CM | POA: Insufficient documentation

## 2017-11-01 DIAGNOSIS — F329 Major depressive disorder, single episode, unspecified: Secondary | ICD-10-CM | POA: Diagnosis not present

## 2017-11-01 DIAGNOSIS — Z79899 Other long term (current) drug therapy: Secondary | ICD-10-CM | POA: Diagnosis not present

## 2017-11-01 HISTORY — PX: KNEE ARTHROSCOPY: SHX127

## 2017-11-01 SURGERY — ARTHROSCOPY, KNEE
Anesthesia: General | Site: Knee | Laterality: Left

## 2017-11-01 MED ORDER — PROPOFOL 10 MG/ML IV BOLUS
INTRAVENOUS | Status: DC | PRN
  Administered 2017-11-01: 150 mg via INTRAVENOUS

## 2017-11-01 MED ORDER — ACETAMINOPHEN 500 MG PO TABS
ORAL_TABLET | ORAL | Status: AC
  Administered 2017-11-01: 1000 mg
  Filled 2017-11-01: qty 2

## 2017-11-01 MED ORDER — 0.9 % SODIUM CHLORIDE (POUR BTL) OPTIME
TOPICAL | Status: DC | PRN
  Administered 2017-11-01: 1000 mL

## 2017-11-01 MED ORDER — PROPOFOL 10 MG/ML IV BOLUS
INTRAVENOUS | Status: AC
  Filled 2017-11-01: qty 20

## 2017-11-01 MED ORDER — MIDAZOLAM HCL 2 MG/2ML IJ SOLN
INTRAMUSCULAR | Status: AC
  Filled 2017-11-01: qty 2

## 2017-11-01 MED ORDER — ONDANSETRON HCL 4 MG/2ML IJ SOLN
INTRAMUSCULAR | Status: DC | PRN
  Administered 2017-11-01: 4 mg via INTRAVENOUS

## 2017-11-01 MED ORDER — SODIUM CHLORIDE 0.9 % IR SOLN
Status: DC | PRN
Start: 2017-11-01 — End: 2017-11-01
  Administered 2017-11-01 (×2): 3000 mL

## 2017-11-01 MED ORDER — LIDOCAINE 2% (20 MG/ML) 5 ML SYRINGE
INTRAMUSCULAR | Status: AC
  Filled 2017-11-01: qty 5

## 2017-11-01 MED ORDER — CHLORHEXIDINE GLUCONATE 4 % EX LIQD: 60.0000 mL | Freq: Once | CUTANEOUS | Status: DC

## 2017-11-01 MED ORDER — ONDANSETRON HCL 4 MG/2ML IJ SOLN
INTRAMUSCULAR | Status: AC
  Filled 2017-11-01: qty 2

## 2017-11-01 MED ORDER — FENTANYL CITRATE (PF) 250 MCG/5ML IJ SOLN
INTRAMUSCULAR | Status: AC
  Filled 2017-11-01: qty 5

## 2017-11-01 MED ORDER — CEFAZOLIN SODIUM-DEXTROSE 2-4 GM/100ML-% IV SOLN
2.0000 g | INTRAVENOUS | Status: AC
  Administered 2017-11-01: 2 g via INTRAVENOUS

## 2017-11-01 MED ORDER — HYDROMORPHONE HCL 1 MG/ML IJ SOLN
INTRAMUSCULAR | Status: AC
  Filled 2017-11-01: qty 1

## 2017-11-01 MED ORDER — DEXAMETHASONE SODIUM PHOSPHATE 10 MG/ML IJ SOLN
INTRAMUSCULAR | Status: DC | PRN
  Administered 2017-11-01: 10 mg via INTRAVENOUS

## 2017-11-01 MED ORDER — HYDROCODONE-ACETAMINOPHEN 5-325 MG PO TABS: 1.0000 | ORAL_TABLET | ORAL | 0 refills | Status: DC | PRN

## 2017-11-01 MED ORDER — CEFAZOLIN SODIUM-DEXTROSE 2-4 GM/100ML-% IV SOLN
INTRAVENOUS | Status: AC
  Filled 2017-11-01: qty 100

## 2017-11-01 MED ORDER — HYDROMORPHONE HCL 1 MG/ML IJ SOLN
0.2500 mg | INTRAMUSCULAR | Status: DC | PRN
  Administered 2017-11-01: 0.5 mg via INTRAVENOUS

## 2017-11-01 MED ORDER — BUPIVACAINE-EPINEPHRINE (PF) 0.5% -1:200000 IJ SOLN
INTRAMUSCULAR | Status: AC
  Filled 2017-11-01: qty 30

## 2017-11-01 MED ORDER — LACTATED RINGERS IV SOLN
INTRAVENOUS | Status: DC
  Administered 2017-11-01: 13:00:00 via INTRAVENOUS

## 2017-11-01 MED ORDER — EPHEDRINE SULFATE-NACL 50-0.9 MG/10ML-% IV SOSY
PREFILLED_SYRINGE | INTRAVENOUS | Status: DC | PRN
  Administered 2017-11-01: 10 mg via INTRAVENOUS

## 2017-11-01 MED ORDER — GABAPENTIN 300 MG PO CAPS
ORAL_CAPSULE | ORAL | Status: AC
  Administered 2017-11-01: 300 mg
  Filled 2017-11-01: qty 1

## 2017-11-01 MED ORDER — FENTANYL CITRATE (PF) 100 MCG/2ML IJ SOLN
INTRAMUSCULAR | Status: DC | PRN
  Administered 2017-11-01 (×3): 25 ug via INTRAVENOUS

## 2017-11-01 MED ORDER — MIDAZOLAM HCL 2 MG/2ML IJ SOLN
INTRAMUSCULAR | Status: DC | PRN
  Administered 2017-11-01: 2 mg via INTRAVENOUS

## 2017-11-01 MED ORDER — STERILE WATER FOR IRRIGATION IR SOLN
Status: DC | PRN
  Administered 2017-11-01: 1000 mL

## 2017-11-01 MED ORDER — LIDOCAINE 2% (20 MG/ML) 5 ML SYRINGE
INTRAMUSCULAR | Status: DC | PRN
  Administered 2017-11-01: 20 mg via INTRAVENOUS

## 2017-11-01 MED ORDER — BUPIVACAINE-EPINEPHRINE 0.5% -1:200000 IJ SOLN
INTRAMUSCULAR | Status: DC | PRN
  Administered 2017-11-01: 10 mL

## 2017-11-01 MED ORDER — LACTATED RINGERS IV SOLN: INTRAVENOUS | Status: DC

## 2017-11-01 MED ORDER — DEXAMETHASONE SODIUM PHOSPHATE 10 MG/ML IJ SOLN
INTRAMUSCULAR | Status: AC
  Filled 2017-11-01: qty 1

## 2017-11-01 SURGICAL SUPPLY — 40 items
BANDAGE ACE 6X5 VEL STRL LF (GAUZE/BANDAGES/DRESSINGS) ×3 IMPLANT
BANDAGE ELASTIC 6 VELCRO ST LF (GAUZE/BANDAGES/DRESSINGS) ×3 IMPLANT
BLADE GREAT WHITE 4.2 (BLADE) ×2 IMPLANT
BLADE GREAT WHITE 4.2MM (BLADE) ×1
BLADE SURG 11 STRL SS (BLADE) IMPLANT
BNDG GAUZE ELAST 4 BULKY (GAUZE/BANDAGES/DRESSINGS) ×3 IMPLANT
COVER SURGICAL LIGHT HANDLE (MISCELLANEOUS) IMPLANT
CUFF TOURNIQUET SINGLE 34IN LL (TOURNIQUET CUFF) IMPLANT
CUFF TOURNIQUET SINGLE 44IN (TOURNIQUET CUFF) IMPLANT
DRAPE ARTHROSCOPY W/POUCH 114 (DRAPES) ×3 IMPLANT
DRAPE U-SHAPE 47X51 STRL (DRAPES) ×3 IMPLANT
DRSG EMULSION OIL 3X3 NADH (GAUZE/BANDAGES/DRESSINGS) ×3 IMPLANT
DRSG PAD ABDOMINAL 8X10 ST (GAUZE/BANDAGES/DRESSINGS) ×3 IMPLANT
DURAPREP 26ML APPLICATOR (WOUND CARE) ×3 IMPLANT
GAUZE SPONGE 4X4 12PLY STRL (GAUZE/BANDAGES/DRESSINGS) ×3 IMPLANT
GAUZE SPONGE 4X4 12PLY STRL LF (GAUZE/BANDAGES/DRESSINGS) ×3 IMPLANT
GLOVE BIO SURGEON STRL SZ8 (GLOVE) ×6 IMPLANT
GLOVE BIOGEL PI IND STRL 8 (GLOVE) ×1 IMPLANT
GLOVE BIOGEL PI INDICATOR 8 (GLOVE) ×2
GOWN STRL REUS W/ TWL LRG LVL3 (GOWN DISPOSABLE) ×3 IMPLANT
GOWN STRL REUS W/ TWL XL LVL3 (GOWN DISPOSABLE) ×3 IMPLANT
GOWN STRL REUS W/TWL 2XL LVL3 (GOWN DISPOSABLE) ×3 IMPLANT
GOWN STRL REUS W/TWL LRG LVL3 (GOWN DISPOSABLE) ×6
GOWN STRL REUS W/TWL XL LVL3 (GOWN DISPOSABLE) ×6
IV NS IRRIG 3000ML ARTHROMATIC (IV SOLUTION) ×6 IMPLANT
KIT ROOM TURNOVER OR (KITS) ×3 IMPLANT
MANIFOLD NEPTUNE II (INSTRUMENTS) ×3 IMPLANT
NEEDLE 18GX1X1/2 (RX/OR ONLY) (NEEDLE) ×3 IMPLANT
NEEDLE 22X1 1/2 (OR ONLY) (NEEDLE) ×3 IMPLANT
NEEDLE SPNL 18GX3.5 QUINCKE PK (NEEDLE) ×3 IMPLANT
PACK ARTHROSCOPY DSU (CUSTOM PROCEDURE TRAY) ×3 IMPLANT
PAD ABD 8X10 STRL (GAUZE/BANDAGES/DRESSINGS) ×3 IMPLANT
PAD ARMBOARD 7.5X6 YLW CONV (MISCELLANEOUS) ×3 IMPLANT
SET ARTHROSCOPY TUBING (MISCELLANEOUS) ×2
SET ARTHROSCOPY TUBING LN (MISCELLANEOUS) ×1 IMPLANT
SPONGE LAP 18X18 X RAY DECT (DISPOSABLE) ×3 IMPLANT
SUT ETHILON 3 0 PS 1 (SUTURE) ×3 IMPLANT
SYR CONTROL 10ML LL (SYRINGE) ×3 IMPLANT
TOWEL OR 17X24 6PK STRL BLUE (TOWEL DISPOSABLE) ×3 IMPLANT
WATER STERILE IRR 1000ML POUR (IV SOLUTION) ×3 IMPLANT

## 2017-11-01 NOTE — Interval H&P Note (Signed)
History and Physical Interval Note:  11/01/2017 1:25 PM  Martha Orr  has presented today for surgery, with the diagnosis of STIFF LEFT TOTAL KNEE ARTHROPLASTY  The various methods of treatment have been discussed with the patient and family. After consideration of risks, benefits and other options for treatment, the patient has consented to  Procedure(s): ARTHROSCOPY LEFT KNEE (Left) as a surgical intervention .  The patient's history has been reviewed, patient examined, no change in status, stable for surgery.  I have reviewed the patient's chart and labs.  Questions were answered to the patient's satisfaction.     Martha Orr

## 2017-11-01 NOTE — Op Note (Signed)
#  [id number] 

## 2017-11-01 NOTE — Transfer of Care (Signed)
Immediate Anesthesia Transfer of Care Note  Patient: Martha Orr  Procedure(s) Performed: ARTHROSCOPY LEFT KNEE (Left Knee)  Patient Location: PACU  Anesthesia Type:General  Level of Consciousness: awake, alert  and oriented  Airway & Oxygen Therapy: Patient Spontanous Breathing and Patient connected to nasal cannula oxygen  Post-op Assessment: Report given to RN and Post -op Vital signs reviewed and stable  Post vital signs: Reviewed and stable  Last Vitals:  Vitals:   11/01/17 1232  BP: (!) 146/85  Pulse: 79  Resp: 18  Temp: 36.7 C  SpO2: 95%    Last Pain:  Vitals:   11/01/17 1232  TempSrc: Oral         Complications: No apparent anesthesia complications

## 2017-11-01 NOTE — Anesthesia Procedure Notes (Signed)
Procedure Name: LMA Insertion Date/Time: 11/01/2017 2:01 PM Performed by: Imagene Riches, CRNA Pre-anesthesia Checklist: Patient identified, Emergency Drugs available, Suction available and Patient being monitored Patient Re-evaluated:Patient Re-evaluated prior to induction Oxygen Delivery Method: Circle System Utilized Preoxygenation: Pre-oxygenation with 100% oxygen Induction Type: IV induction Ventilation: Mask ventilation without difficulty LMA: LMA inserted LMA Size: 4.0 Number of attempts: 1 Placement Confirmation: positive ETCO2 Tube secured with: Tape Dental Injury: Teeth and Oropharynx as per pre-operative assessment

## 2017-11-01 NOTE — Anesthesia Preprocedure Evaluation (Signed)
Anesthesia Evaluation  Patient identified by MRN, date of birth, ID band Patient awake    Reviewed: Allergy & Precautions, H&P , NPO status , Patient's Chart, lab work & pertinent test results  Airway Mallampati: III  TM Distance: >3 FB Neck ROM: Full    Dental no notable dental hx. (+) Teeth Intact, Dental Advisory Given   Pulmonary neg pulmonary ROS,    Pulmonary exam normal breath sounds clear to auscultation       Cardiovascular negative cardio ROS   Rhythm:Regular Rate:Normal     Neuro/Psych Depression negative neurological ROS     GI/Hepatic negative GI ROS, Neg liver ROS,   Endo/Other  Hypothyroidism   Renal/GU negative Renal ROS  negative genitourinary   Musculoskeletal  (+) Arthritis , Osteoarthritis,    Abdominal   Peds  Hematology negative hematology ROS (+)   Anesthesia Other Findings   Reproductive/Obstetrics negative OB ROS                             Anesthesia Physical Anesthesia Plan  ASA: II  Anesthesia Plan: General   Post-op Pain Management:    Induction: Intravenous  PONV Risk Score and Plan: 4 or greater and Ondansetron, Dexamethasone and Midazolam  Airway Management Planned: LMA  Additional Equipment:   Intra-op Plan:   Post-operative Plan: Extubation in OR  Informed Consent: I have reviewed the patients History and Physical, chart, labs and discussed the procedure including the risks, benefits and alternatives for the proposed anesthesia with the patient or authorized representative who has indicated his/her understanding and acceptance.   Dental advisory given  Plan Discussed with: CRNA  Anesthesia Plan Comments:         Anesthesia Quick Evaluation

## 2017-11-01 NOTE — Anesthesia Postprocedure Evaluation (Signed)
Anesthesia Post Note  Patient: Martha Orr  Procedure(s) Performed: ARTHROSCOPY LEFT KNEE (Left Knee)     Patient location during evaluation: PACU Anesthesia Type: General Level of consciousness: awake and alert Pain management: pain level controlled Vital Signs Assessment: post-procedure vital signs reviewed and stable Respiratory status: spontaneous breathing, nonlabored ventilation and respiratory function stable Cardiovascular status: blood pressure returned to baseline and stable Postop Assessment: no apparent nausea or vomiting Anesthetic complications: no    Last Vitals:  Vitals:   11/01/17 1630 11/01/17 1634  BP:  125/81  Pulse: 75 73  Resp: 12 13  Temp:    SpO2: 95% 95%    Last Pain:  Vitals:   11/01/17 1605  TempSrc:   PainSc: Asleep                 Asad Keeven,W. EDMOND

## 2017-11-02 ENCOUNTER — Encounter (HOSPITAL_COMMUNITY): Payer: Self-pay | Admitting: Orthopaedic Surgery

## 2017-11-02 NOTE — Op Note (Signed)
NAME:  Martha Orr, Martha Orr                   ACCOUNT NO.:  MEDICAL RECORD NO.:  [id number]  LOCATION:                                 FACILITY:  PHYSICIAN:  Monico Blitz. Rhona Raider, M.D.     DATE OF BIRTH:  DATE OF PROCEDURE:  11/01/2017 DATE OF DISCHARGE:                              OPERATIVE REPORT   PREOPERATIVE DIAGNOSIS:  Stiff left total knee replacement.  POSTOPERATIVE DIAGNOSIS:  Stiff left total knee replacement.  PROCEDURE:  Left knee arthroscopic debridement and manipulation.  ANESTHESIA:  General.  ATTENDING SURGEON:  Monico Blitz. Rhona Raider, MD.  ASSISTANT:  Loni Dolly, PA.  INDICATION FOR PROCEDURE:  The patient is a 61 year old woman who is about a year from bilateral knee replacements.  On the right side, she is fine, but on the left she has persisted with some stiffness despite aggressive therapy.  She also has some anterior pain.  She is offered arthroscopy and manipulation at this point.  Informed operative consent was obtained after discussion of possible complications including reaction to anesthesia, infection, and fracture.  SUMMARY OF FINDINGS AND PROCEDURE:  Under general anesthesia, an arthroscopy of the left knee was performed.  She had abundant scar tissue surrounding the patella and a large spiral of scar tissue in the anterior position obscuring the spacer.  A thorough debridement was removed removing all of these bands of scar tissue.  We then manipulated her knee and improved her flexion from 90 to maybe 105 degrees.  She was discharged home the same day.  DESCRIPTION OF PROCEDURE:  The patient was taken to the operating suite where general anesthetic was applied without difficulty.  She was positioned supine and prepped and draped in a normal sterile fashion. After the administration of preop IV Kefzol and an appropriate time-out, an arthroscopy of the left knee was performed through a total of 2 portals.  Findings were as noted above and procedure  consisted of the extensive debridement of scar tissue and manipulation.  The knee was irrigated once again followed by removal of arthroscopic equipment.  The two portals were closed loosely with nylon.  Adaptic was applied followed by dry gauze and loose Ace wrap.  Estimated blood loss and intraoperative fluids can be obtained from anesthesia records.  DISPOSITION:  The patient was extubated in the operating room and taken to the recovery room in stable condition.  She was to go home same-day and follow up in the office closely.  I will contact her by phone tonight.     Monico Blitz Rhona Raider, M.D.     PGD/MEDQ  D:  11/01/2017  T:  11/02/2017  Job:  [id number]

## 2018-09-13 IMAGING — CR DG CHEST 2V
2 series · 2 of 2 positions shown · non-contrast
Comparison: None in PACs

CLINICAL DATA: Preoperative exam prior to knee replacement. No
cardiac complaints or history of abnormality.

EXAM:
CHEST  2 VIEW

[w chest pa]
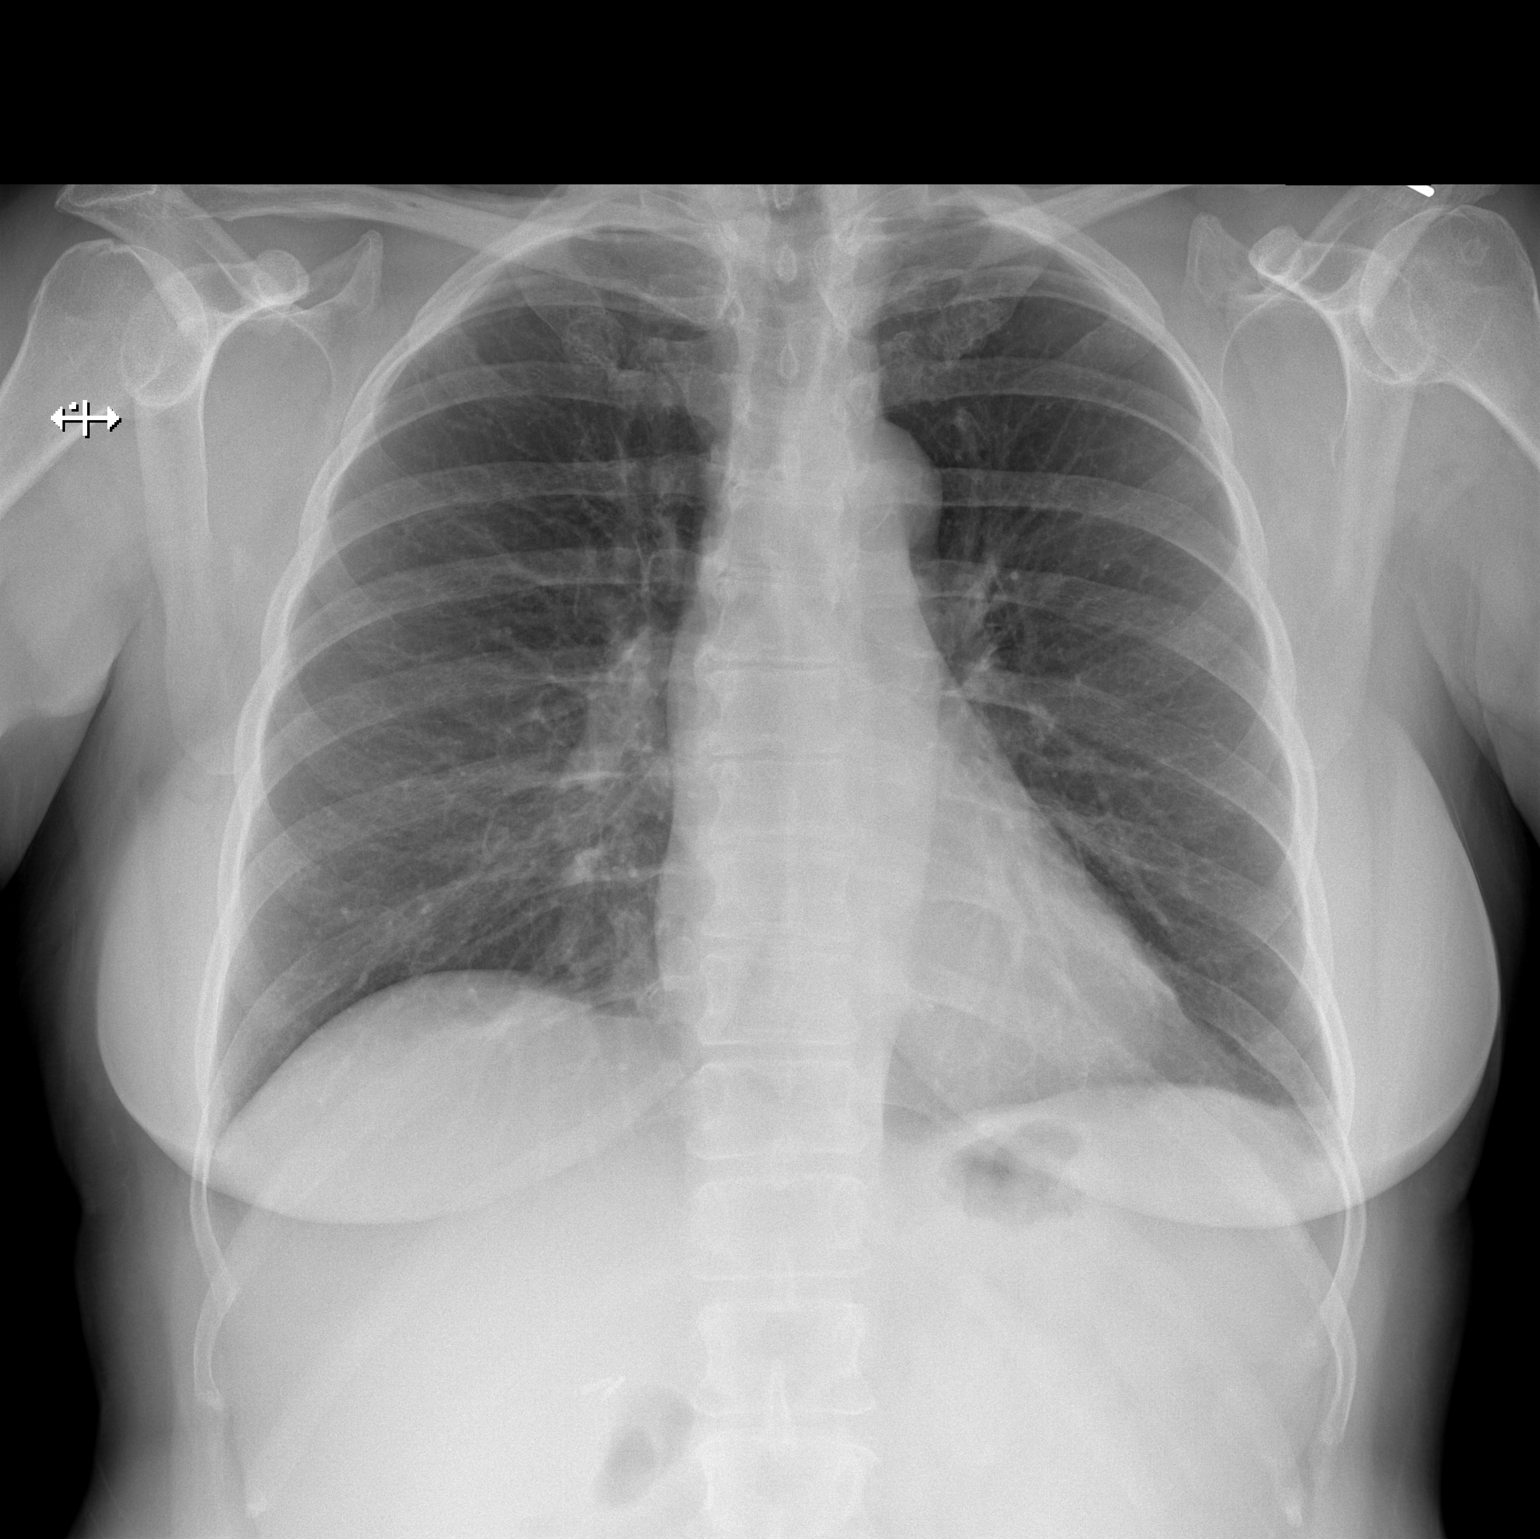

[w chest lat]
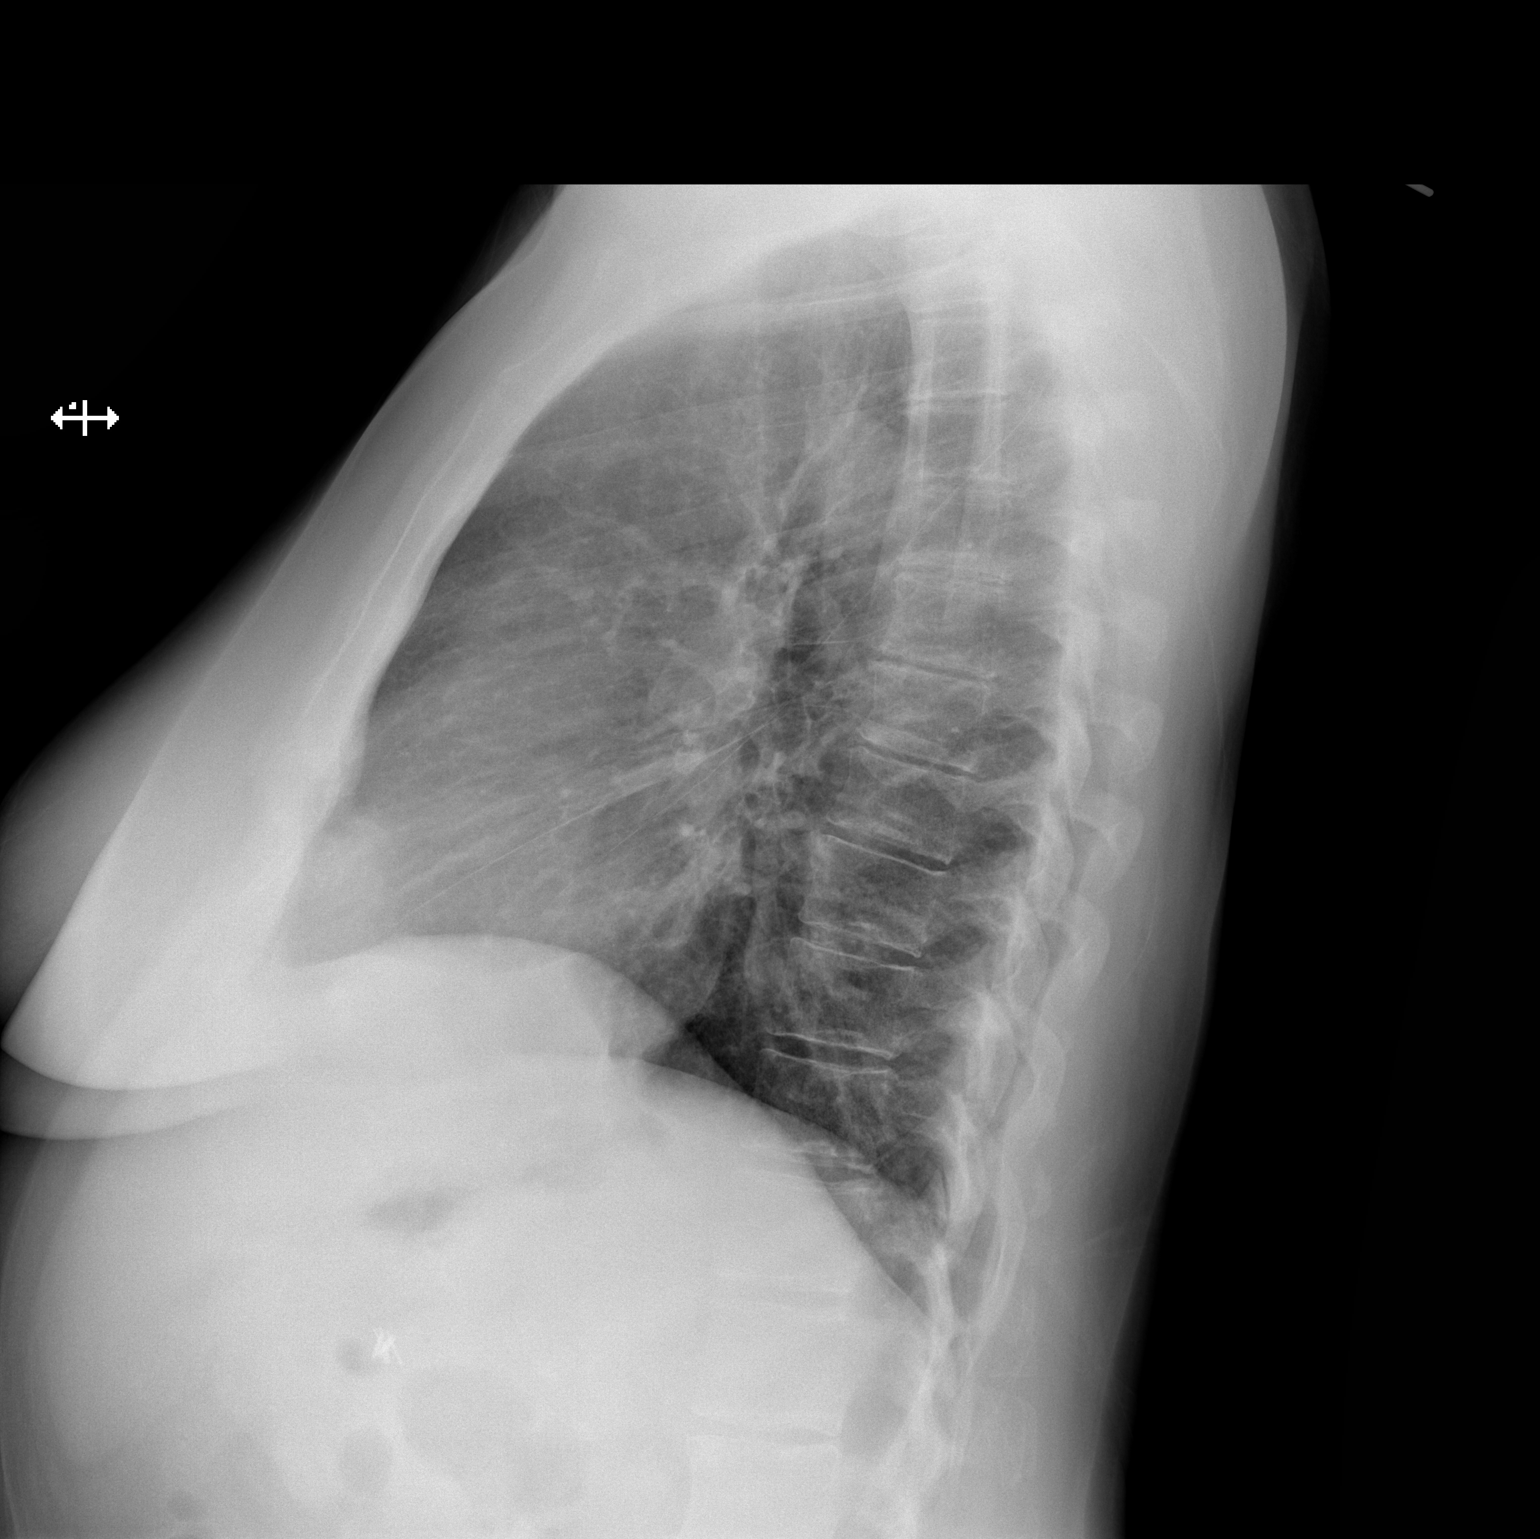

[2 of 2 positions shown; findings below may reference images not displayed]

FINDINGS: The lungs are adequately inflated and clear. The heart and pulmonary
vascularity are normal. The mediastinum is normal in width. There is
calcification in the wall of the aortic arch. There is mild
multilevel degenerative disc disease of the thoracic spine.
IMPRESSION: There is no active cardiopulmonary disease.

Aortic atherosclerosis.

## 2018-09-23 IMAGING — CR DG CHEST 1V PORT
1 series · 1 of 1 positions shown · non-contrast
Comparison: October 04, 2016

CLINICAL DATA: Cough.  Recent total knee replacement

EXAM:
PORTABLE CHEST 1 VIEW

[AP]
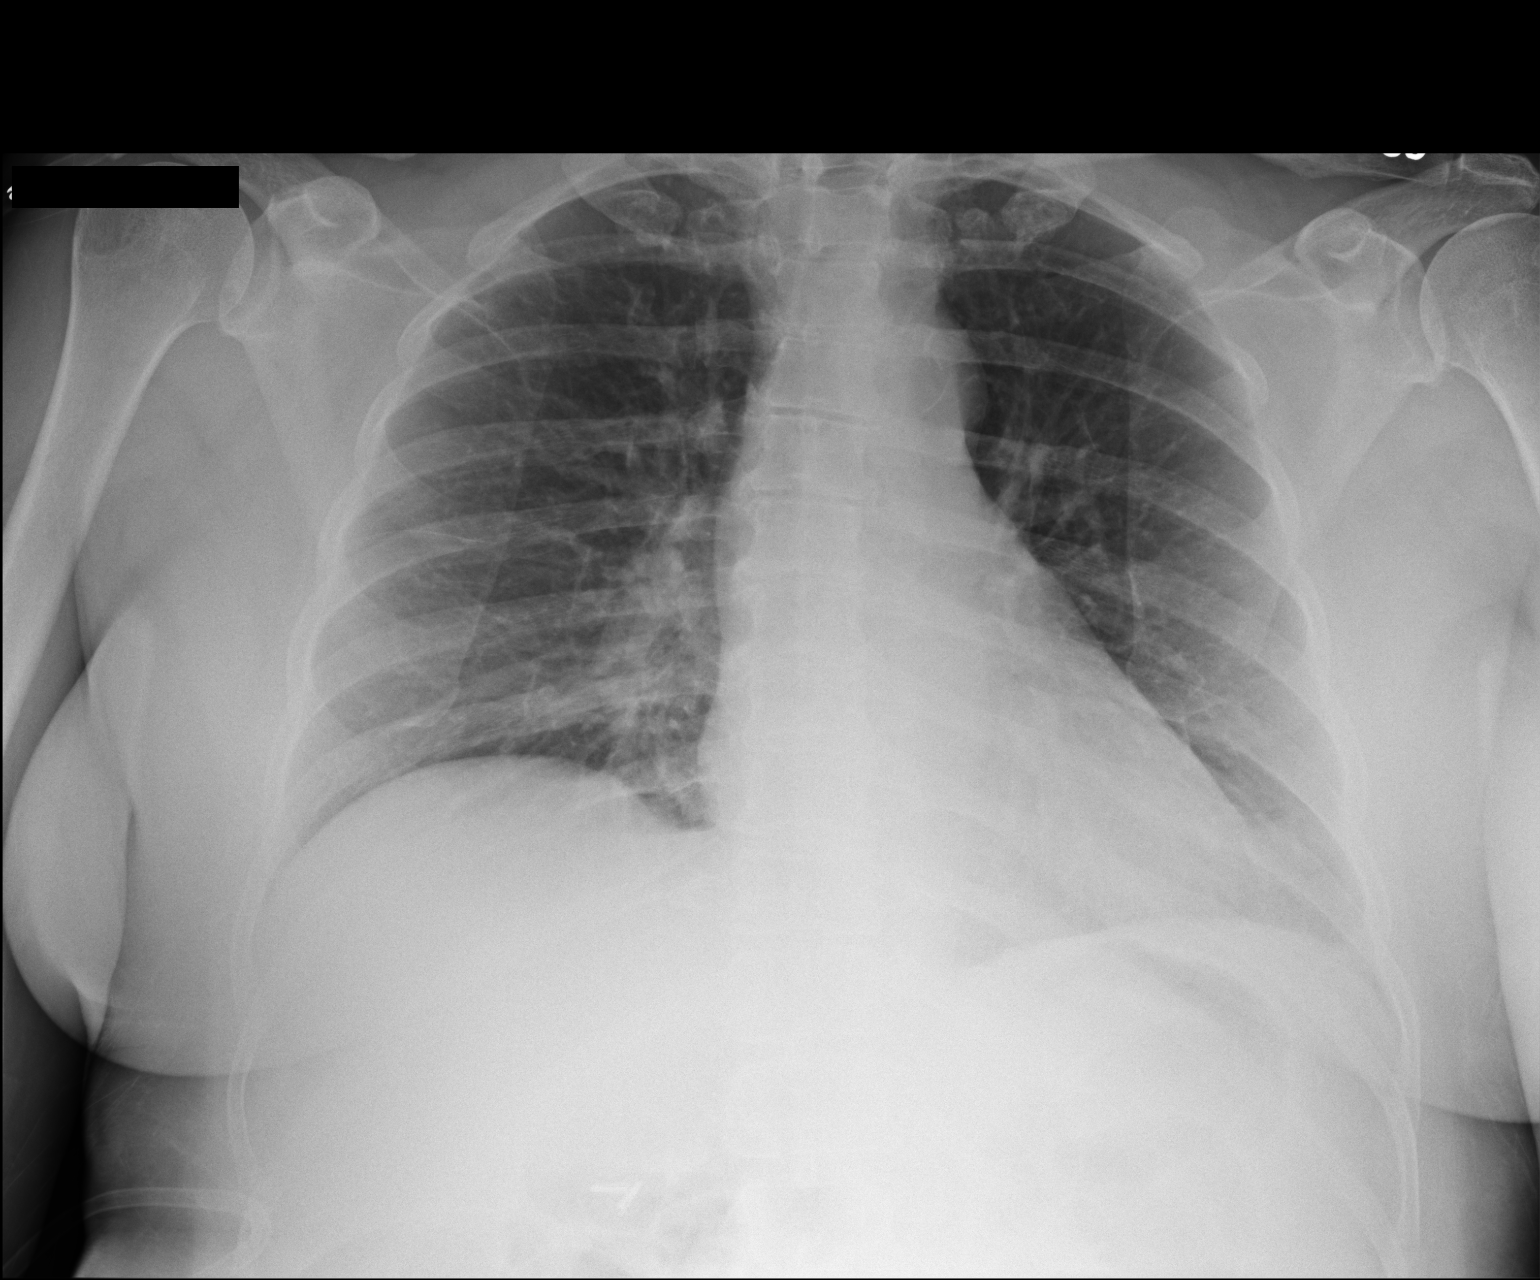

[1 of 1 positions shown; findings below may reference images not displayed]

FINDINGS: There is no edema or consolidation. The heart size and pulmonary
vascularity are normal. No adenopathy. There is atherosclerotic
calcification in aorta. No bone lesions.
IMPRESSION: Aortic atherosclerosis.  No edema or consolidation.

## 2018-12-04 LAB — HM MAMMOGRAPHY

## 2019-07-09 ENCOUNTER — Other Ambulatory Visit: Payer: Self-pay

## 2019-07-09 ENCOUNTER — Ambulatory Visit: Payer: Medicare Other

## 2019-08-02 ENCOUNTER — Encounter: Payer: Self-pay | Admitting: Family Medicine

## 2019-08-02 ENCOUNTER — Ambulatory Visit (INDEPENDENT_AMBULATORY_CARE_PROVIDER_SITE_OTHER): Payer: Medicare Other | Admitting: Family Medicine

## 2019-08-02 ENCOUNTER — Other Ambulatory Visit: Payer: Self-pay

## 2019-08-02 VITALS — BP 120/77 | HR 71 | Temp 98.2°F | Ht 66.5 in | Wt 183.4 lb

## 2019-08-02 DIAGNOSIS — G2581 Restless legs syndrome: Secondary | ICD-10-CM

## 2019-08-02 DIAGNOSIS — E039 Hypothyroidism, unspecified: Secondary | ICD-10-CM

## 2019-08-02 DIAGNOSIS — Z6829 Body mass index (BMI) 29.0-29.9, adult: Secondary | ICD-10-CM

## 2019-08-02 DIAGNOSIS — E785 Hyperlipidemia, unspecified: Secondary | ICD-10-CM | POA: Diagnosis not present

## 2019-08-02 DIAGNOSIS — M1990 Unspecified osteoarthritis, unspecified site: Secondary | ICD-10-CM

## 2019-08-02 DIAGNOSIS — F419 Anxiety disorder, unspecified: Secondary | ICD-10-CM | POA: Diagnosis not present

## 2019-08-02 DIAGNOSIS — Z8619 Personal history of other infectious and parasitic diseases: Secondary | ICD-10-CM

## 2019-08-02 DIAGNOSIS — F325 Major depressive disorder, single episode, in full remission: Secondary | ICD-10-CM | POA: Diagnosis not present

## 2019-08-02 DIAGNOSIS — E663 Overweight: Secondary | ICD-10-CM

## 2019-08-02 NOTE — Progress Notes (Signed)
Chief Complaint:  Martha Orr is a 63 y.o. female who presents today with a chief complaint of diarrhea and to establish care.   Assessment/Plan:  Diarrhea No red flags.  Likely due to status post cholecystectomy.  Recommended diet high in fiber.  Recommended use of Imodium as needed.  Discussed reasons to return to care.  Follow-up as needed.  Major depressive disorder, single episode, in remission ([place]) Stable.  Continue Lexapro 20 mg daily.  Anxiety Stable.  Continue Lexapro 20 mg daily.   Dyslipidemia Obtain records from previous PCP.  Continue Crestor 20 mg daily.  Check lipid panel with next draw.  Hypothyroidism Continue levothyroxine 112 mcg daily.  Check TSH with next blood draw.  H/O cold sores No current outbreak.  Continue acyclovir as needed.  Osteoarthritis Continue management per orthopedics.  Recommended that she can use Pennsaid as needed at various sites of her body including her feet, toes, and neck.  Restless legs Recommended starting ferrous sulfate 325 mg every other day.  Will avoid prescription medications for the time being.      Subjective:  HPI:  Diarrhea Symptoms started several years ago after having her gallbladder taken out.  She has loose stools that occur typically after eating.  No associated abdominal pain.  No melena or hematochezia.  No nausea or vomiting.  Pepto-Bismol helps.  No other treatments tried.  No fevers or chills.  She has had some increased difficulty hearing recently. Used to work in a [employer] and has had several ear infections as a kid.  She has also had some issues with restless legs for the past couple of years after her knee replacement surgery.  Takes Advil PM which helps.  Also reports pain in her right toe and left shoulder.   Her stable, chronic medical conditions are outlined below:  # Anxiety/ Depression - On lexapro 20mg  daily and tolerating well - ROS: NO reported SI or HI  #  Dyslipidemia - On crestor 20mg  daily and tolerating well - ROS: No reported myalgias  # Hypothyroidism - On levothyroxine 140mcg daily and tolerating well  # Cold Sores - Uses acyclovir as needed  # Osteoarthritis s/p bilateral TKA - Follows with orthopedics - Uses advil and pennsaid as needed  ROS: Per HPI, otherwise a complete review of systems was negative.   PMH:  The following were reviewed and entered/updated in epic: Past Medical History:  Diagnosis Date  . Arthritis   . Chronic back pain    buldging disc  . Depression    takes Lexapro daily  . Family history of adverse reaction to anesthesia    sister gets sick after anesthesia  . High cholesterol    takes Crestor daily  . History of colon polyps    benign  . History of shingles   . Hypothyroidism    takes Synthroid daily  . Joint pain   . Joint swelling   . Microscopic hematuria    states her entire life and family is the same way   Patient Active Problem List   Diagnosis Date Noted  . Major depressive disorder, single episode, in remission ([place]) 08/02/2019  . Anxiety 08/02/2019  . Dyslipidemia 08/02/2019  . Hypothyroidism 08/02/2019  . 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 Osteoarthritis 08/02/2019  . Restless legs 08/02/2019  . Bilateral primary osteoarthritis of knee 10/12/2016   Past Surgical History:  Procedure Laterality Date  . ABDOMINAL HYSTERECTOMY  1998  . APPENDECTOMY    .  BREAST BIOPSY    . BUNIONECTOMY Bilateral   . CARPAL TUNNEL RELEASE Bilateral   . CHOLECYSTECTOMY    . COLONOSCOPY    . KNEE ARTHROSCOPY Right   . KNEE ARTHROSCOPY Left 11/01/2017   Procedure: ARTHROSCOPY LEFT KNEE;  Surgeon: Melrose Nakayama, MD;  Location: [facility];  Service: Orthopedics;  Laterality: Left;  . knot removed from right breast    . NASAL SINUS SURGERY    . TONSILLECTOMY    . TONSILLECTOMY AND ADENOIDECTOMY  2002  . TOTAL KNEE ARTHROPLASTY Bilateral 10/12/2016   Procedure: TOTAL KNEE BILATERAL;  Surgeon: Melrose Nakayama, MD;  Location: [facility];  Service: Orthopedics;  Laterality: Bilateral;    Family History  Problem Relation Age of Onset  . Heart attack Mother   . Hyperlipidemia Mother   . Arthritis Father   . Arthritis Sister   . Arthritis Sister   . Heart attack Sister   . Arthritis Sister   . Hyperlipidemia Sister   . Stroke Sister   . Cancer Brother   . Depression Brother   . Heart attack Brother   . Heart disease Brother   . Hypertension Brother   . Hyperlipidemia Brother   . Cancer Brother   . Heart attack Brother     Medications- reviewed and updated Current Outpatient Medications  Medication Sig Dispense Refill  . acyclovir (ZOVIRAX) 400 MG tablet     . amoxicillin (AMOXIL) 500 MG capsule TAKE FOUR CAPSULES BY MOUTH ONE HOUR BEFORE APPOINTMENT    . aspirin EC 81 MG tablet Take 81 mg by mouth daily.    . Calcium Carbonate-Vit D-Min (CALCIUM 600+D3 PLUS MINERALS PO) Take 1 tablet by mouth at bedtime.    . cholecalciferol (VITAMIN D) 1000 units tablet Take 1,000 Units by mouth at bedtime.    Marland Kitchen escitalopram (LEXAPRO) 20 MG tablet     . Ibuprofen-diphenhydrAMINE Cit (ADVIL PM PO) Take by mouth.    . levothyroxine (SYNTHROID, LEVOTHROID) 112 MCG tablet Take 112 mcg by mouth at bedtime.     Marland Kitchen PENNSAID 2 % SOLN Apply 1 application topically 2 (two) times daily as needed. To affected areas for pain.  0  . rosuvastatin (CRESTOR) 20 MG tablet Take 20 mg by mouth at bedtime.      No current facility-administered medications for this visit.     Allergies-reviewed and updated Allergies  Allergen Reactions  . No Known Allergies     Social History   Socioeconomic History  . Marital status: Married    Spouse name: Not on file  . Number of children: Not on file  . Years of education: Not on file  . Highest education level: Not on file  Occupational History  . Not on file  Social Needs  . Financial resource strain: Not on file  . Food insecurity    Worry: Not on file     Inability: Not on file  . Transportation needs    Medical: Not on file    Non-medical: Not on file  Tobacco Use  . Smoking status: Never Smoker  . Smokeless tobacco: Never Used  Substance and Sexual Activity  . Alcohol use: Yes    Comment: occasionally  . Drug use: No  . Sexual activity: Not on file  Lifestyle  . Physical activity    Days per week: Not on file    Minutes per session: Not on file  . Stress: Not on file  Relationships  . Social connections  Talks on phone: Not on file    Gets together: Not on file    Attends religious service: Not on file    Active member of club or organization: Not on file    Attends meetings of clubs or organizations: Not on file    Relationship status: Not on file  Other Topics Concern  . Not on file  Social History Narrative  . Not on file          Objective:  Physical Exam: BP 120/77   Pulse 71   Temp 98.2 F (36.8 C)   Ht 5' 6.5" (1.689 m)   Wt 183 lb 6.1 oz (83.2 kg)   SpO2 99%   BMI 29.15 kg/m   Gen: NAD, resting comfortably CV: Regular rate and rhythm with no murmurs appreciated Pulm: Normal work of breathing, clear to auscultation bilaterally with no crackles, wheezes, or rhonchi GI: Normal bowel sounds present. Soft, Nontender, Nondistended. MSK: No edema, cyanosis, or clubbing noted Skin: Warm, dry Neuro: Grossly normal, moves all extremities Psych: Normal affect and thought content      Jannelle Notaro M. [name], MD 08/02/2019 2:54 PM

## 2019-08-02 NOTE — Assessment & Plan Note (Signed)
Continue levothyroxine 112 mcg daily.  Check TSH with next blood draw. 

## 2019-08-02 NOTE — Assessment & Plan Note (Signed)
Obtain records from previous PCP.  Continue Crestor 20 mg daily.  Check lipid panel with next draw.

## 2019-08-02 NOTE — Assessment & Plan Note (Signed)
Continue management per orthopedics.  Recommended that she can use Pennsaid as needed at various sites of her body including her feet, toes, and neck.

## 2019-08-02 NOTE — Assessment & Plan Note (Signed)
Recommended starting ferrous sulfate 325 mg every other day.  Will avoid prescription medications for the time being.

## 2019-08-02 NOTE — Assessment & Plan Note (Signed)
Stable.  Continue Lexapro 20mg daily

## 2019-08-02 NOTE — Assessment & Plan Note (Signed)
No current outbreak.  Continue acyclovir as needed.

## 2019-08-02 NOTE — Patient Instructions (Signed)
It was very nice to see you today!  I think your diarrhea is probably due to your gallbladder been taking.  Please make sure you are getting plenty of fiber in your diet and aim for at least 30 g of fiber per day.  It is okay for you to take Imodium as needed.  We will start taking ferrous sulfate 325 mg every other day to help with your restless legs.  You can use Pennsaid for your neck/shoulder as well as your toe.  No other changes today.  Come back to see me in 6 months for your next physical with blood work, or sooner as needed.  Take care, Dr [name]  Please try these tips to maintain a healthy lifestyle:   Eat at least 3 REAL meals and 1-2 snacks per day.  Aim for no more than 5 hours between eating.  If you eat breakfast, please do so within one hour of getting up.    Obtain twice as many fruits/vegetables as protein or carbohydrate foods for both lunch and dinner. (Half of each meal should be fruits/vegetables, one quarter protein, and one quarter starchy carbs)   Cut down on sweet beverages. This includes juice, soda, and sweet tea.    Exercise at least 150 minutes every week.

## 2019-09-14 ENCOUNTER — Other Ambulatory Visit: Payer: Self-pay | Admitting: Family Medicine

## 2019-09-14 MED ORDER — ESCITALOPRAM OXALATE 20 MG PO TABS: 20.0000 mg | ORAL_TABLET | Freq: Every day | ORAL | 0 refills | Status: DC

## 2019-09-14 MED ORDER — LEVOTHYROXINE SODIUM 112 MCG PO TABS: 112.0000 ug | ORAL_TABLET | Freq: Every day | ORAL | 0 refills | Status: DC

## 2019-09-14 MED ORDER — ROSUVASTATIN CALCIUM 20 MG PO TABS: 20.0000 mg | ORAL_TABLET | Freq: Every day | ORAL | 0 refills | Status: DC

## 2019-09-14 NOTE — Telephone Encounter (Signed)
Medication Refill - Medication: levothyroxine (SYNTHROID, LEVOTHROID) 112 MCG tablet rosuvastatin (CRESTOR) 20 MG tablet escitalopram (LEXAPRO) 20 MG tablet  Has the patient contacted their pharmacy? Yes - pharmacy states that they haven't heard back from [place].  Pt wants a a 79 day supply (Agent: If no, request that the patient contact the pharmacy for the refill.) (Agent: If yes, when and what did the pharmacy advise?)  Preferred Pharmacy (with phone number or street name):  [facility], [place] to [facility] [phone] (Phone) [phone] (Fax)   Agent: Please be advised that RX refills may take up to 3 business days. We ask that you follow-up with your pharmacy.

## 2019-09-14 NOTE — Telephone Encounter (Signed)
Requested medication (s) are due for refill today: yes  Requested medication (s) are on the active medication list: yes   Future visit scheduled: yes  Notes to clinic: review for refill   Requested Prescriptions  Pending Prescriptions Disp Refills   levothyroxine (SYNTHROID) 112 MCG tablet       Sig: Take 1 tablet (112 mcg total) by mouth at bedtime.     Endocrinology:  Hypothyroid Agents Failed - 09/14/2019  9:20 AM      Failed - TSH needs to be rechecked within 3 months after an abnormal result. Refill until TSH is due.      Failed - TSH in normal range and within 360 days    No results found for: TSH       Passed - Valid encounter within last 12 months    Recent Outpatient Visits          1 month ago Major depressive disorder, single episode, in remission [facility])   [facility], Algis Greenhouse, MD      Future Appointments            In 4 months [name], Algis Greenhouse, MD [place], [facility]            escitalopram (LEXAPRO) 20 MG tablet        Psychiatry:  Antidepressants - SSRI Passed - 09/14/2019  9:20 AM      Passed - Valid encounter within last 6 months    Recent Outpatient Visits          1 month ago Major depressive disorder, single episode, in remission [facility])   [facility], Algis Greenhouse, MD      Future Appointments            In 4 months [name], Algis Greenhouse, MD [place], [place] - Completed PHQ-2 or PHQ-9 in the last 360 days.       rosuvastatin (CRESTOR) 20 MG tablet       Sig: Take 1 tablet (20 mg total) by mouth at bedtime.     Cardiovascular:  Antilipid - Statins Failed - 09/14/2019  9:20 AM      Failed - Total Cholesterol in normal range and within 360 days    No results found for: CHOL, POCCHOL       Failed - LDL in normal range and within 360 days    No results found for: LDLCALC, LDLC, HIRISKLDL       Failed - HDL in normal range and  within 360 days    No results found for: HDL       Failed - Triglycerides in normal range and within 360 days    No results found for: TRIG       Passed - Patient is not pregnant      Passed - Valid encounter within last 12 months    Recent Outpatient Visits          1 month ago Major depressive disorder, single episode, in remission [facility])   [facility], Algis Greenhouse, MD      Future Appointments            In 4 months [name], Algis Greenhouse, MD [place], [facility]

## 2019-09-14 NOTE — Telephone Encounter (Signed)
See note

## 2019-10-09 ENCOUNTER — Encounter: Payer: Self-pay | Admitting: Family Medicine

## 2019-11-13 ENCOUNTER — Ambulatory Visit (INDEPENDENT_AMBULATORY_CARE_PROVIDER_SITE_OTHER): Payer: Medicare Other | Admitting: Family Medicine

## 2019-11-13 ENCOUNTER — Encounter: Payer: Self-pay | Admitting: Family Medicine

## 2019-11-13 ENCOUNTER — Other Ambulatory Visit: Payer: Self-pay

## 2019-11-13 VITALS — BP 131/85 | HR 89 | Temp 98.1°F | Ht 66.5 in | Wt 183.4 lb

## 2019-11-13 DIAGNOSIS — R030 Elevated blood-pressure reading, without diagnosis of hypertension: Secondary | ICD-10-CM | POA: Diagnosis not present

## 2019-11-13 DIAGNOSIS — E039 Hypothyroidism, unspecified: Secondary | ICD-10-CM | POA: Diagnosis not present

## 2019-11-13 DIAGNOSIS — E785 Hyperlipidemia, unspecified: Secondary | ICD-10-CM

## 2019-11-13 DIAGNOSIS — R519 Headache, unspecified: Secondary | ICD-10-CM

## 2019-11-13 LAB — COMPREHENSIVE METABOLIC PANEL
ALT: 22 U/L (ref 0–35)
AST: 20 U/L (ref 0–37)
Albumin: 4.6 g/dL (ref 3.5–5.2)
Alkaline Phosphatase: 41 U/L (ref 39–117)
BUN: 10 mg/dL (ref 6–23)
CO2: 27 mEq/L (ref 19–32)
Calcium: 9.5 mg/dL (ref 8.4–10.5)
Chloride: 104 mEq/L (ref 96–112)
Creatinine, Ser: 0.68 mg/dL (ref 0.40–1.20)
GFR: 86.69 mL/min (ref >60.00)
Glucose, Bld: 146 mg/dL — ABNORMAL HIGH (ref 70–99)
Potassium: 3.9 mEq/L (ref 3.5–5.1)
Sodium: 140 mEq/L (ref 135–145)
Total Bilirubin: 0.4 mg/dL (ref 0.2–1.2)
Total Protein: 7.2 g/dL (ref 6.0–8.3)

## 2019-11-13 LAB — CBC
HCT: 41.8 % (ref 36.0–46.0)
Hemoglobin: 14.2 g/dL (ref 12.0–15.0)
MCHC: 34.1 g/dL (ref 30.0–36.0)
MCV: 90.6 fl (ref 78.0–100.0)
Platelets: 224 K/uL (ref 150.0–400.0)
RBC: 4.61 Mil/uL (ref 3.87–5.11)
RDW: 13.2 % (ref 11.5–15.5)
WBC: 5.2 K/uL (ref 4.0–10.5)

## 2019-11-13 LAB — LIPID PANEL
Cholesterol: 208 mg/dL — ABNORMAL HIGH (ref 0–200)
HDL: 45.2 mg/dL (ref >39.00)
Total CHOL/HDL Ratio: 5
Triglycerides: 525 mg/dL — ABNORMAL HIGH (ref 0.0–149.0)

## 2019-11-13 LAB — LDL CHOLESTEROL, DIRECT: Direct LDL: 97 mg/dL

## 2019-11-13 LAB — TSH: TSH: 1.92 u[IU]/mL (ref 0.35–4.50)

## 2019-11-13 MED ORDER — KETOROLAC TROMETHAMINE 60 MG/2ML IM SOLN
60.0000 mg | Freq: Once | INTRAMUSCULAR | Status: AC
  Administered 2019-11-13: 14:00:00 60 mg via INTRAMUSCULAR

## 2019-11-13 MED ORDER — PREDNISONE 50 MG PO TABS: ORAL_TABLET | ORAL | 0 refills | Status: DC

## 2019-11-13 NOTE — Patient Instructions (Addendum)
It was very nice to see you today!  We will give you an injection of toradol.  Please start the prednisone today.   WE will check blood work to make sure your thyroid level and everything else is ok.  Let me know if not improving in the next few days.   Take care, Dr [name]  Please try these tips to maintain a healthy lifestyle:   Eat at least 3 REAL meals and 1-2 snacks per day.  Aim for no more than 5 hours between eating.  If you eat breakfast, please do so within one hour of getting up.    Obtain twice as many fruits/vegetables as protein or carbohydrate foods for both lunch and dinner. (Half of each meal should be fruits/vegetables, one quarter protein, and one quarter starchy carbs)   Cut down on sweet beverages. This includes juice, soda, and sweet tea.    Exercise at least 150 minutes every week.

## 2019-11-13 NOTE — Progress Notes (Signed)
   Chief Complaint:  Martha Orr is a 63 y.o. female who presents today with a chief complaint of headache.   Assessment/Plan:  Headache She has some signs of sinusitis.  She has no red flags and her neurologic exam is normal.  Start oral prednisone.  Will give 60 mg of IM Toradol today.  Encouraged good oral hydration.  Will check CBC, C met, TSH.  Discussed reasons to return to care.  Follow-up as needed.  Elevated blood pressure readings At goal today.  Likely elevated due to her headache.  Will continue home monitoring with goal 140/90 or lower.  Dyslipidemia Check lipid panel today.  Continue Crestor 20 mg daily.  Hypothyroidism Continue levothyroxine 112 mcg daily.  Check TSH today.    Subjective:  HPI:  Headache Started couple weeks ago.  Located in bilateral forehead.  No weakness or numbness.  No nausea or vomiting.  No vision changes.  Has been intermittent in nature over the last couple of weeks.  She is also had some associated feeling like her ears are stopped up and sinus congestion.  She is concerned she may have a sinus infection.  No fevers.  She has had more fatigue and decreased energy level.  She is also noticed having some increased stress at home.  She is also noticed elevated blood pressure readings into the 150s over 80s over the last couple of weeks.  This does not occur every time she checks her blood pressure but only infrequently.  ROS: Per HPI  PMH: She reports that she has never smoked. She has never used smokeless tobacco. She reports current alcohol use. She reports that she does not use drugs.      Objective:  Physical Exam: BP 131/85   Pulse 89   Temp 98.1 F (36.7 C)   Ht 5' 6.5" (1.689 m)   Wt 183 lb 6.1 oz (83.2 kg)   SpO2 96%   BMI 29.15 kg/m   Gen: NAD, resting comfortably HEENT: Bilateral TMs with clear effusion.  Oropharynx erythematous.  Nasal mucosa erythematous with clear effusion. CV: Regular rate and rhythm with no murmurs  appreciated Pulm: Normal work of breathing, clear to auscultation bilaterally with no crackles, wheezes, or rhonchi Neuro: Cranial nerves III through XII intact.  Finger-nose-finger testing intact bilaterally.  Strength 5 out of 5 in upper and lower extremities.      Algis Greenhouse. [name], MD 11/13/2019 1:48 PM

## 2019-11-15 NOTE — Progress Notes (Signed)
Please inform patient of the following:  Blood sugar and triglycerides were a bit high, but she was not fasting so these are within acceptable ranges.  The rest of her blood work was NORMAL. Would like for her to let us know if her headache has not improved.  Martha Orr. [name], MD 11/15/2019 1:24 PM

## 2019-11-16 ENCOUNTER — Telehealth: Payer: Self-pay | Admitting: Family Medicine

## 2019-11-16 NOTE — Telephone Encounter (Signed)
Please advise 

## 2019-11-16 NOTE — Telephone Encounter (Signed)
Can send in diclofenac 75mg  bid. Would like for her to let us know if not improving by Monday.

## 2019-11-16 NOTE — Telephone Encounter (Signed)
See note  Copied from [place] [id number]. Topic: General - Other >> Nov 16, 2019  3:49 PM Yvette Rack wrote: Reason for CRM: Pt stated the medication that was prescribed in not really helping so she requests that another medication be sent to [place], [place]

## 2019-11-19 ENCOUNTER — Other Ambulatory Visit: Payer: Self-pay

## 2019-11-19 MED ORDER — DICLOFENAC SODIUM 75 MG PO TBEC: 75.0000 mg | DELAYED_RELEASE_TABLET | Freq: Two times a day (BID) | ORAL | 0 refills | Status: DC

## 2019-11-19 NOTE — Telephone Encounter (Signed)
Notified patient via voicemail,informed to call if any questions

## 2019-11-25 ENCOUNTER — Other Ambulatory Visit: Payer: Self-pay | Admitting: Family Medicine

## 2020-01-17 ENCOUNTER — Telehealth: Payer: Self-pay

## 2020-01-17 NOTE — Telephone Encounter (Signed)
Patient requesting medication for possible ear infection. Informed patient that I would need to schedule her appt. Patient declined offer. Patient would like to speak with Dr. [name] regarding this matter.

## 2020-01-17 NOTE — Telephone Encounter (Signed)
Patient declined appt, patient is having sinus issues and ears are plugged.

## 2020-01-17 NOTE — Telephone Encounter (Signed)
Please ask pt to schedule OV - need to take a look in her ear to make sure has the right medication.  Algis Greenhouse. [name], MD 01/17/2020 12:37 PM

## 2020-01-17 NOTE — Telephone Encounter (Signed)
Please advise 

## 2020-02-06 ENCOUNTER — Other Ambulatory Visit: Payer: Self-pay

## 2020-02-06 ENCOUNTER — Ambulatory Visit (INDEPENDENT_AMBULATORY_CARE_PROVIDER_SITE_OTHER): Payer: Medicare Other | Admitting: Family Medicine

## 2020-02-06 ENCOUNTER — Encounter: Payer: Self-pay | Admitting: Family Medicine

## 2020-02-06 VITALS — BP 122/78 | HR 71 | Temp 98.1°F | Ht 66.5 in | Wt 183.0 lb

## 2020-02-06 DIAGNOSIS — F325 Major depressive disorder, single episode, in full remission: Secondary | ICD-10-CM | POA: Diagnosis not present

## 2020-02-06 DIAGNOSIS — F419 Anxiety disorder, unspecified: Secondary | ICD-10-CM | POA: Diagnosis not present

## 2020-02-06 DIAGNOSIS — R002 Palpitations: Secondary | ICD-10-CM | POA: Diagnosis not present

## 2020-02-06 DIAGNOSIS — H6980 Other specified disorders of Eustachian tube, unspecified ear: Secondary | ICD-10-CM

## 2020-02-06 DIAGNOSIS — E039 Hypothyroidism, unspecified: Secondary | ICD-10-CM | POA: Diagnosis not present

## 2020-02-06 MED ORDER — AZELASTINE HCL 0.1 % NA SOLN: 2.0000 | Freq: Two times a day (BID) | NASAL | 12 refills | Status: DC

## 2020-02-06 NOTE — Progress Notes (Signed)
   Martha Orr is a 64 y.o. female who presents today for an office visit.  Assessment/Plan:  New/Acute Problems: Palpitations Unclear if fitbit readings are accurate. Normal rhythm today with normal cardiac exam. Given her symptoms of palpitations, we will order Holter monitoring to rule out any other underlying arrhythmias or abnormalities.  Discussed reasons to return to care and seek emergent care.  Eustachian tube dysfunction No red flags.  Will start Astelin.  Continue Allegra.  Chronic Problems Addressed Today: Hypothyroidism Last TSH at goal.  Continue levothyroxine 112 mcg daily.  Anxiety Stable.  Continue Lexapro 20 mg daily.  Major depressive disorder, single episode, in remission ([place]) Stable.  Continue Lexapro 20 mg daily.     Subjective:  HPI:  Patient is concerned about fluctuating heart rate.  This has been happening over the last 4 weeks.  Will occasionally have palpitations.  Occasionally has associated shortness of breath.  No dizziness or presyncope.  No chest pain.  No obvious aggravating or precipitating factors.  She has been wearing a fit bit which has been showing fluctuating heart rate readings from the 70s to the 140s.  No current symptoms.  She has also had some nasal congestion and ear pressure.  She has tried taking Allegra which is helped modestly.  She has not tried any nasal sprays.  She is otherwise doing well.       Objective:  Physical Exam: BP 122/78   Pulse 71   Temp 98.1 F (36.7 C)   Ht 5' 6.5" (1.689 m)   Wt 183 lb (83 kg)   SpO2 97%   BMI 29.09 kg/m   Gen: No acute distress, resting comfortably HEENT: Bilateral TMs retracted with clear effusion.  Nose mucosa boggy with clear discharge. CV: Regular rate and rhythm with no murmurs appreciated Pulm: Normal work of breathing, clear to auscultation bilaterally with no crackles, wheezes, or rhonchi Neuro: Grossly normal, moves all extremities Psych: Normal affect and thought  content      Jaremy Nosal M. [name], MD 02/06/2020 10:15 AM

## 2020-02-06 NOTE — Patient Instructions (Signed)
It was very nice to see you today!  We will set you up to have a heart monitor.  You should be receiving a call in the next week or so to get this scheduled.  Please try the nasal spray.  No other changes today.  Please come back to see me in 1 year for your annual physical with blood work, or sooner if needed.  Take care, Dr [name]  Please try these tips to maintain a healthy lifestyle:   Eat at least 3 REAL meals and 1-2 snacks per day.  Aim for no more than 5 hours between eating.  If you eat breakfast, please do so within one hour of getting up.    Each meal should contain half fruits/vegetables, one quarter protein, and one quarter carbs (no bigger than a computer mouse)   Cut down on sweet beverages. This includes juice, soda, and sweet tea.     Drink at least 1 glass of water with each meal and aim for at least 8 glasses per day   Exercise at least 150 minutes every week.

## 2020-02-06 NOTE — Assessment & Plan Note (Signed)
Last TSH at goal.  Continue levothyroxine 112 mcg daily.

## 2020-02-06 NOTE — Assessment & Plan Note (Signed)
Stable.  Continue Lexapro 20mg daily

## 2020-02-07 ENCOUNTER — Telehealth: Payer: Self-pay | Admitting: *Deleted

## 2020-02-07 NOTE — Telephone Encounter (Signed)
Patient enrolled for [employer] to mail a 3 day ZIO XT long term holter monitor to the patients home.

## 2020-02-16 ENCOUNTER — Other Ambulatory Visit: Payer: Self-pay | Admitting: Family Medicine

## 2020-02-19 ENCOUNTER — Telehealth: Payer: Self-pay | Admitting: Family Medicine

## 2020-02-19 NOTE — Telephone Encounter (Signed)
Patient is calling in this afternoon saying she has not received her Holter monitor and asked if Dr.Parker could check up on it for her.

## 2020-02-21 NOTE — Telephone Encounter (Signed)
Received a call back from [employer].  Patients monitor was held up at the [place] due to weather.  Monitor is scheduled to be delivered by 8 pm today.  2nd enrollment to be cancelled by [employer].

## 2020-02-21 NOTE — Telephone Encounter (Signed)
Patient never received monitor.  Meredith from [employer] called to cancel original enrollment from 02/06/20.  Patient enrolled today for 2nd monitor to be mailed.   Asked for monitor mailing to be expedited.

## 2020-02-21 NOTE — Telephone Encounter (Signed)
Per prior telephone note Martha Orr      02/07/20 9:22 AM Note   Patient enrolled for [employer] to mail a 3 day ZIO XT long term holter monitor to the patients home.     Hi Shelly,  Will you please contact pt to follow-up regarding ZIO? Thanks!

## 2020-02-21 NOTE — Telephone Encounter (Signed)
Patient calling back to check on the status of the heart monitor. Patient would like a call back to discuss getting a referral being placed also.

## 2020-02-22 ENCOUNTER — Other Ambulatory Visit (INDEPENDENT_AMBULATORY_CARE_PROVIDER_SITE_OTHER): Payer: Medicare Other

## 2020-02-22 DIAGNOSIS — R002 Palpitations: Secondary | ICD-10-CM | POA: Diagnosis not present

## 2020-02-25 ENCOUNTER — Other Ambulatory Visit: Payer: Self-pay

## 2020-02-25 MED ORDER — ESCITALOPRAM OXALATE 20 MG PO TABS: 20.0000 mg | ORAL_TABLET | Freq: Every day | ORAL | 0 refills | Status: DC

## 2020-02-25 MED ORDER — ROSUVASTATIN CALCIUM 20 MG PO TABS: 20.0000 mg | ORAL_TABLET | Freq: Every day | ORAL | 0 refills | Status: DC

## 2020-03-10 ENCOUNTER — Other Ambulatory Visit: Payer: Self-pay | Admitting: *Deleted

## 2020-03-10 ENCOUNTER — Telehealth: Payer: Self-pay | Admitting: Family Medicine

## 2020-03-10 DIAGNOSIS — R002 Palpitations: Secondary | ICD-10-CM

## 2020-03-10 NOTE — Progress Notes (Signed)
Please inform patient of the following:  She has had a few short duration arrhythmias that could explain her symptoms. I don't think this is anything major but recommend that we place a referral to cardiology to make sure nothing else needs to be done.  Algis Greenhouse. [name], MD 03/10/2020 2:16 PM

## 2020-03-10 NOTE — Telephone Encounter (Signed)
Spouse states he had requested a couple weeks ago for patient to be referred to Dr. Irish Lack.  States has not received a call from Cardiology.  Patient also turned in heart monitor 2 weeks ago and has not received results of that test back yet.  Spouse is requesting a call back today if at all possible.

## 2020-03-11 NOTE — Telephone Encounter (Signed)
Patient was notified and voices understanding.  

## 2020-03-11 NOTE — Telephone Encounter (Signed)
Results given to patient, cardiology referral placed. Patient notified . See long term monitor note

## 2020-03-19 ENCOUNTER — Other Ambulatory Visit: Payer: Self-pay | Admitting: Family Medicine

## 2020-03-19 MED ORDER — AMOXICILLIN-POT CLAVULANATE 875-125 MG PO TABS: 1.0000 | ORAL_TABLET | Freq: Two times a day (BID) | ORAL | 0 refills | Status: DC

## 2020-04-02 ENCOUNTER — Telehealth: Payer: Self-pay | Admitting: Family Medicine

## 2020-04-02 NOTE — Telephone Encounter (Signed)
Patient called in wanting to know if Dr.Parker could refer her out to an ENT without having to schedule an appointment,

## 2020-04-03 ENCOUNTER — Other Ambulatory Visit: Payer: Self-pay | Admitting: *Deleted

## 2020-04-03 DIAGNOSIS — R0981 Nasal congestion: Secondary | ICD-10-CM

## 2020-04-03 NOTE — Telephone Encounter (Signed)
Pt stated need a referral To ENT due to Sx of sinus infection, running nose. HA

## 2020-04-03 NOTE — Telephone Encounter (Signed)
Ok with me. Please place any necessary orders. 

## 2020-04-03 NOTE — Telephone Encounter (Signed)
ENT Referral placed

## 2020-04-07 ENCOUNTER — Telehealth: Payer: Self-pay | Admitting: Family Medicine

## 2020-04-07 NOTE — Telephone Encounter (Signed)
Noted. Agree with OV ASAP.  Algis Greenhouse. [name], MD 04/07/2020 3:31 PM

## 2020-04-07 NOTE — Telephone Encounter (Signed)
Pt husband stated has cardiology apt tomorrow and will give [place] a call to schedule a virtual appointment  Patient has little energy and sleeping to much

## 2020-04-07 NOTE — Telephone Encounter (Signed)
Appointment schedule, pt aware

## 2020-04-07 NOTE — Telephone Encounter (Signed)
Patients husband called stating the patient was still not feeling well and she has little to no energy. Patient husband wanted to see if patient could be seen in office. I tried to set up a virtual but they would like if the CMA could call back and discuss what can be done over virtual.  Please advise.

## 2020-04-08 ENCOUNTER — Other Ambulatory Visit: Payer: Self-pay

## 2020-04-08 ENCOUNTER — Ambulatory Visit: Payer: Medicare Other | Admitting: Family Medicine

## 2020-04-08 ENCOUNTER — Encounter: Payer: Self-pay | Admitting: Interventional Cardiology

## 2020-04-08 ENCOUNTER — Ambulatory Visit: Payer: Medicare Other | Admitting: Interventional Cardiology

## 2020-04-08 VITALS — BP 114/80 | HR 74 | Ht 66.5 in | Wt 176.8 lb

## 2020-04-08 DIAGNOSIS — R0602 Shortness of breath: Secondary | ICD-10-CM | POA: Diagnosis not present

## 2020-04-08 DIAGNOSIS — R002 Palpitations: Secondary | ICD-10-CM | POA: Diagnosis not present

## 2020-04-08 DIAGNOSIS — I491 Atrial premature depolarization: Secondary | ICD-10-CM

## 2020-04-08 DIAGNOSIS — R072 Precordial pain: Secondary | ICD-10-CM | POA: Diagnosis not present

## 2020-04-08 DIAGNOSIS — E782 Mixed hyperlipidemia: Secondary | ICD-10-CM | POA: Diagnosis not present

## 2020-04-08 MED ORDER — METOPROLOL TARTRATE 100 MG PO TABS: ORAL_TABLET | ORAL | 0 refills | Status: DC

## 2020-04-08 NOTE — Progress Notes (Addendum)
Cardiology Office Note   Date:  04/08/2020   ID:  Martha Orr 03/11/1956, MRN [id number]  PCP:  Martha Barrack, MD    No chief complaint on file.  palpitations  Wt Readings from Last 3 Encounters:  04/08/20 176 lb 12.8 oz (80.2 kg)  02/06/20 183 lb (83 kg)  11/13/19 183 lb 6.1 oz (83.2 kg)       History of Present Illness: Martha Orr is a 64 y.o. female who is being seen today for the evaluation of fatigue, chest pain and palpitations at the request of Martha Barrack, MD.  She has had decreased stamina for months.  Difficult to walk distances.  Has DOE.   She had some chest pain that woke her from sleep a few weeks ago.  Mom had MI in the past so it was concerning.  She eventually went back to sleep.  No CP since that time.  She has had some indigestion relieve with TUMS.   Palpitations have been present for several months.  SHe has sx nearly every day.  She has a watch that monitors her HR.  Watch shows some spikes in her HR to 120s while at rest.   Many years ago, she had a stress test.  No CP since the episode several weeks ago, even with exertion.    Past Medical History:  Diagnosis Date  . Arthritis   . Chronic back pain    buldging disc  . Depression    takes Lexapro daily  . Family history of adverse reaction to anesthesia    sister gets sick after anesthesia  . High cholesterol    takes Crestor daily  . History of colon polyps    benign  . History of shingles   . Hypothyroidism    takes Synthroid daily  . Joint pain   . Joint swelling   . Microscopic hematuria    states her entire life and family is the same way    Past Surgical History:  Procedure Laterality Date  . ABDOMINAL HYSTERECTOMY  1998  . APPENDECTOMY    . BREAST BIOPSY    . BUNIONECTOMY Bilateral   . CARPAL TUNNEL RELEASE Bilateral   . CHOLECYSTECTOMY    . COLONOSCOPY    . KNEE ARTHROSCOPY Right   . KNEE ARTHROSCOPY Left 11/01/2017   Procedure: ARTHROSCOPY LEFT  KNEE;  Surgeon: Melrose Nakayama, MD;  Location: [facility];  Service: Orthopedics;  Laterality: Left;  . knot removed from right breast    . NASAL SINUS SURGERY    . TONSILLECTOMY    . TONSILLECTOMY AND ADENOIDECTOMY  2002  . TOTAL KNEE ARTHROPLASTY Bilateral 10/12/2016   Procedure: TOTAL KNEE BILATERAL;  Surgeon: Melrose Nakayama, MD;  Location: [facility];  Service: Orthopedics;  Laterality: Bilateral;     Current Outpatient Medications  Medication Sig Dispense Refill  . acyclovir (ZOVIRAX) 400 MG tablet     . amoxicillin (AMOXIL) 500 MG capsule TAKE FOUR CAPSULES BY MOUTH ONE HOUR BEFORE APPOINTMENT    . amoxicillin-clavulanate (AUGMENTIN) 875-125 MG tablet Take 1 tablet by mouth 2 (two) times daily. 14 tablet 0  . aspirin EC 81 MG tablet Take 81 mg by mouth daily.    Marland Kitchen azelastine (ASTELIN) 0.1 % nasal spray Place 2 sprays into both nostrils 2 (two) times daily. 30 mL 12  . Calcium Carbonate-Vit D-Min (CALCIUM 600+D3 PLUS MINERALS PO) Take 1 tablet by mouth at bedtime.    . cholecalciferol (VITAMIN  D) 1000 units tablet Take 1,000 Units by mouth at bedtime.    Marland Kitchen escitalopram (LEXAPRO) 20 MG tablet Take 1 tablet (20 mg total) by mouth daily. 90 tablet 0  . Ginkgo Biloba 40 MG TABS Take by mouth.    . methylPREDNISolone (MEDROL DOSEPAK) 4 MG TBPK tablet TAKE BY MOUTH AS DIRECTED ON INSIDE OF PACKAGE    . PENNSAID 2 % SOLN Apply 1 application topically 2 (two) times daily as needed. To affected areas for pain.  0  . rosuvastatin (CRESTOR) 20 MG tablet Take 1 tablet (20 mg total) by mouth at bedtime. 90 tablet 0  . SYNTHROID 112 MCG tablet TAKE 1 TABLET AT BEDTIME. 90 tablet 0   No current facility-administered medications for this visit.    Allergies:   No known allergies    Social History:  The patient  reports that she has never smoked. She has never used smokeless tobacco. She reports current alcohol use. She reports that she does not use drugs.   Family History:  The patient's family history  includes Arthritis in her father, sister, sister, and sister; Cancer in her brother and brother; Depression in her brother; Heart attack in her brother, brother, mother, and sister; Heart disease in her brother; Hyperlipidemia in her brother, mother, and sister; Hypertension in her brother; Stroke in her sister.    ROS:  Please see the history of present illness.   Otherwise, review of systems are positive for fatigue, palpitations.   All other systems are reviewed and negative.    PHYSICAL EXAM: VS:  BP 114/80   Pulse 74   Ht 5' 6.5" (1.689 m)   Wt 176 lb 12.8 oz (80.2 kg)   SpO2 95%   BMI 28.11 kg/m  , BMI Body mass index is 28.11 kg/m. GEN: Well nourished, well developed, in no acute distress  HEENT: normal  Neck: no JVD, carotid bruits, or masses Cardiac: RRR; no murmurs, rubs, or gallops,no edema  Respiratory:  clear to auscultation bilaterally, normal work of breathing GI: soft, nontender, nondistended, + BS MS: no deformity or atrophy  Skin: warm and dry, no rash Neuro:  Strength and sensation are intact Psych: euthymic mood, full affect   EKG:   The ekg ordered today demonstrates NSR, diffuse ST changes, no significant change compared to prior   Recent Labs: 11/13/2019: ALT 22; BUN 10; Creatinine, Ser 0.68; Hemoglobin 14.2; Platelets 224.0; Potassium 3.9; Sodium 140; TSH 1.92   Lipid Panel    Component Value Date/Time   CHOL 208 (H) 11/13/2019 1334   TRIG (H) 11/13/2019 1334    525.0 Triglyceride is over 400; calculations on Lipids are invalid.   HDL 45.20 11/13/2019 1334   CHOLHDL 5 11/13/2019 1334   LDLDIRECT 97.0 11/13/2019 1334     Other studies Reviewed: Additional studies/ records that were reviewed today with results demonstrating: PMD labs reviewed.   ASSESSMENT AND PLAN:  1. Chest pain: Check CTA coronaries.  We discussed cath in the event the CTA is abnormal. Metoprolol 100 mg dose before CTA. 2. Palpitations: PACs noted on monitor.    3. Hyperlpidemia: Contiue rosuvastatin. 4. DOE/fatigue: will check echo.    Current medicines are reviewed at length with the patient today.  The patient concerns regarding her medicines were addressed.  The following changes have been made:  No change  Labs/ tests ordered today include: as above No orders of the defined types were placed in this encounter.   Recommend 150 minutes/week of aerobic  exercise Low fat, low carb, high fiber diet recommended  Disposition:   FU in based on test results   Signed, Larae Grooms, MD  04/08/2020 11:15 AM    [address], [place], [place]  [address] Phone: [phone]; Fax: [phone]

## 2020-04-08 NOTE — Patient Instructions (Addendum)
Medication Instructions:  Your physician recommends that you continue on your current medications as directed. Please refer to the Current Medication list given to you today.  *If you need a refill on your cardiac medications before your next appointment, please call your pharmacy*   Lab Work: None today  If you have labs (blood work) drawn today and your tests are completely normal, you will receive your results only by: Marland Kitchen MyChart Message (if you have MyChart) OR . A paper copy in the mail If you have any lab test that is abnormal or we need to change your treatment, we will call you to review the results.   Testing/Procedures: Your physician has requested that you have an echocardiogram. Echocardiography is a painless test that uses sound waves to create images of your heart. It provides your doctor with information about the size and shape of your heart and how well your heart's chambers and valves are working. This procedure takes approximately one hour. There are no restrictions for this procedure.  Your physician has requested that you have cardiac CT. Cardiac computed tomography (CT) is a painless test that uses an x-ray machine to take clear, detailed pictures of your heart. For further information please visit [email or web address]tn. Please follow instruction sheet as given.   Follow-Up: Based on test results   Other Instructions Your cardiac CT will be scheduled at one of the below locations:   [facility] [address] [place], [place] [address] [phone]  [facility] [address] [place], [place] [address] [phone]  If scheduled at [facility], please arrive at the [facility] main entrance of [facility] 30 minutes prior to test start time. Proceed to the [facility] Radiology Department (first floor) to check-in and test prep.  If scheduled at [facility], please arrive 15 mins early for check-in and test prep.  Please follow these instructions carefully (unless otherwise directed):   On the Night Before the Test: . Be sure to Drink plenty of water. . Do not consume any caffeinated/decaffeinated beverages or chocolate 12 hours prior to your test. . Do not take any antihistamines 12 hours prior to your test.   On the Day of the Test: . Drink plenty of water. Do not drink any water within one hour of the test. . Do not eat any food 4 hours prior to the test. . You may take your regular medications prior to the test.  . Take metoprolol (Lopressor) 100 MG two hours prior to test. . FEMALES- please wear underwire-free bra if available      After the Test: . Drink plenty of water. . After receiving IV contrast, you may experience a mild flushed feeling. This is normal. . On occasion, you may experience a mild rash up to 24 hours after the test. This is not dangerous. If this occurs, you can take Benadryl 25 mg and increase your fluid intake. . If you experience trouble breathing, this can be serious. If it is severe call 911 IMMEDIATELY. If it is mild, please call our office.   Once we have confirmed authorization from your insurance company, we will call you to set up a date and time for your test.   For non-scheduling related questions, please contact the cardiac imaging nurse navigator should you have any questions/concerns: Marchia Bond, RN Navigator Cardiac Imaging [facility] [phone] office  For scheduling needs, including  cancellations and rescheduling, please call [phone].

## 2020-04-09 ENCOUNTER — Telehealth: Payer: Self-pay

## 2020-04-09 ENCOUNTER — Telehealth (HOSPITAL_COMMUNITY): Payer: Self-pay | Admitting: Emergency Medicine

## 2020-04-09 NOTE — Telephone Encounter (Signed)
Patient called to report that she found out this morning that she was around someone who tested positive for covid so she went and had a rapid test done this morning that came back positive. She states that she had another test done to confirm but is waiting on results for that test. She is asymptomatic and has not had the vaccine.

## 2020-04-09 NOTE — Telephone Encounter (Signed)
Reaching out to patient to offer assistance regarding upcoming cardiac imaging study; pt verbalizes understanding of appt date/time, parking situation and where to check in, pre-test NPO status and medications ordered, and verified current allergies; name and call back number provided for further questions should they arise Martha Bond RN Martha Orr [facility] [phone] office [phone] cell   Pt states she is still waiting on results of 2nd covid test.  I told her if test results positive for 2nd time, we will need to r/s her. If test results negative, we can proceed with test per usual.  Pt denies questions about instructions for CCTA. Martha Orr

## 2020-04-10 ENCOUNTER — Other Ambulatory Visit: Payer: Self-pay

## 2020-04-10 ENCOUNTER — Ambulatory Visit
Admission: RE | Admit: 2020-04-10 | Discharge: 2020-04-10 | Disposition: A | Discharge status: Home / Self Care | Payer: Medicare Other | Source: Ambulatory Visit | Attending: Interventional Cardiology | Admitting: Interventional Cardiology

## 2020-04-10 ENCOUNTER — Telehealth: Payer: Self-pay | Admitting: Interventional Cardiology

## 2020-04-10 ENCOUNTER — Telehealth: Payer: Self-pay | Admitting: Physician Assistant

## 2020-04-10 DIAGNOSIS — R072 Precordial pain: Secondary | ICD-10-CM | POA: Insufficient documentation

## 2020-04-10 LAB — POCT I-STAT CREATININE: Creatinine, Ser: 0.7 mg/dL (ref 0.44–1.00)

## 2020-04-10 MED ORDER — IOHEXOL 350 MG/ML SOLN
75.0000 mL | Freq: Once | INTRAVENOUS | Status: AC | PRN
  Administered 2020-04-10: 75 mL via INTRAVENOUS

## 2020-04-10 MED ORDER — NITROGLYCERIN 0.4 MG SL SUBL
0.8000 mg | SUBLINGUAL_TABLET | Freq: Once | SUBLINGUAL | Status: AC
  Administered 2020-04-10: 0.8 mg via SUBLINGUAL

## 2020-04-10 NOTE — Telephone Encounter (Addendum)
   Received a call from radiology regarding the CT results.  The CT shows possible Covid pneumonia, they were calling to let us know.  Reviewed the situation with Dr. Audie Box, unless patient is acutely ill, she does not need to come in tonight.  Spoke with the patient on the phone.  She has a cough, and general malaise, but is not acutely ill and is not acutely short of breath.  Strongly advised her that she needed to be evaluated by her PCP soon as possible tomorrow to determine what treatment might be needed.  She stated that the rapid test was positive but the other test was negative for Covid and wanted to know if she had Covid.  I told her that based on the CT results, I felt like she probably did.  She does not feel like she is acutely ill and feels like she is stable to wait until tomorrow to call her PCP.  She promises that she will do so.    CT CORONARY MORPH W/CTA COR W/SCORE W/CA W/CM &/OR WO/CM  Addendum Date: 04/10/2020   ADDENDUM REPORT: 04/10/2020 16:23 EXAM: OVER-READ INTERPRETATION  CT CHEST The following report is an over-read performed by radiologist Dr. Samara Snide [facility], [place] on 04/10/2020. This over-read does not include interpretation of cardiac or coronary anatomy or pathology. The coronary CTA interpretation by the cardiologist is attached. COMPARISON:  10/14/2016 chest radiograph. FINDINGS: Cardiovascular: Normal heart size. No significant pericardial effusion/thickening. Minimally atherosclerotic thoracic aorta with ectatic 4.3 cm ascending thoracic aorta. Normal caliber pulmonary arteries. No central pulmonary emboli. Mediastinum/Nodes: Unremarkable esophagus. No pathologically enlarged mediastinal or hilar lymph nodes. Lungs/Pleura: No pneumothorax. No pleural effusion. No acute consolidative airspace disease or lung masses. Two scattered small solid pulmonary nodules, largest 4 mm in the right middle lobe along the major fissure (series 11/image 15).  Patchy ground-glass opacities throughout the peripheral lungs bilaterally, most prominent in the lower lobes. Upper abdomen: No acute abnormality. Musculoskeletal: No aggressive appearing focal osseous lesions. Moderate thoracic spondylosis. IMPRESSION: 1. Patchy ground-glass opacities throughout the peripheral lungs bilaterally. Atypical pneumonia suspected, with findings typical of COVID-19. 2. Two small solid pulmonary nodules, largest 4 mm. No follow-up needed if patient is low-risk (and has no known or suspected primary neoplasm). Non-contrast chest CT can be considered in 12 months if patient is high-risk. This recommendation follows the consensus statement: Guidelines for Management of Incidental Pulmonary Nodules Detected on CT Images: From the Fleischner Society 2017; Radiology 2017; 284:228-243. 3. Ectatic 4.3 cm ascending thoracic aorta. Recommend annual imaging followup by CTA or MRA. This recommendation follows 2010 ACCF/AHA/AATS/ACR/ASA/SCA/SCAI/SIR/STS/SVM Guidelines for the Diagnosis and Management of Patients with Thoracic Aortic Disease. Circulation. 2010; [id number]. Aortic aneurysm NOS (ICD10-I71.9). These results will be called to the ordering clinician or representative by the Radiologist Assistant, and communication documented in the PACS or [employer]. Electronically Signed   By: Ilona Sorrel M.D.   On: 04/10/2020 16:23   Rosaria Ferries, PA-C 04/10/2020 8:06 PM

## 2020-04-10 NOTE — Telephone Encounter (Signed)
Called and spoke to Martha Orr in Radiology. He states that patient's Cardiac CT showed a calcium score of 30.5 and suspected Covid pneumonia. Dr. Irish Lack made aware.  This patient was just seen in the office on 4/13 when Cardiac CT was ordered. She called to report her positive COVID result on 4/14 pending second test. It appears the patient was scheduled for her Cardiac CT that same afternoon.   Martha Orr, [place] Navigator RN spoke to the patient regarding her first covid test being positive and let her know that if her second one came back positive we would have to cancel.  I called and spoke to Henry. Apparently the second covid test came back negative, so they proceeded with the Cardiac CT. The CT staff also wore the proper PPE.

## 2020-04-10 NOTE — Progress Notes (Signed)
Patient stated had a headache prior to arrival 6/10 throbbing. After CT headache increased 9/10 throbbing. Gave coffee and headache decreased to.8/10 throbbing. Ambulated to exit steady gait.

## 2020-04-10 NOTE — Telephone Encounter (Signed)
Tracy with [facility] is calling to report abnormal CT results. 

## 2020-04-11 ENCOUNTER — Telehealth: Payer: Self-pay | Admitting: Family Medicine

## 2020-04-11 ENCOUNTER — Telehealth (INDEPENDENT_AMBULATORY_CARE_PROVIDER_SITE_OTHER): Payer: Medicare Other | Admitting: Family Medicine

## 2020-04-11 ENCOUNTER — Other Ambulatory Visit: Payer: Self-pay

## 2020-04-11 DIAGNOSIS — U071 COVID-19: Secondary | ICD-10-CM

## 2020-04-11 MED ORDER — GUAIFENESIN-CODEINE 100-10 MG/5ML PO SOLN: 5.0000 mL | Freq: Three times a day (TID) | ORAL | 0 refills | Status: DC | PRN

## 2020-04-11 NOTE — Telephone Encounter (Signed)
Patient scheduled.

## 2020-04-11 NOTE — Telephone Encounter (Signed)
Would like prescription to be sent to  [place], [place] Phone:  [phone]  Fax:  [phone]

## 2020-04-11 NOTE — Telephone Encounter (Signed)
Please call and check on patient. If having concerning symptoms such as CP or SOB recommend she be evaluated ASAP, otherwise can continue conservative management with fluids and OTC medications.    Does not need OV unless having significant symptoms or if symptoms not improving over the next 7-10 days.  Martha Orr. [name], MD 04/11/2020 10:25 AM

## 2020-04-11 NOTE — Telephone Encounter (Signed)
Recommend scheduling virtual visit if having shortness of breath.  Algis Greenhouse. [name], MD 04/11/2020 12:36 PM

## 2020-04-11 NOTE — Telephone Encounter (Signed)
Patient called in this morning saying that she went to have a CT scan done yesterday and they told the patient it looked like she has covid pneumonia, was advised to follow up with Dr.Parker.

## 2020-04-11 NOTE — Progress Notes (Signed)
   Martha Orr is a 64 y.o. female who presents today for a virtual office visit.  Assessment/Plan:  New/Acute Problems: COVID-19 Evidence of Covid pneumonia on CT.  Appears well on visual exam today.  Will treat symptomatically.  Will send in guaifenesin-codeine cough syrup.  Recommended over-the-counter meds and good oral hydration.  Discussed warning signs and reasons to seek emergent care.    Subjective:  HPI:  Patient here with concern for Covid pneumonia.  Yesterday had a cardiac CT performed.  Incidentally was found to have atypical appearing pneumonia.  Has been having symptoms for the past few days including cough, congestion, myalgias.  Had one Covid test was positive and one was negative.  She is having some shortness of breath.  Overall symptoms seem to be stable.  No specific treatments tried.  Her husband has been sick with similar symptoms.       Objective/Observations  Physical Exam: Gen: NAD, resting comfortably Pulm: Normal work of breathing Neuro: Grossly normal, moves all extremities Psych: Normal affect and thought content  Virtual Visit via Video   I connected with Martha Orr on 04/11/20 at  2:00 PM EDT by a video enabled telemedicine application and verified that I am speaking with the correct person using two identifiers. The limitations of evaluation and management by telemedicine and the availability of in person appointments were discussed. The patient expressed understanding and agreed to proceed.   Patient location: Home Provider location: [place] participating in the virtual visit: Myself and Patient     Algis Greenhouse. [name], MD 04/11/2020 1:07 PM

## 2020-04-11 NOTE — Telephone Encounter (Signed)
Pt stated no CH some SHOB   can continue conservative management with fluids and OTC medications.

## 2020-04-21 ENCOUNTER — Telehealth: Payer: Self-pay | Admitting: Family Medicine

## 2020-04-21 NOTE — Telephone Encounter (Signed)
Please advise 

## 2020-04-21 NOTE — Telephone Encounter (Signed)
Patient notified she will schedule appt

## 2020-04-21 NOTE — Telephone Encounter (Signed)
Recommend office visit to discuss.  Algis Greenhouse. [name], MD 04/21/2020 2:27 PM

## 2020-04-21 NOTE — Telephone Encounter (Signed)
Spoke to pt's spouse, Jeneen Rinks. He is concerned about what the CT scan on 4/15 said. He said there were some spots found.   Jeneen Rinks stated that the pt worked around asbestos and her brother died this past 02-28-2023 to asbestos cancer.  He would like a call back on his cell phone [phone]

## 2020-04-21 NOTE — Telephone Encounter (Signed)
Please schedule a office visit

## 2020-04-23 ENCOUNTER — Other Ambulatory Visit: Payer: Self-pay

## 2020-04-23 ENCOUNTER — Ambulatory Visit: Payer: Medicare Other | Admitting: Family Medicine

## 2020-04-23 ENCOUNTER — Ambulatory Visit (HOSPITAL_COMMUNITY): Payer: Medicare Other | Attending: Cardiovascular Disease

## 2020-04-23 VITALS — BP 122/80 | HR 67 | Temp 98.0°F | Ht 66.5 in | Wt 180.0 lb

## 2020-04-23 DIAGNOSIS — E039 Hypothyroidism, unspecified: Secondary | ICD-10-CM

## 2020-04-23 DIAGNOSIS — F419 Anxiety disorder, unspecified: Secondary | ICD-10-CM | POA: Diagnosis not present

## 2020-04-23 DIAGNOSIS — R0602 Shortness of breath: Secondary | ICD-10-CM | POA: Insufficient documentation

## 2020-04-23 DIAGNOSIS — E785 Hyperlipidemia, unspecified: Secondary | ICD-10-CM | POA: Diagnosis not present

## 2020-04-23 DIAGNOSIS — H9203 Otalgia, bilateral: Secondary | ICD-10-CM

## 2020-04-23 DIAGNOSIS — R918 Other nonspecific abnormal finding of lung field: Secondary | ICD-10-CM | POA: Diagnosis not present

## 2020-04-23 MED ORDER — CIPROFLOXACIN-DEXAMETHASONE 0.3-0.1 % OT SUSP: 4.0000 [drp] | Freq: Two times a day (BID) | OTIC | 0 refills | Status: DC

## 2020-04-23 MED ORDER — LEVOTHYROXINE SODIUM 112 MCG PO TABS: 112.0000 ug | ORAL_TABLET | Freq: Every day | ORAL | 3 refills | Status: DC

## 2020-04-23 MED ORDER — ROSUVASTATIN CALCIUM 20 MG PO TABS: 20.0000 mg | ORAL_TABLET | Freq: Every day | ORAL | 3 refills | Status: DC

## 2020-04-23 MED ORDER — ESCITALOPRAM OXALATE 20 MG PO TABS: 20.0000 mg | ORAL_TABLET | Freq: Every day | ORAL | 3 refills | Status: DC

## 2020-04-23 NOTE — Assessment & Plan Note (Signed)
No red flags.  Has some nasal irritation.  Will start Ciprodex drops to use as needed.

## 2020-04-23 NOTE — Assessment & Plan Note (Signed)
Continue Synthroid 112 mcg daily. 

## 2020-04-23 NOTE — Assessment & Plan Note (Signed)
- 

## 2020-04-23 NOTE — Assessment & Plan Note (Signed)
Refilled Lexapro 20 mg daily.

## 2020-04-23 NOTE — Assessment & Plan Note (Signed)
Likely related to Covid pneumonia.  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

## 2020-04-23 NOTE — Patient Instructions (Signed)
It was very nice to see you today!  I will refill your medications.  Please try the eardrops.  You have nodules in your lungs-these are due to inflammation from your pneumonia.  This is nothing to worry about.  We will check a repeat chest CT in a year.  Take care, Dr [name]  Please try these tips to maintain a healthy lifestyle:   Eat at least 3 REAL meals and 1-2 snacks per day.  Aim for no more than 5 hours between eating.  If you eat breakfast, please do so within one hour of getting up.    Each meal should contain half fruits/vegetables, one quarter protein, and one quarter carbs (no bigger than a computer mouse)   Cut down on sweet beverages. This includes juice, soda, and sweet tea.     Drink at least 1 glass of water with each meal and aim for at least 8 glasses per day   Exercise at least 150 minutes every week.

## 2020-04-23 NOTE — Progress Notes (Signed)
   MAIANA HUFFMASTER is a 64 y.o. female who presents today for an office visit.  Assessment/Plan:  Chronic Problems Addressed Today: Otalgia of both ears No red flags.  Has some nasal irritation.  Will start Ciprodex drops to use as needed.  Pulmonary nodules Likely related to Covid pneumonia.  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  Hypothyroidism Continue Synthroid 112 mcg daily.  Dyslipidemia Refilled Crestor 20 mg daily.  Anxiety Refilled Lexapro 20 mg daily.     Subjective:  HPI:  Patient here for abnormal CT follow-up.  She was diagnosed with Covid pneumonia few weeks ago.  On the CT scan there was a few pulmonary nodules.  She is concerned about this.  She lost her brother to lung cancer couple months ago.  She also has previously worked and [employer] and is concerned of possible asbestosis exposure.  She is also had bilateral irritation for several years.  Tried using earplugs with no significant improvement.       Objective:  Physical Exam: BP 122/80 (BP Location: Left Arm, Patient Position: Sitting, Cuff Size: Normal)   Pulse 67   Temp 98 F (36.7 C) (Temporal)   Ht 5' 6.5" (1.689 m)   Wt 180 lb (81.6 kg)   SpO2 97%   BMI 28.62 kg/m   Gen: No acute distress, resting comfortably HEENT: Bilateral EAC with irritation.  TMs clear. CV: Regular rate and rhythm with no murmurs appreciated Pulm: Normal work of breathing, clear to auscultation bilaterally with no crackles, wheezes, or rhonchi Neuro: Grossly normal, moves all extremities Psych: Normal affect and thought content      Percival Glasheen M. [name], MD 04/23/2020 11:28 AM

## 2020-05-21 DIAGNOSIS — R09A2 Foreign body sensation, throat: Secondary | ICD-10-CM | POA: Insufficient documentation

## 2020-05-21 DIAGNOSIS — H938X2 Other specified disorders of left ear: Secondary | ICD-10-CM | POA: Insufficient documentation

## 2020-05-21 DIAGNOSIS — K219 Gastro-esophageal reflux disease without esophagitis: Secondary | ICD-10-CM | POA: Insufficient documentation

## 2020-05-21 DIAGNOSIS — R053 Chronic cough: Secondary | ICD-10-CM | POA: Insufficient documentation

## 2020-06-05 ENCOUNTER — Other Ambulatory Visit: Payer: Self-pay

## 2020-06-05 ENCOUNTER — Emergency Department (HOSPITAL_COMMUNITY)
Admission: EM | Admit: 2020-06-05 | Discharge: 2020-06-05 | Disposition: A | Discharge status: Home / Self Care | Payer: Medicare Other | Attending: Emergency Medicine | Admitting: Emergency Medicine

## 2020-06-05 ENCOUNTER — Emergency Department (HOSPITAL_COMMUNITY): Payer: Medicare Other

## 2020-06-05 ENCOUNTER — Encounter (HOSPITAL_COMMUNITY): Payer: Self-pay | Admitting: *Deleted

## 2020-06-05 DIAGNOSIS — M7918 Myalgia, other site: Secondary | ICD-10-CM | POA: Diagnosis not present

## 2020-06-05 DIAGNOSIS — Z20822 Contact with and (suspected) exposure to covid-19: Secondary | ICD-10-CM | POA: Insufficient documentation

## 2020-06-05 DIAGNOSIS — R509 Fever, unspecified: Secondary | ICD-10-CM | POA: Diagnosis not present

## 2020-06-05 DIAGNOSIS — J189 Pneumonia, unspecified organism: Secondary | ICD-10-CM | POA: Diagnosis not present

## 2020-06-05 DIAGNOSIS — Z8616 Personal history of COVID-19: Secondary | ICD-10-CM | POA: Diagnosis not present

## 2020-06-05 DIAGNOSIS — R1110 Vomiting, unspecified: Secondary | ICD-10-CM | POA: Diagnosis present

## 2020-06-05 DIAGNOSIS — R1031 Right lower quadrant pain: Secondary | ICD-10-CM | POA: Diagnosis not present

## 2020-06-05 LAB — CBC
HCT: 36.9 % (ref 36.0–46.0)
Hemoglobin: 12.8 g/dL (ref 12.0–15.0)
MCH: 31.1 pg (ref 26.0–34.0)
MCHC: 34.7 g/dL (ref 30.0–36.0)
MCV: 89.6 fL (ref 80.0–100.0)
Platelets: 177 10*3/uL (ref 150–400)
RBC: 4.12 MIL/uL (ref 3.87–5.11)
RDW: 12.6 % (ref 11.5–15.5)
WBC: 9.1 10*3/uL (ref 4.0–10.5)
nRBC: 0 % (ref 0.0–0.2)

## 2020-06-05 LAB — COMPREHENSIVE METABOLIC PANEL
ALT: 22 U/L (ref 0–44)
AST: 30 U/L (ref 15–41)
Albumin: 4.3 g/dL (ref 3.5–5.0)
Alkaline Phosphatase: 44 U/L (ref 38–126)
Anion gap: 12 (ref 5–15)
BUN: 13 mg/dL (ref 8–23)
CO2: 23 mmol/L (ref 22–32)
Calcium: 8.6 mg/dL — ABNORMAL LOW (ref 8.9–10.3)
Chloride: 100 mmol/L (ref 98–111)
Creatinine, Ser: 0.64 mg/dL (ref 0.44–1.00)
GFR calc Af Amer: >60 mL/min (ref >60)
GFR calc non Af Amer: >60 mL/min (ref >60)
Glucose, Bld: 129 mg/dL — ABNORMAL HIGH (ref 70–99)
Potassium: 3.3 mmol/L — ABNORMAL LOW (ref 3.5–5.1)
Sodium: 135 mmol/L (ref 135–145)
Total Bilirubin: 0.9 mg/dL (ref 0.3–1.2)
Total Protein: 7.5 g/dL (ref 6.5–8.1)

## 2020-06-05 LAB — URINALYSIS, ROUTINE W REFLEX MICROSCOPIC
Bilirubin Urine: NEGATIVE
Glucose, UA: NEGATIVE mg/dL
Ketones, ur: 80 mg/dL — AB
Leukocytes,Ua: NEGATIVE
Nitrite: NEGATIVE
Protein, ur: 100 mg/dL — AB
Specific Gravity, Urine: 1.026 (ref 1.005–1.030)
pH: 5 (ref 5.0–8.0)

## 2020-06-05 LAB — LIPASE, BLOOD: Lipase: 21 U/L (ref 11–51)

## 2020-06-05 LAB — SARS CORONAVIRUS 2 BY RT PCR (HOSPITAL ORDER, PERFORMED IN ~~LOC~~ HOSPITAL LAB): SARS Coronavirus 2: NEGATIVE

## 2020-06-05 MED ORDER — LEVOFLOXACIN 750 MG PO TABS
750.0000 mg | ORAL_TABLET | Freq: Once | ORAL | Status: AC
  Administered 2020-06-05: 750 mg via ORAL
  Filled 2020-06-05: qty 1

## 2020-06-05 MED ORDER — LEVOFLOXACIN 750 MG PO TABS: 750.0000 mg | ORAL_TABLET | Freq: Every day | ORAL | 0 refills | Status: AC

## 2020-06-05 MED ORDER — ONDANSETRON 4 MG PO TBDP: 4.0000 mg | ORAL_TABLET | Freq: Three times a day (TID) | ORAL | 1 refills | Status: DC | PRN

## 2020-06-05 MED ORDER — ACETAMINOPHEN 500 MG PO TABS
1000.0000 mg | ORAL_TABLET | Freq: Once | ORAL | Status: AC
  Administered 2020-06-05: 1000 mg via ORAL
  Filled 2020-06-05: qty 2

## 2020-06-05 MED ORDER — ONDANSETRON 4 MG PO TBDP
4.0000 mg | ORAL_TABLET | Freq: Once | ORAL | Status: AC | PRN
  Administered 2020-06-05: 4 mg via ORAL
  Filled 2020-06-05: qty 1

## 2020-06-05 NOTE — ED Triage Notes (Signed)
Abdominal pain with nausea and vomiting x 4 days

## 2020-06-05 NOTE — ED Notes (Signed)
Stayed in bed yesterday per spouse   Felt bad for 4 days   Vomited once today   RLQ pain today   abd soft non tender   Hx appy

## 2020-06-05 NOTE — ED Notes (Signed)
Dr Earnest Conroy has seen   Awaiting disposition

## 2020-06-05 NOTE — ED Notes (Signed)
Call to lab  17 more minute on covid result

## 2020-06-05 NOTE — ED Notes (Signed)
Rad in

## 2020-06-05 NOTE — ED Provider Notes (Signed)
[facility] Provider Note   CSN: [id number] Arrival date & time: 06/05/20  1753     History Chief Complaint  Patient presents with  . Headache  . Emesis    Martha Orr is a 64 y.o. female.  Patient has felt bad for 4 days.  Stayed in bed yesterday.  Vomited once today and once yesterday.  The patient with a complaint of some right lower quadrant abdominal pain today.  But none currently.  Patient did have natural Covid infection back in March along with her husband which was documented.  Patient has not had the Covid vaccine.  Patient's also had fever chills some body aches.  No diarrhea.  No upper respiratory symptoms.  No cough.        Past Medical History:  Diagnosis Date  . Arthritis   . Chronic back pain    buldging disc  . Depression    takes Lexapro daily  . Family history of adverse reaction to anesthesia    sister gets sick after anesthesia  . High cholesterol    takes Crestor daily  . History of colon polyps    benign  . History of shingles   . Hypothyroidism    takes Synthroid daily  . Joint pain   . Joint swelling   . Microscopic hematuria    states her entire life and family is the same way    Patient Active Problem List   Diagnosis Date Noted  . Pulmonary nodules 04/23/2020  . Otalgia of both ears 04/23/2020  . Major depressive disorder, single episode, in remission ([place]) 08/02/2019  . Anxiety 08/02/2019  . Dyslipidemia 08/02/2019  . Hypothyroidism 08/02/2019  . 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 Osteoarthritis 08/02/2019  . Restless legs 08/02/2019  . Bilateral primary osteoarthritis of knee 10/12/2016    Past Surgical History:  Procedure Laterality Date  . ABDOMINAL HYSTERECTOMY  1998  . APPENDECTOMY    . BREAST BIOPSY    . BUNIONECTOMY Bilateral   . CARPAL TUNNEL RELEASE Bilateral   . CHOLECYSTECTOMY    . COLONOSCOPY    . KNEE ARTHROSCOPY Right   . KNEE ARTHROSCOPY Left 11/01/2017   Procedure: ARTHROSCOPY LEFT  KNEE;  Surgeon: Melrose Nakayama, MD;  Location: [facility];  Service: Orthopedics;  Laterality: Left;  . knot removed from right breast    . NASAL SINUS SURGERY    . TONSILLECTOMY    . TONSILLECTOMY AND ADENOIDECTOMY  2002  . TOTAL KNEE ARTHROPLASTY Bilateral 10/12/2016   Procedure: TOTAL KNEE BILATERAL;  Surgeon: Melrose Nakayama, MD;  Location: [facility];  Service: Orthopedics;  Laterality: Bilateral;     OB History   No obstetric history on file.     Family History  Problem Relation Age of Onset  . Heart attack Mother   . Hyperlipidemia Mother   . Arthritis Father   . Arthritis Sister   . Arthritis Sister   . Heart attack Sister   . Arthritis Sister   . Hyperlipidemia Sister   . Stroke Sister   . Cancer Brother   . Depression Brother   . Heart attack Brother   . Heart disease Brother   . Hypertension Brother   . Hyperlipidemia Brother   . Cancer Brother   . Heart attack Brother     Social History   Tobacco Use  . Smoking status: Never Smoker  . Smokeless tobacco: Never Used  Vaping Use  . Vaping Use: Never used  Substance Use  Topics  . Alcohol use: Yes    Comment: occasionally  . Drug use: No    Home Medications Prior to Admission medications   Medication Sig Start Date End Date Taking? Authorizing Provider  acyclovir (ZOVIRAX) 400 MG tablet  07/05/19   [provider]  amoxicillin (AMOXIL) 500 MG capsule TAKE FOUR CAPSULES BY MOUTH ONE HOUR BEFORE APPOINTMENT 06/21/19   [provider]  aspirin EC 81 MG tablet Take 81 mg by mouth daily.    [provider]  azelastine (ASTELIN) 0.1 % nasal spray Place 2 sprays into both nostrils 2 (two) times daily. 02/06/20   Vivi Barrack, MD  Calcium Carbonate-Vit D-Min (CALCIUM 600+D3 PLUS MINERALS PO) Take 1 tablet by mouth at bedtime.    [provider]  cholecalciferol (VITAMIN D) 1000 units tablet Take 1,000 Units by mouth at bedtime.    [provider]  ciprofloxacin-dexamethasone  (CIPRODEX) OTIC suspension Place 4 drops into both ears 2 (two) times daily. 04/23/20   Vivi Barrack, MD  escitalopram (LEXAPRO) 20 MG tablet Take 1 tablet (20 mg total) by mouth daily. 04/23/20   Vivi Barrack, MD  Ginkgo Biloba 40 MG TABS Take by mouth.    [provider]  guaiFENesin-codeine 100-10 MG/5ML syrup Take 5 mLs by mouth 3 (three) times daily as needed for cough. 04/11/20   Vivi Barrack, MD  levofloxacin (LEVAQUIN) 750 MG tablet Take 1 tablet (750 mg total) by mouth daily for 7 days. 06/05/20 06/12/20  Fredia Sorrow, MD  levothyroxine (SYNTHROID) 112 MCG tablet Take 1 tablet (112 mcg total) by mouth at bedtime. 04/23/20   Vivi Barrack, MD  metoprolol tartrate (LOPRESSOR) 100 MG tablet Take 1 tablet by mouth 2 hours prior to Cardiac CT Patient not taking: Reported on 04/23/2020 04/08/20   Jettie Booze, MD  ondansetron (ZOFRAN ODT) 4 MG disintegrating tablet Take 1 tablet (4 mg total) by mouth every 8 (eight) hours as needed. 06/05/20   Fredia Sorrow, MD  PENNSAID 2 % SOLN Apply 1 application topically 2 (two) times daily as needed. To affected areas for pain. 10/11/17   [provider]  rosuvastatin (CRESTOR) 20 MG tablet Take 1 tablet (20 mg total) by mouth at bedtime. 04/23/20   Vivi Barrack, MD    Allergies    No known allergies  Review of Systems   Review of Systems  Constitutional: Positive for chills, fatigue and fever.  HENT: Negative for congestion, rhinorrhea and sore throat.   Eyes: Negative for visual disturbance.  Respiratory: Negative for cough and shortness of breath.   Cardiovascular: Negative for chest pain and leg swelling.  Gastrointestinal: Positive for abdominal pain, nausea and vomiting. Negative for diarrhea.  Genitourinary: Negative for dysuria.  Musculoskeletal: Positive for myalgias. Negative for back pain and neck pain.  Skin: Negative for rash.  Neurological: Negative for dizziness, light-headedness and headaches.    Hematological: Does not bruise/bleed easily.  Psychiatric/Behavioral: Negative for confusion.    Physical Exam Updated Vital Signs BP 125/77 (BP Location: Right Arm)   Pulse 77   Temp 98.8 F (37.1 C) (Oral)   Resp 20   Ht 1.689 m (5' 6.5")   Wt 79.4 kg   SpO2 95%   BMI 27.82 kg/m   Physical Exam Vitals and nursing note reviewed.  Constitutional:      General: She is not in acute distress.    Appearance: Normal appearance. She is well-developed.  HENT:  Head: Normocephalic and atraumatic.     Mouth/Throat:     Mouth: Mucous membranes are moist.  Eyes:     Extraocular Movements: Extraocular movements intact.     Conjunctiva/sclera: Conjunctivae normal.     Pupils: Pupils are equal, round, and reactive to light.  Cardiovascular:     Rate and Rhythm: Normal rate and regular rhythm.     Heart sounds: No murmur heard.   Pulmonary:     Effort: Pulmonary effort is normal. No respiratory distress.     Breath sounds: Normal breath sounds.  Abdominal:     General: There is no distension.     Palpations: Abdomen is soft.     Tenderness: There is no abdominal tenderness. There is no guarding.     Comments: Abdomen completely nontender.  Musculoskeletal:        General: No swelling. Normal range of motion.     Cervical back: Normal range of motion and neck supple.  Skin:    General: Skin is warm and dry.  Neurological:     General: No focal deficit present.     Mental Status: She is alert and oriented to person, place, and time.     Cranial Nerves: No cranial nerve deficit.     Sensory: No sensory deficit.     ED Results / Procedures / Treatments   Labs (all labs ordered are listed, but only abnormal results are displayed) Labs Reviewed  COMPREHENSIVE METABOLIC PANEL - Abnormal; Notable for the following components:      Result Value   Potassium 3.3 (*)    Glucose, Bld 129 (*)    Calcium 8.6 (*)    All other components within normal limits  URINALYSIS, ROUTINE  W REFLEX MICROSCOPIC - Abnormal; Notable for the following components:   APPearance HAZY (*)    Hgb urine dipstick LARGE (*)    Ketones, ur 80 (*)    Protein, ur 100 (*)    Bacteria, UA RARE (*)    All other components within normal limits  SARS CORONAVIRUS 2 BY RT PCR (HOSPITAL ORDER, [facility])  URINE CULTURE  LIPASE, BLOOD  CBC    EKG None  Radiology DG Chest Port 1 View  Result Date: 06/05/2020 CLINICAL DATA:  Generalized malaise for 4 days, fever, nausea and vomiting EXAM: PORTABLE CHEST 1 VIEW COMPARISON:  10/14/2016 FINDINGS: Single frontal view of the chest demonstrates an unremarkable cardiac silhouette. There is patchy airspace disease at the left lung base. Trace left pleural effusion is noted. No pneumothorax. Right chest is clear. IMPRESSION: 1. Patchy left basilar consolidation and trace left pleural effusion, compatible with bronchopneumonia. Electronically Signed   By: Randa Ngo M.D.   On: 06/05/2020 20:51    Procedures Procedures (including critical care time)  Medications Ordered in ED Medications  levofloxacin (LEVAQUIN) tablet 750 mg (has no administration in time range)  ondansetron (ZOFRAN-ODT) disintegrating tablet 4 mg (4 mg Oral Given 06/05/20 1925)  acetaminophen (TYLENOL) tablet 1,000 mg (1,000 mg Oral Given 06/05/20 2111)    ED Course  I have reviewed the triage vital signs and the nursing notes.  Pertinent labs & imaging results that were available during my care of the patient were reviewed by me and considered in my medical decision making (see chart for details).    MDM Rules/Calculators/A&P  Chest x-ray consistent with a left lower lobe pneumonia.  But in conversations with the patient she used to work at [employer].  There is some sort of lung finding that they are following yearly.  Patient does have a fever here.  Belly is completely benign.  Do not feel there is an acute abdominal  process.  There is no leukocytosis labs are normal.  Urinalysis is negative for urinary tract infection.  We will go ahead and treat the pneumonia with Levaquin.  First dose here tonight.  Patient will need follow-up chest x-ray couple weeks down the road to make sure things clear on the chest.  Patient will return if worse.  We will also treat with Zofran.  Gave Zofran here felt better.  Patient not with significant amounts of vomiting or diarrhea to suggest gastroenteritis.  Feel that it may very well be related to the pneumonia.  And will treat for that.  Patient Covid testing was negative but patient had the natural infection back in March. Electrolytes only significant for mild hypokalemia.   Final Clinical Impression(s) / ED Diagnoses Final diagnoses:  Community acquired pneumonia of left lower lobe of lung    Rx / DC Orders ED Discharge Orders         Ordered    levofloxacin (LEVAQUIN) 750 MG tablet  Daily     Discontinue  Reprint     06/05/20 2320    ondansetron (ZOFRAN ODT) 4 MG disintegrating tablet  Every 8 hours PRN     Discontinue  Reprint     06/05/20 2320           Fredia Sorrow, MD 06/05/20 2329

## 2020-06-05 NOTE — Discharge Instructions (Signed)
Take the antibiotic Levaquin daily for the next 7 days.  Would expect improvement over the next couple days.  Return for any new or worse symptoms.  Once you are feeling better with recommend follow-up chest x-ray.  Take Zofran as needed for nausea.  Take Tylenol for the fever.

## 2020-06-05 NOTE — ED Notes (Signed)
Drinking water   No complaint of N at this time

## 2020-06-06 ENCOUNTER — Telehealth (INDEPENDENT_AMBULATORY_CARE_PROVIDER_SITE_OTHER): Payer: Medicare Other | Admitting: Family Medicine

## 2020-06-06 ENCOUNTER — Encounter: Payer: Self-pay | Admitting: Family Medicine

## 2020-06-06 DIAGNOSIS — T50905A Adverse effect of unspecified drugs, medicaments and biological substances, initial encounter: Secondary | ICD-10-CM | POA: Diagnosis not present

## 2020-06-06 DIAGNOSIS — J180 Bronchopneumonia, unspecified organism: Secondary | ICD-10-CM

## 2020-06-06 MED ORDER — AZITHROMYCIN 250 MG PO TABS: ORAL_TABLET | ORAL | 0 refills | Status: DC

## 2020-06-06 NOTE — Progress Notes (Signed)
Virtual Visit via Video Note  Subjective  CC:  Chief Complaint  Patient presents with  . Fever, Headache, Nausea     x 3 days. She has taken some Tylenol today. Doesn't feel like she has a fever at present.      I connected with [name] on 06/06/20 at  2:00 PM EDT by a video enabled telemedicine application and verified that I am speaking with the correct person using two identifiers. Location patient: Home Location provider: [facility] [place], Office Persons participating in the virtual visit: Martha Orr, Leamon Arnt, MD Maryelizabeth Kaufmann, [place] discussed the limitations of evaluation and management by telemedicine and the availability of in person appointments. The patient expressed understanding and agreed to proceed. HPI: Martha Orr is a 64 y.o. female who was contacted today to address the problems listed above in the chief complaint. . 64 yo was evaluated yesterday in ER: I've reviewed notes: dxd with left patchy basilar bronchopneumonia; I reviewed xray and labs and notes. Started on levoquin however it is given her diarrhea: "going right through me". Has had several episodes of loose stools since starting with first dose in ER last night. No fever today. Breathing is stable. Mild cough. No more abdominal pain.  No visits with results within 1 Day(s) from this visit.  Latest known visit with results is:  Admission on 06/05/2020, Discharged on 06/05/2020  Component Date Value Ref Range Status  . Lipase 06/05/2020 21  11 - 51 U/L Final  . Sodium 06/05/2020 135  135 - 145 mmol/L Final  . Potassium 06/05/2020 3.3* 3.5 - 5.1 mmol/L Final  . Chloride 06/05/2020 100  98 - 111 mmol/L Final  . CO2 06/05/2020 23  22 - 32 mmol/L Final  . Glucose, Bld 06/05/2020 129* 70 - 99 mg/dL Final  . BUN 06/05/2020 13  8 - 23 mg/dL Final  . Creatinine, Ser 06/05/2020 0.64  0.44 - 1.00 mg/dL Final  . Calcium 06/05/2020 8.6* 8.9 - 10.3 mg/dL Final  .  Total Protein 06/05/2020 7.5  6.5 - 8.1 g/dL Final  . Albumin 06/05/2020 4.3  3.5 - 5.0 g/dL Final  . AST 06/05/2020 30  15 - 41 U/L Final  . ALT 06/05/2020 22  0 - 44 U/L Final  . Alkaline Phosphatase 06/05/2020 44  38 - 126 U/L Final  . Total Bilirubin 06/05/2020 0.9  0.3 - 1.2 mg/dL Final  . GFR calc non Af Amer 06/05/2020 >60  >60 mL/min Final  . GFR calc Af Amer 06/05/2020 >60  >60 mL/min Final  . Anion gap 06/05/2020 12  5 - 15 Final  . WBC 06/05/2020 9.1  4.0 - 10.5 K/uL Final  . RBC 06/05/2020 4.12  3.87 - 5.11 MIL/uL Final  . Hemoglobin 06/05/2020 12.8  12.0 - 15.0 g/dL Final  . HCT 06/05/2020 36.9  36 - 46 % Final  . MCV 06/05/2020 89.6  80.0 - 100.0 fL Final  . MCH 06/05/2020 31.1  26.0 - 34.0 pg Final  . MCHC 06/05/2020 34.7  30.0 - 36.0 g/dL Final  . RDW 06/05/2020 12.6  11.5 - 15.5 % Final  . Platelets 06/05/2020 177  150 - 400 K/uL Final  . nRBC 06/05/2020 0.0  0.0 - 0.2 % Final  . Color, Urine 06/05/2020 YELLOW  YELLOW Final  . APPearance 06/05/2020 HAZY* CLEAR Final  . Specific Gravity, Urine 06/05/2020 1.026  1.005 - 1.030 Final  .  pH 06/05/2020 5.0  5.0 - 8.0 Final  . Glucose, UA 06/05/2020 NEGATIVE  NEGATIVE mg/dL Final  . Hgb urine dipstick 06/05/2020 LARGE* NEGATIVE Final  . Bilirubin Urine 06/05/2020 NEGATIVE  NEGATIVE Final  . Ketones, ur 06/05/2020 80* NEGATIVE mg/dL Final  . Protein, ur 06/05/2020 100* NEGATIVE mg/dL Final  . Nitrite 06/05/2020 NEGATIVE  NEGATIVE Final  . Chalmers Guest 06/05/2020 NEGATIVE  NEGATIVE Final  . RBC / HPF 06/05/2020 6-10  0 - 5 RBC/hpf Final  . WBC, UA 06/05/2020 0-5  0 - 5 WBC/hpf Final  . Bacteria, UA 06/05/2020 RARE* NONE SEEN Final  . Mucus 06/05/2020 PRESENT   Final  . SARS Coronavirus 2 06/05/2020 NEGATIVE  NEGATIVE Final   CLINICAL DATA:  Generalized malaise for 4 days, fever, nausea and vomiting  EXAM: PORTABLE CHEST 1 VIEW  COMPARISON:  10/14/2016  FINDINGS: Single frontal view of the chest demonstrates an  unremarkable cardiac silhouette. There is patchy airspace disease at the left lung base. Trace left pleural effusion is noted. No pneumothorax. Right chest is clear.  IMPRESSION: 1. Patchy left basilar consolidation and trace left pleural effusion, compatible with bronchopneumonia.   Electronically Signed   By: Randa Ngo M.D.   On: 06/05/2020 20:51 Assessment  1. Bronchopneumonia   2. Adverse effect due to correct medicinal substance, properly given, initial encounter      Plan   CAP:  Stop levaquin due to side effect/diarrhea. Avoid augmentin due to possibility of same. Change to Zpak and f/u in 2 weeks for recheck. Sooner if worsens.   I discussed the assessment and treatment plan with the patient. The patient was provided an opportunity to ask questions and all were answered. The patient agreed with the plan and demonstrated an understanding of the instructions.   The patient was advised to call back or seek an in-person evaluation if the symptoms worsen or if the condition fails to improve as anticipated. Follow up: 2 weeks for recheck  Visit date not found  Meds ordered this encounter  Medications  . azithromycin (ZITHROMAX) 250 MG tablet    Sig: Take 2 tabs today, then 1 tab daily for 4 days    Dispense:  1 each    Refill:  0      I reviewed the patients updated PMH, FH, and SocHx.    Patient Active Problem List   Diagnosis Date Noted  . Pulmonary nodules 04/23/2020  . Otalgia of both ears 04/23/2020  . Major depressive disorder, single episode, in remission ([place]) 08/02/2019  . Anxiety 08/02/2019  . Dyslipidemia 08/02/2019  . Hypothyroidism 08/02/2019  . 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 Osteoarthritis 08/02/2019  . Restless legs 08/02/2019  . Bilateral primary osteoarthritis of knee 10/12/2016   Current Meds  Medication Sig  . acyclovir (ZOVIRAX) 400 MG tablet   . amoxicillin (AMOXIL) 500 MG capsule TAKE FOUR CAPSULES BY MOUTH ONE HOUR BEFORE  APPOINTMENT  . aspirin EC 81 MG tablet Take 81 mg by mouth daily.  Marland Kitchen azelastine (ASTELIN) 0.1 % nasal spray Place 2 sprays into both nostrils 2 (two) times daily.  . Calcium Carbonate-Vit D-Min (CALCIUM 600+D3 PLUS MINERALS PO) Take 1 tablet by mouth at bedtime.  . cholecalciferol (VITAMIN D) 1000 units tablet Take 1,000 Units by mouth at bedtime.  . ciprofloxacin-dexamethasone (CIPRODEX) OTIC suspension Place 4 drops into both ears 2 (two) times daily.  Marland Kitchen escitalopram (LEXAPRO) 20 MG tablet Take 1 tablet (20 mg total) by mouth daily.  . Ginkgo Biloba  40 MG TABS Take by mouth.  . levothyroxine (SYNTHROID) 112 MCG tablet Take 1 tablet (112 mcg total) by mouth at bedtime.  . rosuvastatin (CRESTOR) 20 MG tablet Take 1 tablet (20 mg total) by mouth at bedtime.    Allergies: Patient is allergic to no known allergies. Family History: Patient family history includes Arthritis in her father, sister, sister, and sister; Cancer in her brother and brother; Depression in her brother; Heart attack in her brother, brother, mother, and sister; Heart disease in her brother; Hyperlipidemia in her brother, mother, and sister; Hypertension in her brother; Stroke in her sister. Social History:  Patient  reports that she has never smoked. She has never used smokeless tobacco. She reports current alcohol use. She reports that she does not use drugs.  Review of Systems: Constitutional: Negative for fever malaise or anorexia Cardiovascular: negative for chest pain Respiratory: negative for SOB or persistent cough Gastrointestinal: negative for abdominal pain  OBJECTIVE Vitals: There were no vitals taken for this visit. General: no acute distress , A&Ox3 No respiratory distress Leamon Arnt, MD

## 2020-06-08 LAB — URINE CULTURE: Culture: 40000 — AB

## 2020-06-16 ENCOUNTER — Encounter: Payer: Self-pay | Admitting: Physician Assistant

## 2020-06-16 ENCOUNTER — Telehealth (INDEPENDENT_AMBULATORY_CARE_PROVIDER_SITE_OTHER): Payer: Medicare Other | Admitting: Physician Assistant

## 2020-06-16 ENCOUNTER — Telehealth: Payer: Self-pay | Admitting: *Deleted

## 2020-06-16 ENCOUNTER — Telehealth: Payer: Self-pay | Admitting: Family Medicine

## 2020-06-16 DIAGNOSIS — R399 Unspecified symptoms and signs involving the genitourinary system: Secondary | ICD-10-CM

## 2020-06-16 MED ORDER — NITROFURANTOIN MONOHYD MACRO 100 MG PO CAPS: 100.0000 mg | ORAL_CAPSULE | Freq: Two times a day (BID) | ORAL | 0 refills | Status: DC

## 2020-06-16 NOTE — Telephone Encounter (Signed)
Pt husband notified.voice understanding

## 2020-06-16 NOTE — Telephone Encounter (Signed)
Pt called asking if CMA will call her back. Pt is experiencing urinary symptoms. Please advise.

## 2020-06-16 NOTE — Telephone Encounter (Signed)
LVM to return call.

## 2020-06-16 NOTE — Telephone Encounter (Signed)
She was found to have pneumonia - but otherwise her recent xray was clear. Like we discussed at her last OV, we should repeat a CT scan next year.  Algis Greenhouse. [name], MD 06/16/2020 3:53 PM

## 2020-06-16 NOTE — Progress Notes (Signed)
TELEPHONE ENCOUNTER   Patient verbally agreed to telephone visit and is aware that copayment and coinsurance may apply. Patient was treated using telemedicine according to accepted telemedicine protocols.  Location of the patient: home Location of provider: [place] of all persons participating in the telemedicine service and role in the encounter: Martha Orr, [place] , Martha Orr  Subjective:   Chief Complaint  Patient presents with  . Urinary Tract Infection    abdominal pain Rt side, frequency urination,      HPI   UTI Symptoms Has R-side pain and sensation of feeling like she is not emptying bladder all of the pain. No pain with urination, nausea, vomiting, fever, chills, hematuria. Denies kidney stones.  Symptoms started a couple of days ago. Hx of UTI and this a typical presentation for her. She lives far away and is unable to make it into our office for a urine specimen today. Her husband is quarantining for hip surgery that he is having tomorrow.  Patient Active Problem List   Diagnosis Date Noted  . Pulmonary nodules 04/23/2020  . Otalgia of both ears 04/23/2020  . Major depressive disorder, single episode, in remission ([place]) 08/02/2019  . Anxiety 08/02/2019  . Dyslipidemia 08/02/2019  . Hypothyroidism 08/02/2019  . 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 Osteoarthritis 08/02/2019  . Restless legs 08/02/2019  . Bilateral primary osteoarthritis of knee 10/12/2016   Social History   Tobacco Use  . Smoking status: Never Smoker  . Smokeless tobacco: Never Used  Substance Use Topics  . Alcohol use: Yes    Comment: occasionally    Current Outpatient Medications:  .  acyclovir (ZOVIRAX) 400 MG tablet, , Disp: , Rfl:  .  aspirin EC 81 MG tablet, Take 81 mg by mouth daily., Disp: , Rfl:  .  escitalopram (LEXAPRO) 20 MG tablet, Take 1 tablet (20 mg total) by mouth daily., Disp: 90 tablet, Rfl: 3 .  Ginkgo Biloba 40 MG TABS, Take by mouth., Disp: ,  Rfl:  .  levothyroxine (SYNTHROID) 112 MCG tablet, Take 1 tablet (112 mcg total) by mouth at bedtime., Disp: 90 tablet, Rfl: 3 .  rosuvastatin (CRESTOR) 20 MG tablet, Take 1 tablet (20 mg total) by mouth at bedtime., Disp: 90 tablet, Rfl: 3 .  amoxicillin (AMOXIL) 500 MG capsule, TAKE FOUR CAPSULES BY MOUTH ONE HOUR BEFORE APPOINTMENT (Patient not taking: Reported on 06/16/2020), Disp: , Rfl:  .  azelastine (ASTELIN) 0.1 % nasal spray, Place 2 sprays into both nostrils 2 (two) times daily., Disp: 30 mL, Rfl: 12 .  azithromycin (ZITHROMAX) 250 MG tablet, Take 2 tabs today, then 1 tab daily for 4 days, Disp: 1 each, Rfl: 0 .  Calcium Carbonate-Vit D-Min (CALCIUM 600+D3 PLUS MINERALS PO), Take 1 tablet by mouth at bedtime. (Patient not taking: Reported on 06/16/2020), Disp: , Rfl:  .  cholecalciferol (VITAMIN D) 1000 units tablet, Take 1,000 Units by mouth at bedtime. (Patient not taking: Reported on 06/16/2020), Disp: , Rfl:  .  ciprofloxacin-dexamethasone (CIPRODEX) OTIC suspension, Place 4 drops into both ears 2 (two) times daily. (Patient not taking: Reported on 06/16/2020), Disp: 7.5 mL, Rfl: 0 .  guaiFENesin-codeine 100-10 MG/5ML syrup, Take 5 mLs by mouth 3 (three) times daily as needed for cough. (Patient not taking: Reported on 06/06/2020), Disp: 120 mL, Rfl: 0 .  metoprolol tartrate (LOPRESSOR) 100 MG tablet, Take 1 tablet by mouth 2 hours prior to Cardiac CT (Patient not taking: Reported on 04/23/2020), Disp: 1  tablet, Rfl: 0 .  nitrofurantoin, macrocrystal-monohydrate, (MACROBID) 100 MG capsule, Take 1 capsule (100 mg total) by mouth 2 (two) times daily., Disp: 10 capsule, Rfl: 0 .  ondansetron (ZOFRAN ODT) 4 MG disintegrating tablet, Take 1 tablet (4 mg total) by mouth every 8 (eight) hours as needed. (Patient not taking: Reported on 06/06/2020), Disp: 10 tablet, Rfl: 1 .  PENNSAID 2 % SOLN, Apply 1 application topically 2 (two) times daily as needed. To affected areas for pain. (Patient not taking:  Reported on 06/06/2020), Disp: , Rfl: 0 Allergies  Allergen Reactions  . No Known Allergies     Assessment & Plan:   1. Lower urinary tract symptoms   No red flags on discussion. Symptoms are typical for patient. Discussed risks of not having a urine culture to assess susceptibility however she accepts this risk -- including, but not limited to, worsening infection. We were going to start Keflex but she declined this antibiotic and states that it causes yeast infections. Will start macrobid. If symptoms progress or do not improve in 1-2 days, she was instructed to seek in-person care here or at urgent care.   No orders of the defined types were placed in this encounter.  Meds ordered this encounter  Medications  . nitrofurantoin, macrocrystal-monohydrate, (MACROBID) 100 MG capsule    Sig: Take 1 capsule (100 mg total) by mouth 2 (two) times daily.    Dispense:  10 capsule    Refill:  0    Order Specific Question:   Supervising Provider    Answer:   Martha Orr    Martha Coke, PA 06/16/2020  Time spent with the patient: 8 minutes, spent in obtaining information about her symptoms, reviewing her previous labs, evaluations, and treatments, counseling her about her condition (please see the discussed topics above), and developing a plan to further investigate it; she had a number of questions which I addressed.

## 2020-06-16 NOTE — Telephone Encounter (Signed)
Patient husband call stating Mrs Martha Orr was at the ER on 06/05/2020 had a lung Xray and was notice a spot on her lung. Husband wanted if possible to compare to the Xray she had done on May. 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 Please advise. (requesting appt for Thursday or Friday if needed)

## 2020-06-16 NOTE — Telephone Encounter (Signed)
Pt schedule appointment for today  °

## 2020-08-06 ENCOUNTER — Ambulatory Visit: Payer: Medicare Other | Admitting: Family Medicine

## 2021-02-06 ENCOUNTER — Ambulatory Visit (INDEPENDENT_AMBULATORY_CARE_PROVIDER_SITE_OTHER): Payer: Medicare Other | Admitting: Family Medicine

## 2021-02-06 ENCOUNTER — Encounter: Payer: Medicare Other | Admitting: Family Medicine

## 2021-02-06 ENCOUNTER — Encounter: Payer: Self-pay | Admitting: Family Medicine

## 2021-02-06 ENCOUNTER — Other Ambulatory Visit: Payer: Self-pay

## 2021-02-06 VITALS — BP 129/82 | HR 73 | Temp 97.2°F | Ht 66.5 in | Wt 187.6 lb

## 2021-02-06 DIAGNOSIS — Z0000 Encounter for general adult medical examination without abnormal findings: Secondary | ICD-10-CM

## 2021-02-06 DIAGNOSIS — I10 Essential (primary) hypertension: Secondary | ICD-10-CM | POA: Insufficient documentation

## 2021-02-06 DIAGNOSIS — R739 Hyperglycemia, unspecified: Secondary | ICD-10-CM | POA: Diagnosis not present

## 2021-02-06 DIAGNOSIS — F419 Anxiety disorder, unspecified: Secondary | ICD-10-CM | POA: Diagnosis not present

## 2021-02-06 DIAGNOSIS — R94120 Abnormal auditory function study: Secondary | ICD-10-CM

## 2021-02-06 DIAGNOSIS — E785 Hyperlipidemia, unspecified: Secondary | ICD-10-CM

## 2021-02-06 DIAGNOSIS — R918 Other nonspecific abnormal finding of lung field: Secondary | ICD-10-CM | POA: Diagnosis not present

## 2021-02-06 DIAGNOSIS — F325 Major depressive disorder, single episode, in full remission: Secondary | ICD-10-CM

## 2021-02-06 DIAGNOSIS — R030 Elevated blood-pressure reading, without diagnosis of hypertension: Secondary | ICD-10-CM

## 2021-02-06 DIAGNOSIS — E039 Hypothyroidism, unspecified: Secondary | ICD-10-CM | POA: Diagnosis not present

## 2021-02-06 DIAGNOSIS — Z0001 Encounter for general adult medical examination with abnormal findings: Secondary | ICD-10-CM | POA: Diagnosis not present

## 2021-02-06 DIAGNOSIS — Z1211 Encounter for screening for malignant neoplasm of colon: Secondary | ICD-10-CM

## 2021-02-06 LAB — COMPREHENSIVE METABOLIC PANEL
ALT: 26 U/L (ref 0–35)
AST: 23 U/L (ref 0–37)
Albumin: 4.2 g/dL (ref 3.5–5.2)
Alkaline Phosphatase: 42 U/L (ref 39–117)
BUN: 11 mg/dL (ref 6–23)
CO2: 30 mEq/L (ref 19–32)
Calcium: 9.1 mg/dL (ref 8.4–10.5)
Chloride: 103 mEq/L (ref 96–112)
Creatinine, Ser: 0.7 mg/dL (ref 0.40–1.20)
GFR: 89.89 mL/min (ref >60.00)
Glucose, Bld: 93 mg/dL (ref 70–99)
Potassium: 4.3 mEq/L (ref 3.5–5.1)
Sodium: 141 mEq/L (ref 135–145)
Total Bilirubin: 0.5 mg/dL (ref 0.2–1.2)
Total Protein: 7.2 g/dL (ref 6.0–8.3)

## 2021-02-06 LAB — LIPID PANEL
Cholesterol: 202 mg/dL — ABNORMAL HIGH (ref 0–200)
HDL: 53.6 mg/dL (ref >39.00)
NonHDL: 148.39
Total CHOL/HDL Ratio: 4
Triglycerides: 368 mg/dL — ABNORMAL HIGH (ref 0.0–149.0)
VLDL: 73.6 mg/dL — ABNORMAL HIGH (ref 0.0–40.0)

## 2021-02-06 LAB — CBC
HCT: 38.7 % (ref 36.0–46.0)
Hemoglobin: 13.4 g/dL (ref 12.0–15.0)
MCHC: 34.7 g/dL (ref 30.0–36.0)
MCV: 89.7 fl (ref 78.0–100.0)
Platelets: 213 10*3/uL (ref 150.0–400.0)
RBC: 4.32 Mil/uL (ref 3.87–5.11)
RDW: 13.4 % (ref 11.5–15.5)
WBC: 4.1 10*3/uL (ref 4.0–10.5)

## 2021-02-06 LAB — LDL CHOLESTEROL, DIRECT: Direct LDL: 85 mg/dL

## 2021-02-06 LAB — TSH: TSH: 2.89 u[IU]/mL (ref 0.35–4.50)

## 2021-02-06 MED ORDER — PREDNISONE 50 MG PO TABS: ORAL_TABLET | ORAL | 0 refills | Status: DC

## 2021-02-06 NOTE — Assessment & Plan Note (Signed)
Check A1c today.

## 2021-02-06 NOTE — Assessment & Plan Note (Signed)
Stable on Lexapro 20 mg daily. 

## 2021-02-06 NOTE — Addendum Note (Signed)
Addended by: Vivi Barrack on: 02/06/2021 12:20 PM   Modules accepted: Level of Service

## 2021-02-06 NOTE — Progress Notes (Addendum)
Chief Complaint:  Martha Orr is a 65 y.o. female who presents today for an Initial Medicare Wellness visit and her annual comprehensive physical exam.   Assessment/Plan:  New/Acute Problems: Headache/Sinus Congestion Patient with evidence of sinusitis and eustachian tube dysfunction.  No signs of infection based on her exam today.  We will try prednisone burst.  She has been using over-the-counter allergy meds with no significant improvement.  She will let me know if not improving  Diarrhea Secondary to status post cholecystectomy.  Recommend increased fiber intake.  Decreased Hearing Failed hearing screen. Will place referral to audiology.   Chronic Problems Addressed Today: Hyperglycemia Check A1c today.  Elevated blood pressure reading Blood pressure here today at goal.  Continue home monitoring goal 140/90 or lower.  Pulmonary nodules Due for repeat chest CT.  Will place order and have we will be able to get scheduled soon.  Hypothyroidism Check TSH.  Continue Synthroid 112 mcg daily.  Dyslipidemia She is on Crestor 20 mg daily.  We will check lipids today.  Anxiety Continue Lexapro 20 mg daily.  Symptoms are stable.  Major depressive disorder, single episode, in remission ([facility]) Stable on Lexapro 20 mg daily.   Preventative Healthcare Health Maintenance  Topic Date Due  . Hepatitis C Screening  Never done  . TETANUS/TDAP  Never done  . COLONOSCOPY (Pts 45-72yrs Insurance coverage will need to be confirmed)  Never done  . MAMMOGRAM  12/04/2020  . COVID-19 Vaccine (1) 02/22/2021 (Originally 04/24/1961)  . INFLUENZA VACCINE  03/26/2021 (Originally 07/27/2020)  . PNA vac Low Risk Adult (1 of 2 - PCV13) 02/06/2022 (Originally 04/24/2019)  . DEXA SCAN  Completed    Patient Counseling(The following topics were reviewed and/or handout was given):  -Nutrition: Stressed importance of moderation in sodium/caffeine intake, saturated fat and cholesterol, caloric  balance, sufficient intake of fresh fruits, vegetables, and fiber.  -Stressed the importance of regular exercise.   -Substance Abuse: Discussed cessation/primary prevention of tobacco, alcohol, or other drug use; driving or other dangerous activities under the influence; availability of treatment for abuse.   -Injury prevention: Discussed safety belts, safety helmets, smoke detector, smoking near bedding or upholstery.   -Sexuality: Discussed sexually transmitted diseases, partner selection, use of condoms, avoidance of unintended pregnancy and contraceptive alternatives.   -Dental health: Discussed importance of regular tooth brushing, flossing, and dental visits.  -Health maintenance and immunizations reviewed. Please refer to Health maintenance section.  Advance Directives were discussed with the patient.   Patient Instructions (the written plan) was given to the patient.  Medicare Attestation I have personally reviewed: The patient's medical and social history Their use of alcohol, tobacco or illicit drugs Their current medications and supplements The patient's functional ability including ADLs,fall risks, home safety risks, cognitive, and hearing and visual impairment Diet and physical activities Evidence for depression or mood disorders  Medications reviewed She is not currently on any opioids.   The patient's weight, height, BMI, and visual acuity have been recorded in the chart.  I have made referrals, counseling, and provided education to the patient based on review of the above and I have provided the patient with a written personalized care plan for preventive services.    During the course of the visit the patient was educated and counseled about appropriate screening and preventive services including:        Pneumococcal vaccine  Influenza vaccine Screening electrocardiogram Screening mammography Colorectal cancer screening Diabetes screening Nutrition counseling   Advanced directives:  has NO advanced directive  - add't info requested. Referral to SW: no Patient declined Covid, flu, and pneumonia vaccine.  She is due for colon cancer screening and will place order for Cologuard.  She had mammogram done a few months ago.  We will obtain these records.  Will check labs today including CBC, CMET, TSH, and lipid panel.  Discussed advanced directives.    Subjective:  HPI:  She is here today for her annual comprehensive exam and initial welcome to Medicare visit.  She is overall doing well.  See A/P for status of chronic conditions.  She has been having more headaches for the last month.  Associated with some ear pressure and sinus congestion.  She is also had a lot more runny nose.  She has tried using over-the-counter Flonase and Allegra with modest improvement.  Tylenol does not seem to help.  No reported weakness or numbness.  Symptoms are overall stable.  Activities of Daily Living: He has no difficulty performing the following: . Preparing food and eating . Bathing  . Getting dressed . Using the toilet . Shopping . Managing Finances . Moving around from place to place  Fall Screening: She has not had any falls within the past year.   Hearing Screening: Patient has no concerns over hearing.  Safety Screening Patient feels safe at home.  There are no smokers at home.   Depression Screening: Depression screen [facility] 2/9 02/06/2021  Decreased Interest 1  Down, Depressed, Hopeless 1  PHQ - 2 Score 2  Altered sleeping 1  Tired, decreased energy 0  Change in appetite 0  Feeling bad or failure about yourself  0  Trouble concentrating 0  Moving slowly or fidgety/restless 0  Suicidal thoughts 0  PHQ-9 Score 3  Difficult doing work/chores Not difficult at all   Lifestyle Factors: Diet: Cutting back. Exercise: Limited.   Patient Care Team: Vivi Barrack, MD as PCP - General (Family Medicine) Jettie Booze, MD as PCP - Cardiology  (Cardiology) Jamey Reas, MD as Consulting Physician (Obstetrics and Gynecology) Normand Sloop, MD as Consulting Physician (Endocrinology) Sandford Craze, MD as Referring Physician (Dermatology) Melrose Nakayama, MD as Consulting Physician (Orthopedic Surgery) Quitman Livings, DDS as Consulting Physician (Dentistry) Aletha Halim, PT as Consulting Physician (Physical Therapy) Normajean Glasgow, MD as Attending Physician (Physical Medicine and Rehabilitation)   ROS: Per HPI  PMH:  The following were reviewed and entered/updated in epic: Past Medical History:  Diagnosis Date  . Arthritis   . Chronic back pain    buldging disc  . Depression    takes Lexapro daily  . Family history of adverse reaction to anesthesia    sister gets sick after anesthesia  . High cholesterol    takes Crestor daily  . History of colon polyps    benign  . History of shingles   . Hypothyroidism    takes Synthroid daily  . Joint pain   . Joint swelling   . Microscopic hematuria    states her entire life and family is the same way   Past Surgical History:  Procedure Laterality Date  . ABDOMINAL HYSTERECTOMY  1998  . APPENDECTOMY    . BREAST BIOPSY    . BUNIONECTOMY Bilateral   . CARPAL TUNNEL RELEASE Bilateral   . CHOLECYSTECTOMY    . COLONOSCOPY    . KNEE ARTHROSCOPY Right   . KNEE ARTHROSCOPY Left 11/01/2017   Procedure: ARTHROSCOPY LEFT KNEE;  Surgeon: Melrose Nakayama, MD;  Location: [facility]  OR;  Service: Orthopedics;  Laterality: Left;  . knot removed from right breast    . NASAL SINUS SURGERY    . TONSILLECTOMY    . TONSILLECTOMY AND ADENOIDECTOMY  2002  . TOTAL KNEE ARTHROPLASTY Bilateral 10/12/2016   Procedure: TOTAL KNEE BILATERAL;  Surgeon: Melrose Nakayama, MD;  Location: [facility];  Service: Orthopedics;  Laterality: Bilateral;   Family History  Problem Relation Age of Onset  . Heart attack Mother   . Hyperlipidemia Mother   . Arthritis Father   . Arthritis Sister   . Arthritis Sister   .  Heart attack Sister   . Arthritis Sister   . Hyperlipidemia Sister   . Stroke Sister   . Cancer Brother   . Depression Brother   . Heart attack Brother   . Heart disease Brother   . Hypertension Brother   . Hyperlipidemia Brother   . Cancer Brother   . Heart attack Brother     Medications- reviewed and updated Current Outpatient Medications  Medication Sig Dispense Refill  . acyclovir (ZOVIRAX) 400 MG tablet     . amoxicillin (AMOXIL) 500 MG capsule TAKE FOUR CAPSULES BY MOUTH ONE HOUR BEFORE APPOINTMENT    . aspirin EC 81 MG tablet Take 81 mg by mouth daily.    . Biotin w/ Vitamins C & E (HAIR/SKIN/NAILS PO) Take by mouth.    . Calcium Carbonate-Vit D-Min (CALCIUM 600+D3 PLUS MINERALS PO) Take 1 tablet by mouth at bedtime.    . cholecalciferol (VITAMIN D) 1000 units tablet Take 1,000 Units by mouth at bedtime.    Marland Kitchen escitalopram (LEXAPRO) 20 MG tablet Take 1 tablet (20 mg total) by mouth daily. 90 tablet 3  . Ginkgo Biloba 40 MG TABS Take by mouth.    . levothyroxine (SYNTHROID) 112 MCG tablet Take 1 tablet (112 mcg total) by mouth at bedtime. 90 tablet 3  . PENNSAID 2 % SOLN Apply 1 application topically 2 (two) times daily as needed. To affected areas for pain.  0  . predniSONE (DELTASONE) 50 MG tablet Take 1 tablet daily for 5 days. 5 tablet 0  . rosuvastatin (CRESTOR) 20 MG tablet Take 1 tablet (20 mg total) by mouth at bedtime. 90 tablet 3  . metoprolol tartrate (LOPRESSOR) 100 MG tablet Take 1 tablet by mouth 2 hours prior to Cardiac CT (Patient not taking: No sig reported) 1 tablet 0   No current facility-administered medications for this visit.    Allergies-reviewed and updated Allergies  Allergen Reactions  . No Known Allergies     Social History   Socioeconomic History  . Marital status: Married    Spouse name: Not on file  . Number of children: Not on file  . Years of education: Not on file  . Highest education level: Not on file  Occupational History  .  Not on file  Tobacco Use  . Smoking status: Never Smoker  . Smokeless tobacco: Never Used  Vaping Use  . Vaping Use: Never used  Substance and Sexual Activity  . Alcohol use: Yes    Comment: occasionally  . Drug use: No  . Sexual activity: Not on file  Other Topics Concern  . Not on file  Social History Narrative  . Not on file   Social Determinants of Health   Financial Resource Strain: Not on file  Food Insecurity: Not on file  Transportation Needs: Not on file  Physical Activity: Not on file  Stress: Not on file  Social  Connections: Not on file         Objective/Observations  Physical Exam: BP 129/82   Pulse 73   Temp (!) 97.2 F (36.2 C) (Temporal)   Ht 5' 6.5" (1.689 m)   Wt 187 lb 9.6 oz (85.1 kg)   BMI 29.83 kg/m    Hearing Screening   Method: Audiometry   125Hz  250Hz  500Hz  1000Hz  2000Hz  3000Hz  4000Hz  6000Hz  8000Hz   Right ear:           Left ear:           Comments: Fail bilateral     Visual Acuity Screening   Right eye Left eye Both eyes  Without correction: 20/20 20/25 20/20   With correction:      Gen: NAD, resting comfortably HEENT: Bilateral TMs with clear effusion.  Oropharynx erythematous with no exudate CV: RRR with no murmurs appreciated Pulm: NWOB, CTAB with no crackles, wheezes, or rhonchi GI: Normal bowel sounds present. Soft, Nontender, Nondistended. MSK: no edema, cyanosis, or clubbing noted Skin: warm, dry Neuro: grossly normal, moves all extremities Psych: Normal affect and thought content. Normal minicog.   EKG: NSR. Inverted T waves in V3-V5 similar to previous EKGs.       Algis Greenhouse. [name], MD 02/06/2021 10:57 AM

## 2021-02-06 NOTE — Assessment & Plan Note (Signed)
Due for repeat chest CT.  Will place order and have we will be able to get scheduled soon.

## 2021-02-06 NOTE — Assessment & Plan Note (Signed)
Check TSH.  Continue Synthroid 112 mcg daily. 

## 2021-02-06 NOTE — Assessment & Plan Note (Signed)
Blood pressure here today at goal.  Continue home monitoring goal 140/90 or lower.

## 2021-02-06 NOTE — Assessment & Plan Note (Signed)
Continue Lexapro 20 mg daily.  Symptoms are stable.

## 2021-02-06 NOTE — Patient Instructions (Addendum)
It was very nice to see you today!  Please start prednisone.  This should help with your sinus congestion or headaches.  We will get you set up to have a CT scan of your lungs.  We will check blood work today.  I will also place an order for Cologuard.  Please try taking a fiber supplement to help with the diarrhea.  Please take a look at the advance directives hand out.   I will see you back in a year.  Please come back to see me sooner if needed.  Take care, Dr [name]  Please try these tips to maintain a healthy lifestyle:   Eat at least 3 REAL meals and 1-2 snacks per day.  Aim for no more than 5 hours between eating.  If you eat breakfast, please do so within one hour of getting up.    Each meal should contain half fruits/vegetables, one quarter protein, and one quarter carbs (no bigger than a computer mouse)   Cut down on sweet beverages. This includes juice, soda, and sweet tea.     Drink at least 1 glass of water with each meal and aim for at least 8 glasses per day   Exercise at least 150 minutes every week.    Preventive Care 65 Years and Older, Female Preventive care refers to lifestyle choices and visits with your health care provider that can promote health and wellness. This includes:  A yearly physical exam. This is also called an annual wellness visit.  Regular dental and eye exams.  Immunizations.  Screening for certain conditions.  Healthy lifestyle choices, such as: ? Eating a healthy diet. ? Getting regular exercise. ? Not using drugs or products that contain nicotine and tobacco. ? Limiting alcohol use. What can I expect for my preventive care visit? Physical exam Your health care provider will check your:  Height and weight. These may be used to calculate your BMI (body mass index). BMI is a measurement that tells if you are at a healthy weight.  Heart rate and blood pressure.  Body temperature.  Skin for abnormal  spots. Counseling Your health care provider may ask you questions about your:  Past medical problems.  Family's medical history.  Alcohol, tobacco, and drug use.  Emotional well-being.  Home life and relationship well-being.  Sexual activity.  Diet, exercise, and sleep habits.  History of falls.  Memory and ability to understand (cognition).  Work and work Statistician.  Pregnancy and menstrual history.  Access to firearms. What immunizations do I need? Vaccines are usually given at various ages, according to a schedule. Your health care provider will recommend vaccines for you based on your age, medical history, and lifestyle or other factors, such as travel or where you work.   What tests do I need? Blood tests  Lipid and cholesterol levels. These may be checked every 5 years, or more often depending on your overall health.  Hepatitis C test.  Hepatitis B test. Screening  Lung cancer screening. You may have this screening every year starting at age 85 if you have a 30-pack-year history of smoking and currently smoke or have quit within the past 15 years.  Colorectal cancer screening. ? All adults should have this screening starting at age 62 and continuing until age 56. ? Your health care provider may recommend screening at age 11 if you are at increased risk. ? You will have tests every 1-10 years, depending on your results and the type of  screening test.  Diabetes screening. ? This is done by checking your blood sugar (glucose) after you have not eaten for a while (fasting). ? You may have this done every 1-3 years.  Mammogram. ? This may be done every 1-2 years. ? Talk with your health care provider about how often you should have regular mammograms.  Abdominal aortic aneurysm (AAA) screening. You may need this if you are a current or former smoker.  BRCA-related cancer screening. This may be done if you have a family history of breast, ovarian, tubal, or  peritoneal cancers. Other tests  STD (sexually transmitted disease) testing, if you are at risk.  Bone density scan. This is done to screen for osteoporosis. You may have this done starting at age 27. Talk with your health care provider about your test results, treatment options, and if necessary, the need for more tests. Follow these instructions at home: Eating and drinking  Eat a diet that includes fresh fruits and vegetables, whole grains, lean protein, and low-fat dairy products. Limit your intake of foods with high amounts of sugar, saturated fats, and salt.  Take vitamin and mineral supplements as recommended by your health care provider.  Do not drink alcohol if your health care provider tells you not to drink.  If you drink alcohol: ? Limit how much you have to 0-1 drink a day. ? Be aware of how much alcohol is in your drink. In the [place] one drink equals one 12 oz bottle of beer (355 mL), one 5 oz glass of wine (148 mL), or one 1 oz glass of hard liquor (44 mL).   Lifestyle  Take daily care of your teeth and gums. Brush your teeth every morning and night with fluoride toothpaste. Floss one time each day.  Stay active. Exercise for at least 30 minutes 5 or more days each week.  Do not use any products that contain nicotine or tobacco, such as cigarettes, e-cigarettes, and chewing tobacco. If you need help quitting, ask your health care provider.  Do not use drugs.  If you are sexually active, practice safe sex. Use a condom or other form of protection in order to prevent STIs (sexually transmitted infections).  Talk with your health care provider about taking a low-dose aspirin or statin.  Find healthy ways to cope with stress, such as: ? Meditation, yoga, or listening to music. ? Journaling. ? Talking to a trusted person. ? Spending time with friends and family. Safety  Always wear your seat belt while driving or riding in a vehicle.  Do not drive: ? If you have  been drinking alcohol. Do not ride with someone who has been drinking. ? When you are tired or distracted. ? While texting.  Wear a helmet and other protective equipment during sports activities.  If you have firearms in your house, make sure you follow all gun safety procedures. What's next?  Visit your health care provider once a year for an annual wellness visit.  Ask your health care provider how often you should have your eyes and teeth checked.  Stay up to date on all vaccines. This information is not intended to replace advice given to you by your health care provider. Make sure you discuss any questions you have with your health care provider. Document Revised: 12/03/2020 Document Reviewed: 12/07/2018 Elsevier Patient Education  2021 [place]

## 2021-02-06 NOTE — Assessment & Plan Note (Signed)
She is on Crestor 20 mg daily.  We will check lipids today.

## 2021-02-09 LAB — HEMOGLOBIN A1C: Hgb A1c MFr Bld: 5.9 % (ref 4.6–6.5)

## 2021-02-09 NOTE — Progress Notes (Signed)
Please inform patient of the following:  Cholesterol and blood sugar are borderline but stable. Do not need to make any changes to her treatment plan at this time. We can recheck in a year.  Algis Greenhouse. [name], MD 02/09/2021 10:39 AM

## 2021-02-13 ENCOUNTER — Ambulatory Visit: Payer: Medicare Other

## 2021-02-13 ENCOUNTER — Telehealth: Payer: Self-pay

## 2021-02-13 ENCOUNTER — Other Ambulatory Visit: Payer: Self-pay | Admitting: *Deleted

## 2021-02-13 DIAGNOSIS — H9190 Unspecified hearing loss, unspecified ear: Secondary | ICD-10-CM

## 2021-02-13 NOTE — Telephone Encounter (Signed)
Pt wants to be referred to an ENT, not just a " hearing Dr."

## 2021-02-13 NOTE — Telephone Encounter (Signed)
ENT referral placed.

## 2021-02-13 NOTE — Telephone Encounter (Signed)
Ok with me. Please place any necessary orders. 

## 2021-02-13 NOTE — Telephone Encounter (Signed)
Ok to placed referral  

## 2021-02-19 LAB — COLOGUARD: Cologuard: NEGATIVE

## 2021-02-27 ENCOUNTER — Encounter: Payer: Self-pay | Admitting: Family Medicine

## 2021-03-06 ENCOUNTER — Other Ambulatory Visit: Payer: Self-pay | Admitting: Otolaryngology

## 2021-03-06 ENCOUNTER — Other Ambulatory Visit (HOSPITAL_COMMUNITY): Payer: Self-pay | Admitting: Otolaryngology

## 2021-03-06 DIAGNOSIS — H9042 Sensorineural hearing loss, unilateral, left ear, with unrestricted hearing on the contralateral side: Secondary | ICD-10-CM

## 2021-03-10 ENCOUNTER — Other Ambulatory Visit: Payer: Self-pay | Admitting: Family Medicine

## 2021-03-11 ENCOUNTER — Other Ambulatory Visit: Payer: Self-pay | Admitting: Physical Medicine and Rehabilitation

## 2021-03-11 DIAGNOSIS — M5136 Other intervertebral disc degeneration, lumbar region: Secondary | ICD-10-CM

## 2021-03-18 ENCOUNTER — Ambulatory Visit (HOSPITAL_COMMUNITY): Payer: Medicare Other

## 2021-03-25 ENCOUNTER — Ambulatory Visit (HOSPITAL_COMMUNITY)
Admission: RE | Admit: 2021-03-25 | Discharge: 2021-03-25 | Disposition: A | Discharge status: Home / Self Care | Payer: Medicare Other | Source: Ambulatory Visit | Attending: Otolaryngology | Admitting: Otolaryngology

## 2021-03-25 ENCOUNTER — Ambulatory Visit (HOSPITAL_COMMUNITY)
Admission: RE | Admit: 2021-03-25 | Discharge: 2021-03-25 | Disposition: A | Discharge status: Home / Self Care | Payer: Medicare Other | Source: Ambulatory Visit | Attending: Physical Medicine and Rehabilitation | Admitting: Physical Medicine and Rehabilitation

## 2021-03-25 DIAGNOSIS — H9042 Sensorineural hearing loss, unilateral, left ear, with unrestricted hearing on the contralateral side: Secondary | ICD-10-CM

## 2021-03-25 DIAGNOSIS — M5136 Other intervertebral disc degeneration, lumbar region: Secondary | ICD-10-CM | POA: Diagnosis not present

## 2021-03-25 MED ORDER — GADOBUTROL 1 MMOL/ML IV SOLN
7.5000 mL | Freq: Once | INTRAVENOUS | Status: AC | PRN
  Administered 2021-03-25: 7.5 mL via INTRAVENOUS

## 2021-04-08 ENCOUNTER — Telehealth: Payer: Self-pay

## 2021-04-08 NOTE — Telephone Encounter (Signed)
Received a call this afternoon from [place], he states that Martha Orr has been monitoring her blood pressure recently and has noticed it was a bit higher than normal but not concering. When going to [facility] this morning her BP was 162/96 and is currently symptomatic with worsening headaches and on/off SOB. Did advise husband that I was going to have [facility] give her a call back shortly and that I was sending a message to Dr.Parker's team.

## 2021-04-09 ENCOUNTER — Telehealth: Payer: Self-pay

## 2021-04-09 NOTE — Telephone Encounter (Signed)
FYI

## 2021-04-09 NOTE — Telephone Encounter (Signed)
Initial Comment Caller states that the patient has been having high blood pressure, sporadic shortness of breath, headaches and her BP is registering at 162/96. Phone numbers listed are for patient and husband. Translation No Disp. Time Eilene Ghazi Time) Disposition Final User 04/08/2021 1:10:41 PM Send to Urgent Queue Wynema Birch 04/08/2021 1:13:15 PM Attempt made - message left Claiborne Billings, RN, Maudie Mercury 04/08/2021 1:17:26 PM Attempt made - message left Claiborne Billings, RN, [place] 04/08/2021 1:23:02 PM FINAL ATTEMPT MADE - message left Yes Claiborne Billings, RN, [place]

## 2021-04-10 ENCOUNTER — Emergency Department (HOSPITAL_COMMUNITY)
Admission: EM | Admit: 2021-04-10 | Discharge: 2021-04-10 | Disposition: A | Discharge status: Home / Self Care | Payer: Medicare Other | Attending: Emergency Medicine | Admitting: Emergency Medicine

## 2021-04-10 ENCOUNTER — Encounter (HOSPITAL_COMMUNITY): Payer: Self-pay | Admitting: Emergency Medicine

## 2021-04-10 ENCOUNTER — Other Ambulatory Visit: Payer: Self-pay

## 2021-04-10 DIAGNOSIS — I10 Essential (primary) hypertension: Secondary | ICD-10-CM | POA: Diagnosis not present

## 2021-04-10 DIAGNOSIS — Z96653 Presence of artificial knee joint, bilateral: Secondary | ICD-10-CM | POA: Diagnosis not present

## 2021-04-10 DIAGNOSIS — E039 Hypothyroidism, unspecified: Secondary | ICD-10-CM | POA: Diagnosis not present

## 2021-04-10 DIAGNOSIS — Z79899 Other long term (current) drug therapy: Secondary | ICD-10-CM | POA: Diagnosis not present

## 2021-04-10 DIAGNOSIS — R519 Headache, unspecified: Secondary | ICD-10-CM

## 2021-04-10 DIAGNOSIS — Z7982 Long term (current) use of aspirin: Secondary | ICD-10-CM | POA: Insufficient documentation

## 2021-04-10 MED ORDER — DIPHENHYDRAMINE HCL 50 MG/ML IJ SOLN
50.0000 mg | Freq: Once | INTRAMUSCULAR | Status: AC
  Administered 2021-04-10: 50 mg via INTRAVENOUS
  Filled 2021-04-10: qty 1

## 2021-04-10 MED ORDER — METOCLOPRAMIDE HCL 5 MG/ML IJ SOLN
10.0000 mg | Freq: Once | INTRAMUSCULAR | Status: AC
  Administered 2021-04-10: 10 mg via INTRAVENOUS
  Filled 2021-04-10: qty 2

## 2021-04-10 MED ORDER — SODIUM CHLORIDE 0.9 % IV BOLUS
1000.0000 mL | Freq: Once | INTRAVENOUS | Status: AC
  Administered 2021-04-10: 1000 mL via INTRAVENOUS

## 2021-04-10 NOTE — ED Triage Notes (Signed)
Pt c/o hypertension and a HA for the past few weeks.

## 2021-04-10 NOTE — Discharge Instructions (Addendum)
You came to the emerge department today to be evaluated for your headache and high blood pressure.  Physical exam was reassuring.  Headache improved after receiving medications.    Please follow-up with your primary care provider to have your blood pressure reassessed.  They can start you on medication for hypertension if necessary.  Get help right away if: Your headache becomes severe quickly. Your headache gets worse after moderate to intense physical activity. You have repeated vomiting. You have a stiff neck. You have a loss of vision. You have problems with speech. You have pain in the eye or ear. You have muscular weakness or loss of muscle control. You lose your balance or have trouble walking. You feel faint or pass out. You have confusion. You have a seizure.

## 2021-04-10 NOTE — ED Triage Notes (Signed)
Emergency Medicine Provider Triage Evaluation Note  Martha Orr , a 65 y.o. female  was evaluated in triage.  Pt complains of a HA for the last month. States that the headache has been constant. HA moves around and intermittently is noted to the frontal aspect of the head or posteriorly.   States since she started having the headaches she has been checking her BP at home and Bps have been elevated at 150/110 and 145/100.   Denies vision changes or unilateral numbness/weakness.  Review of Systems  Positive: HA, elevated BP Negative: Vision changes, unilateral numbness/weakness  Physical Exam  BP (!) 138/96 (BP Location: Right Arm)   Pulse 86   Temp 98.2 F (36.8 C) (Oral)   Resp 16   Ht 5\' 6"  (1.676 m)   Wt 84.4 kg   SpO2 100%   BMI 30.02 kg/m  Gen:   Awake, no distress   HEENT:  Atraumatic  Resp:  Normal effort  Cardiac:  Normal rate  Abd:   Nondistended Neuro:  Speech clear   Medical Decision Making  Medically screening exam initiated at 1:24 PM.  Appropriate orders placed.  HADAS JESSOP was informed that the remainder of the evaluation will be completed by another provider, this initial triage assessment does not replace that evaluation, and the importance of remaining in the ED until their evaluation is complete.  Clinical Impression    65 y/o F presenting for eval of HA. Since sxs started she been monitoring Bps and they have been elevated at home in the 140s to 150s.   MSE was initiated and I personally evaluated the patient and placed orders (if any) at  1:24 PM on April 10, 2021.  The patient appears stable so that the remainder of the MSE may be completed by another provider.    Rodney Booze, PA-C 04/10/21 1324

## 2021-04-10 NOTE — ED Provider Notes (Signed)
[facility] EMERGENCY DEPARTMENT Provider Note   CSN: [id number] Arrival date & time: 04/10/21  1242     History Chief Complaint  Patient presents with  . Hypertension    Martha Orr is a 65 y.o. female with a history of hypothyroidism, anxiety, depression.  Patient presents with chief complaint of headache and hypertension.  Patient reports that she has had intermittent headaches over the last month.  Reports that today she woke this morning and noticed a headache.  This headache progressively worsened throughout the day.  Pain is located to the aspect of her head.  Patient rates her pain 10/10 on the pain scale.  Patient denies any alleviating or aggravating factors.  Reports that this headache feels similar to previous headaches she has had over the last month.    Patient reports that she has noticed her blood pressure has been elevated over the last month as well.  Patient reports previously her systolic numbers were low 100.  Over the last month her blood pressure has been elevated systolic blood pressure in the 140s or 778E and diastolic in the 423N.  Patient reports she is not on any blood pressure medication.  Patient reports she has not been able to follow-up with her primary care provider about this issue.  Patient denies any visual disturbance, chest pain, shortness of breath, nausea, vomiting, slurred speech, facial asymmetry, numbness or tingling to extremities, weakness to extremities, seizures, syncopal episode.  HPI     Past Medical History:  Diagnosis Date  . Arthritis   . Chronic back pain    buldging disc  . Depression    takes Lexapro daily  . Family history of adverse reaction to anesthesia    sister gets sick after anesthesia  . High cholesterol    takes Crestor daily  . History of colon polyps    benign  . History of shingles   . Hypothyroidism    takes Synthroid daily  . Joint pain   . Joint swelling   . Microscopic hematuria    states her  entire life and family is the same way    Patient Active Problem List   Diagnosis Date Noted  . Elevated blood pressure reading 02/06/2021  . Hyperglycemia 02/06/2021  . Pulmonary nodules 04/23/2020  . Otalgia of both ears 04/23/2020  . Major depressive disorder, single episode, in remission ([place]) 08/02/2019  . Anxiety 08/02/2019  . Dyslipidemia 08/02/2019  . Hypothyroidism 08/02/2019  . 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 Osteoarthritis 08/02/2019  . Restless legs 08/02/2019  . Bilateral primary osteoarthritis of knee 10/12/2016    Past Surgical History:  Procedure Laterality Date  . ABDOMINAL HYSTERECTOMY  1998  . APPENDECTOMY    . BREAST BIOPSY    . BUNIONECTOMY Bilateral   . CARPAL TUNNEL RELEASE Bilateral   . CHOLECYSTECTOMY    . COLONOSCOPY    . KNEE ARTHROSCOPY Right   . KNEE ARTHROSCOPY Left 11/01/2017   Procedure: ARTHROSCOPY LEFT KNEE;  Surgeon: Melrose Nakayama, MD;  Location: [facility];  Service: Orthopedics;  Laterality: Left;  . knot removed from right breast    . NASAL SINUS SURGERY    . TONSILLECTOMY    . TONSILLECTOMY AND ADENOIDECTOMY  2002  . TOTAL KNEE ARTHROPLASTY Bilateral 10/12/2016   Procedure: TOTAL KNEE BILATERAL;  Surgeon: Melrose Nakayama, MD;  Location: [facility];  Service: Orthopedics;  Laterality: Bilateral;     OB History   No obstetric history on file.  Family History  Problem Relation Age of Onset  . Heart attack Mother   . Hyperlipidemia Mother   . Arthritis Father   . Arthritis Sister   . Arthritis Sister   . Heart attack Sister   . Arthritis Sister   . Hyperlipidemia Sister   . Stroke Sister   . Cancer Brother   . Depression Brother   . Heart attack Brother   . Heart disease Brother   . Hypertension Brother   . Hyperlipidemia Brother   . Cancer Brother   . Heart attack Brother     Social History   Tobacco Use  . Smoking status: Never Smoker  . Smokeless tobacco: Never Used  Vaping Use  . Vaping Use: Never used  Substance Use  Topics  . Alcohol use: Yes    Comment: occasionally  . Drug use: No    Home Medications Prior to Admission medications   Medication Sig Start Date End Date Taking? Authorizing Provider  acyclovir (ZOVIRAX) 400 MG tablet  07/05/19   [provider]  amoxicillin (AMOXIL) 500 MG capsule TAKE FOUR CAPSULES BY MOUTH ONE HOUR BEFORE APPOINTMENT 06/21/19   [provider]  aspirin EC 81 MG tablet Take 81 mg by mouth daily.    [provider]  Biotin w/ Vitamins C & E (HAIR/SKIN/NAILS PO) Take by mouth.    [provider]  Calcium Carbonate-Vit D-Min (CALCIUM 600+D3 PLUS MINERALS PO) Take 1 tablet by mouth at bedtime.    [provider]  cholecalciferol (VITAMIN D) 1000 units tablet Take 1,000 Units by mouth at bedtime.    [provider]  escitalopram (LEXAPRO) 20 MG tablet Take 1 tablet (20 mg total) by mouth daily. 04/23/20   Vivi Barrack, MD  Ginkgo Biloba 40 MG TABS Take by mouth.    [provider]  metoprolol tartrate (LOPRESSOR) 100 MG tablet Take 1 tablet by mouth 2 hours prior to Cardiac CT Patient not taking: No sig reported 04/08/20   Jettie Booze, MD  PENNSAID 2 % SOLN Apply 1 application topically 2 (two) times daily as needed. To affected areas for pain. 10/11/17   [provider]  predniSONE (DELTASONE) 50 MG tablet Take 1 tablet daily for 5 days. 02/06/21   Vivi Barrack, MD  rosuvastatin (CRESTOR) 20 MG tablet Take 1 tablet (20 mg total) by mouth at bedtime. 04/23/20   Vivi Barrack, MD  SYNTHROID 112 MCG tablet TAKE 1 TABLET AT BEDTIME 03/11/21   Vivi Barrack, MD    Allergies    No known allergies  Review of Systems   Review of Systems  Constitutional: Negative for chills and fever.  Eyes: Negative for visual disturbance.  Respiratory: Negative for shortness of breath.   Cardiovascular: Negative for chest pain.  Gastrointestinal: Negative for abdominal pain, nausea and vomiting.   Genitourinary: Negative for difficulty urinating, dysuria and hematuria.  Musculoskeletal: Negative for back pain and neck pain.  Skin: Negative for color change and rash.  Neurological: Positive for headaches. Negative for dizziness, tremors, seizures, syncope, facial asymmetry, speech difficulty, light-headedness and numbness.  Psychiatric/Behavioral: Negative for confusion.    Physical Exam Updated Vital Signs BP (!) 156/95   Pulse 78   Temp 98.2 F (36.8 C) (Oral)   Resp 16   Ht 5\' 6"  (1.676 m)   Wt 84.4 kg   SpO2 99%   BMI 30.02 kg/m   Physical Exam Vitals and nursing note reviewed.  Constitutional:  General: She is not in acute distress.    Appearance: She is not ill-appearing, toxic-appearing or diaphoretic.  HENT:     Head: Normocephalic and atraumatic. No raccoon eyes, abrasion, contusion, masses, right periorbital erythema, left periorbital erythema or laceration.     Mouth/Throat:     Pharynx: Oropharynx is clear. Uvula midline. No pharyngeal swelling, oropharyngeal exudate, posterior oropharyngeal erythema or uvula swelling.  Eyes:     General: No scleral icterus.       Right eye: No discharge.        Left eye: No discharge.     Extraocular Movements: Extraocular movements intact.     Pupils: Pupils are equal, round, and reactive to light.  Neck:     Vascular: No carotid bruit.  Cardiovascular:     Rate and Rhythm: Normal rate.     Pulses:          Carotid pulses are 3+ on the right side and 3+ on the left side.      Radial pulses are 3+ on the right side and 3+ on the left side.     Heart sounds: Normal heart sounds.  Pulmonary:     Effort: Pulmonary effort is normal. No tachypnea, bradypnea or respiratory distress.     Breath sounds: Normal breath sounds. No stridor.  Musculoskeletal:     Cervical back: Normal range of motion and neck supple. No rigidity.     Right lower leg: Normal.     Left lower leg: Normal.     Comments: No midline tenderness,  deformity, or step-off to cervical, thoracic, or lumbar spine  Skin:    General: Skin is warm and dry.     Coloration: Skin is not jaundiced or pale.  Neurological:     General: No focal deficit present.     Mental Status: She is alert and oriented to person, place, and time.     GCS: GCS eye subscore is 4. GCS verbal subscore is 5. GCS motor subscore is 6.     Cranial Nerves: No cranial nerve deficit or facial asymmetry.     Sensory: Sensation is intact.     Motor: No weakness, tremor, seizure activity or pronator drift.     Coordination: Romberg sign negative. Finger-Nose-Finger Test normal.     Gait: Gait is intact. Gait normal.     Comments: CN II-XII intact, equal grip strength, +5 strength to bilateral upper and lower extremities     Psychiatric:        Behavior: Behavior is cooperative.     ED Results / Procedures / Treatments   Labs (all labs ordered are listed, but only abnormal results are displayed) Labs Reviewed - No data to display  EKG None  Radiology No results found.  Procedures Procedures   Medications Ordered in ED Medications  metoCLOPramide (REGLAN) injection 10 mg (10 mg Intravenous Given 04/10/21 1541)  diphenhydrAMINE (BENADRYL) injection 50 mg (50 mg Intravenous Given 04/10/21 1542)  sodium chloride 0.9 % bolus 1,000 mL (1,000 mLs Intravenous New Bag/Given 04/10/21 1538)    ED Course  I have reviewed the triage vital signs and the nursing notes.  Pertinent labs & imaging results that were available during my care of the patient were reviewed by me and considered in my medical decision making (see chart for details).    MDM Rules/Calculators/A&P  Alert 65 year old female no acute distress, nontoxic-appearing.  Patient presents with chief complaint of headache and hypertension.  Patient reports that she has had both of these symptoms intermittently over the last month.    Patient reports that she woke up with her headache  this morning, headache has progressively worsened over time.  Headache feels similar to previous episodes she has experienced over the last month.  Denies any nausea, vomiting, visual disturbance, focal neurological deficit.  Physical exam patient has no focal neurological deficits.  Low suspicion for subarachnoid hemorrhage or intracranial abnormality.  Will give patient migraine cocktail and reassess.  On reassessment patient patient reports improvement in her headache rated 5/10 on the pain scale.  Patient continues to have no focal neurological deficits.  Patient blood pressure noted to be 151/84.  We will have patient follow-up with her primary care provider for blood pressure recheck and possible hypertension management.  Discussed results, findings, treatment and follow up. Patient advised of return precautions. Patient verbalized understanding and agreed with plan.   Final Clinical Impression(s) / ED Diagnoses Final diagnoses:  Hypertension, unspecified type  Acute nonintractable headache, unspecified headache type    Rx / DC Orders ED Discharge Orders    None       Loni Beckwith, PA-C 04/10/21 1647    Milton Ferguson, MD 04/14/21 [phone]

## 2021-04-13 ENCOUNTER — Telehealth: Payer: Self-pay

## 2021-04-13 NOTE — Telephone Encounter (Signed)
Nurse Assessment Nurse: Rodney Cruise, RN, Sean Date/Time (Eastern Time): 04/10/2021 10:57:13 AM Confirm and document reason for call. If symptomatic, describe symptoms. ---Caller states high BP 155/100, little headache, some sharp pain in left arm - little bit. Is not on BP medication. Does the patient have any new or worsening symptoms? ---Yes Will a triage be completed? ---Yes Related visit to physician within the last 2 weeks? ---No Does the PT have any chronic conditions? (i.e. diabetes, asthma, this includes High risk factors for pregnancy, etc.) ---Yes List chronic conditions. ---Thyroid concerns, Hyperlipidemia. Is this a behavioral health or substance abuse call? ---No Guidelines Guideline Title Affirmed Question Affirmed Notes Nurse Date/Time (Eastern Time) Blood Pressure - High [0] Systolic BP >= 277 OR Diastolic >= 412 AND [8] cardiac or neurologic symptoms (e.g., chest pain, difficulty Baxter, RN, Hilliard Clark 04/10/2021 10:59:03 AM PLEASE NOTE: All timestamps contained within this report are represented as [place] Standard Time. CONFIDENTIALTY NOTICE: This fax transmission is intended only for the addressee. It contains information that is legally privileged, confidential or otherwise protected from use or disclosure. If you are not the intended recipient, you are strictly prohibited from reviewing, disclosing, copying using or disseminating any of this information or taking any action in reliance on or regarding this information. If you have received this fax in error, please notify us immediately by telephone so that we can arrange for its return to [place]. Phone: [phone], Toll-Free: [phone], Fax: [phone] Page: 2 of 2 Call Id: [id number] Guidelines Guideline Title Affirmed Question Affirmed Notes Nurse Date/Time Eilene Ghazi Time) breathing, unsteady gait, blurred vision) Disp. Time Eilene Ghazi Time) Disposition Final User 04/10/2021 11:02:27 AM Go to ED Now Yes Baxter,  RN, Lillia Dallas Disagree/Comply Comply Caller Understands Yes PreDisposition InappropriateToAsk Care Advice Given Per Guideline GO TO ED NOW: NOTE TO TRIAGER - DRIVING: * Another adult should drive. CARE ADVICE given per High Blood Pressure (Adult) guideline. Referrals GO TO FACILITY OTHER - SPECIFY

## 2021-04-14 ENCOUNTER — Other Ambulatory Visit: Payer: Self-pay

## 2021-04-14 ENCOUNTER — Encounter: Payer: Self-pay | Admitting: Family Medicine

## 2021-04-14 ENCOUNTER — Ambulatory Visit: Payer: Medicare Other | Admitting: Family Medicine

## 2021-04-14 VITALS — BP 155/89 | HR 95 | Temp 98.2°F | Ht 66.0 in | Wt 190.2 lb

## 2021-04-14 DIAGNOSIS — R030 Elevated blood-pressure reading, without diagnosis of hypertension: Secondary | ICD-10-CM

## 2021-04-14 DIAGNOSIS — I10 Essential (primary) hypertension: Secondary | ICD-10-CM

## 2021-04-14 DIAGNOSIS — R519 Headache, unspecified: Secondary | ICD-10-CM

## 2021-04-14 LAB — URINALYSIS, ROUTINE W REFLEX MICROSCOPIC
Bilirubin Urine: NEGATIVE
Ketones, ur: NEGATIVE
Leukocytes,Ua: NEGATIVE
Nitrite: NEGATIVE
Specific Gravity, Urine: >=1.03 — AB (ref 1.000–1.030)
Total Protein, Urine: NEGATIVE
Urine Glucose: NEGATIVE
Urobilinogen, UA: 0.2 (ref 0.0–1.0)
WBC, UA: NONE SEEN (ref 0–?)
pH: 5.5 (ref 5.0–8.0)

## 2021-04-14 LAB — COMPREHENSIVE METABOLIC PANEL
ALT: 25 U/L (ref 0–35)
AST: 21 U/L (ref 0–37)
Albumin: 4.1 g/dL (ref 3.5–5.2)
Alkaline Phosphatase: 41 U/L (ref 39–117)
BUN: 9 mg/dL (ref 6–23)
CO2: 28 mEq/L (ref 19–32)
Calcium: 9.1 mg/dL (ref 8.4–10.5)
Chloride: 105 mEq/L (ref 96–112)
Creatinine, Ser: 0.71 mg/dL (ref 0.40–1.20)
GFR: 88.26 mL/min (ref >60.00)
Glucose, Bld: 102 mg/dL — ABNORMAL HIGH (ref 70–99)
Potassium: 3.7 mEq/L (ref 3.5–5.1)
Sodium: 141 mEq/L (ref 135–145)
Total Bilirubin: 0.5 mg/dL (ref 0.2–1.2)
Total Protein: 6.7 g/dL (ref 6.0–8.3)

## 2021-04-14 LAB — CBC
HCT: 39.5 % (ref 36.0–46.0)
Hemoglobin: 13.7 g/dL (ref 12.0–15.0)
MCHC: 34.8 g/dL (ref 30.0–36.0)
MCV: 89.7 fl (ref 78.0–100.0)
Platelets: 238 10*3/uL (ref 150.0–400.0)
RBC: 4.4 Mil/uL (ref 3.87–5.11)
RDW: 12.8 % (ref 11.5–15.5)
WBC: 5.3 10*3/uL (ref 4.0–10.5)

## 2021-04-14 LAB — TSH: TSH: 1.89 u[IU]/mL (ref 0.35–4.50)

## 2021-04-14 MED ORDER — HYDROCHLOROTHIAZIDE 12.5 MG PO CAPS: 12.5000 mg | ORAL_CAPSULE | Freq: Every day | ORAL | 5 refills | Status: DC

## 2021-04-14 NOTE — Patient Instructions (Addendum)
It was very nice to see you today!  Please start a blood pressure medication.  Keep an eye on your blood pressure over the next few weeks.  I would like to see you back in the office in couple weeks for blood pressure recheck.  We will check blood work and urine sample today.  You take take 1000mg  of tylenol and 400mg  of ibuprofen as needed for headaches.   Take care, Dr [name]  PLEASE NOTE:  If you had any lab tests please let us know if you have not heard back within a few days. You may see your results on mychart before we have a chance to review them but we will give you a call once they are reviewed by [place]. If we ordered any referrals today, please let us know if you have not heard from their office within the next week.   Please try these tips to maintain a healthy lifestyle:   Eat at least 3 REAL meals and 1-2 snacks per day.  Aim for no more than 5 hours between eating.  If you eat breakfast, please do so within one hour of getting up.    Each meal should contain half fruits/vegetables, one quarter protein, and one quarter carbs (no bigger than a computer mouse)   Cut down on sweet beverages. This includes juice, soda, and sweet tea.     Drink at least 1 glass of water with each meal and aim for at least 8 glasses per day   Exercise at least 150 minutes every week.

## 2021-04-14 NOTE — Progress Notes (Signed)
Please inform patient of the following:  She has some blood in her urine but otherwise her blood and urine tests are all normal.  I would like for her to come back in 1 to 2 weeks to recheck blood pressure and we should also recheck her urine at that time.

## 2021-04-14 NOTE — Progress Notes (Signed)
   Martha Orr is a 65 y.o. female who presents today for an office visit.  Assessment/Plan:  New/Acute Problems: Headache Reassuring exam today.  No red flags.  Also reassuring that she had MRI 3 weeks ago that was essentially normal.  Could be related to blood pressure.  We will treat as below.  Also check labs today.  Can use OTC meds as needed for headache.   Chronic Problems Addressed Today: Essential hypertension Above goal today.  She has previously been well controlled.  Unclear why she has had recent elevation in blood pressure readings.  We will start HCTZ 12.5 mg daily.  Discussed potential side effects.  Continue home monitoring.  She will follow-up with me in a couple weeks for blood pressure recheck.  Will check labs today.     Subjective:  HPI:  Patient here for ED follow-up.  Went to the ED 4 days ago with headache and elevated blood pressure readings.  She was given normal saline bolus, Reglan, and Benadryl in the ED.  Her headache improved and she was discharged home.  Her symptoms have persisted over the last 2 days since being home.  Still has persistent headache.  Feels like a throbbing sensation.  No shortness of breath.  No dizziness.  No reported weakness or numbness.  She has had some intermittent pain in her left arm that may be due to recent overexertion.         Objective:  Physical Exam: BP (!) 162/98   Pulse 95   Temp 98.2 F (36.8 C)   Ht 5\' 6"  (1.676 m)   Wt 190 lb 3.2 oz (86.3 kg)   SpO2 (!) 89%   BMI 30.70 kg/m   Gen: No acute distress, resting comfortably CV: Regular rate and rhythm with no murmurs appreciated Pulm: Normal work of breathing, clear to auscultation bilaterally with no crackles, wheezes, or rhonchi MSK: 1+ pitting edema bilaterally Neuro: CN2-12 intact. Strength 5/5 in upper and lower extremities.  Psych: Normal affect and thought content  EKG: Normal sinus rhythm.  Diffuse T wave inversions stable compared to previous  EKG.  No acute ischemic changes.  Time Spent: 52 minutes of total time was spent on the date of the encounter performing the following actions: chart review prior to seeing the patient including recent ED visit, obtaining history, performing a medically necessary exam, counseling on the treatment plan, placing orders, and documenting in our EHR.        Algis Greenhouse. [name], MD 04/14/2021 11:54 AM

## 2021-04-14 NOTE — Assessment & Plan Note (Signed)
Above goal today.  She has previously been well controlled.  Unclear why she has had recent elevation in blood pressure readings.  We will start HCTZ 12.5 mg daily.  Discussed potential side effects.  Continue home monitoring.  She will follow-up with me in a couple weeks for blood pressure recheck.  Will check labs today.

## 2021-04-15 ENCOUNTER — Telehealth: Payer: Self-pay

## 2021-04-15 NOTE — Telephone Encounter (Signed)
Spoke with patient regarding lab results, gave verbal understanding.

## 2021-04-15 NOTE — Telephone Encounter (Signed)
Pt returned Kaiya's phone call about lab results. Please advise.

## 2021-05-04 ENCOUNTER — Other Ambulatory Visit: Payer: Self-pay

## 2021-05-04 ENCOUNTER — Encounter: Payer: Self-pay | Admitting: Family Medicine

## 2021-05-04 ENCOUNTER — Ambulatory Visit: Payer: Medicare Other | Admitting: Family Medicine

## 2021-05-04 VITALS — BP 119/79 | HR 80 | Temp 98.2°F | Ht 66.0 in | Wt 189.0 lb

## 2021-05-04 DIAGNOSIS — I10 Essential (primary) hypertension: Secondary | ICD-10-CM | POA: Diagnosis not present

## 2021-05-04 DIAGNOSIS — R319 Hematuria, unspecified: Secondary | ICD-10-CM

## 2021-05-04 MED ORDER — HYDROCHLOROTHIAZIDE 12.5 MG PO CAPS: 12.5000 mg | ORAL_CAPSULE | Freq: Every day | ORAL | 3 refills | Status: DC

## 2021-05-04 NOTE — Assessment & Plan Note (Signed)
Patient reports that she had urologic work-up in the past which was negative.  Does not need any further work-up at this time.  She does not have any symptoms.

## 2021-05-04 NOTE — Progress Notes (Signed)
   Martha Orr is a 65 y.o. female who presents today for an office visit.  Assessment/Plan:  New/Acute Problems: Pruritis Recommended topical cortisone cream as needed.  Shoulder Pain Has mild rotator cuff pain.  No red flags.  She can continue using over-the-counter meds as needed.  Discussed home exercises and handout was given.  She will let me know if symptoms persist and we can consider referral to physical therapy or sports medicine.  Chronic Problems Addressed Today: Hematuria Patient reports that she had urologic work-up in the past which was negative.  Does not need any further work-up at this time.  She does not have any symptoms.  Essential hypertension Much better controlled with addition of HCTZ 12.5 mg daily.  No side effects.  We will continue current dose.  She will continue home monitoring.  Follow-up in 9 months for CPE.     Subjective:  HPI:  Patient here for follow-up.  Seen about a month ago.  Found to have elevated blood pressure at that time.  We started HCTZ 12.5 mg daily.  We also found to have blood in her urine a few weeks ago.        Objective:  Physical Exam: BP 119/79   Pulse 80   Temp 98.2 F (36.8 C) (Temporal)   Ht 5\' 6"  (1.676 m)   Wt 189 lb (85.7 kg)   SpO2 98%   BMI 30.51 kg/m   Gen: No acute distress, resting comfortably HEENT: Bilateral EAC with dry skin. CV: Regular rate and rhythm with no murmurs appreciated Pulm: Normal work of breathing, clear to auscultation bilaterally with no crackles, wheezes, or rhonchi MSK: Left shoulder tender to palpation along anterior edge.  Pain elicited with resisted supraspinatus testing.  Neer test positive. Neuro: Grossly normal, moves all extremities Psych: Normal affect and thought content      Daoud Lobue M. [name], MD 05/04/2021 3:11 PM

## 2021-05-04 NOTE — Patient Instructions (Signed)
It was very nice to see you today!  I am glad your blood pressure is looking better.  I will refill your medication.  Please work on the exercises for your shoulder.  You can use cortisone cream for your ear.  I will see you back in 9 months.  Come back to see me sooner if needed.  Take care, Dr [name]  PLEASE NOTE:  If you had any lab tests please let us know if you have not heard back within a few days. You may see your results on mychart before we have a chance to review them but we will give you a call once they are reviewed by [place]. If we ordered any referrals today, please let us know if you have not heard from their office within the next week.   Please try these tips to maintain a healthy lifestyle:   Eat at least 3 REAL meals and 1-2 snacks per day.  Aim for no more than 5 hours between eating.  If you eat breakfast, please do so within one hour of getting up.    Each meal should contain half fruits/vegetables, one quarter protein, and one quarter carbs (no bigger than a computer mouse)   Cut down on sweet beverages. This includes juice, soda, and sweet tea.     Drink at least 1 glass of water with each meal and aim for at least 8 glasses per day   Exercise at least 150 minutes every week.

## 2021-05-04 NOTE — Assessment & Plan Note (Signed)
Much better controlled with addition of HCTZ 12.5 mg daily.  No side effects.  We will continue current dose.  She will continue home monitoring.  Follow-up in 9 months for CPE.

## 2021-05-08 ENCOUNTER — Telehealth: Payer: Self-pay

## 2021-05-08 NOTE — Telephone Encounter (Signed)
ERROR

## 2021-05-12 ENCOUNTER — Other Ambulatory Visit: Payer: Self-pay | Admitting: *Deleted

## 2021-05-12 ENCOUNTER — Telehealth: Payer: Self-pay

## 2021-05-12 MED ORDER — TRIAMCINOLONE ACETONIDE 0.1 % EX CREA: 1.0000 "application " | TOPICAL_CREAM | Freq: Two times a day (BID) | CUTANEOUS | 0 refills | Status: DC

## 2021-05-12 NOTE — Telephone Encounter (Signed)
Patient said was pulling weed from yard and got into poison oak  Requesting Rx if is possible [facility]  Please advise

## 2021-05-12 NOTE — Telephone Encounter (Signed)
Patient stated tried cortisone cream and it did not work triamcinolone 0.1% cream send to pharmacy  Advise if no improvement need an OV

## 2021-05-12 NOTE — Telephone Encounter (Signed)
Pt called asking if Dr. [name] could prescribe her something for poison oak. Please advise.

## 2021-05-12 NOTE — Telephone Encounter (Signed)
Has she tried cortisone cream? If not would recommend that first. If she has already tried that then we can send in triamcinolone 0.1% cream apply twice daily. Needs visit if not improving.  Algis Greenhouse. [name], MD 05/12/2021 10:48 AM

## 2021-06-03 ENCOUNTER — Other Ambulatory Visit: Payer: Self-pay | Admitting: Family Medicine

## 2021-06-12 ENCOUNTER — Other Ambulatory Visit: Payer: Self-pay | Admitting: Family Medicine

## 2021-06-30 ENCOUNTER — Telehealth: Payer: Self-pay

## 2021-06-30 MED ORDER — TRIAMCINOLONE ACETONIDE 0.1 % EX CREA: 1.0000 "application " | TOPICAL_CREAM | Freq: Two times a day (BID) | CUTANEOUS | 0 refills | Status: DC

## 2021-06-30 NOTE — Telephone Encounter (Signed)
Spoke to pt told her Dr. [name] said we can refill Triamcinolone cream if symptoms do not improved need to make an appt. Pt verbalized understanding.

## 2021-06-30 NOTE — Telephone Encounter (Signed)
Called patient and she states she is unable to come in for an appointment

## 2021-06-30 NOTE — Telephone Encounter (Signed)
Please call pt and schedule an appointment today with any provider that is available per Dr.Parker.

## 2021-06-30 NOTE — Telephone Encounter (Signed)
Patient called in asking for a prescription for poison ivy. She stated that Dr [name] has refilled it in the past without being seen. She also stated that this happens every year. I offered an appointment but she doesn't want to come in and would like to speak to a nurse or Dr [name].

## 2021-06-30 NOTE — Telephone Encounter (Signed)
Dr. [name], please see message and advise.

## 2021-06-30 NOTE — Telephone Encounter (Signed)
Nurse Assessment Nurse: Windle Guard, RN, Olin Hauser Date/Time (Eastern Time): 06/30/2021 8:14:20 AM Confirm and document reason for call. If symptomatic, describe symptoms. ---Caller states she has poison oak rash / sores all over her body including her face. Does the patient have any new or worsening symptoms? ---Yes Will a triage be completed? ---Yes Related visit to physician within the last 2 weeks? ---No Does the PT have any chronic conditions? (i.e. diabetes, asthma, this includes High risk factors for pregnancy, etc.) ---Yes List chronic conditions. ---Cold Sore Is this a behavioral health or substance abuse call? ---No Guidelines Guideline Title Affirmed Question Affirmed Notes Nurse Date/Time (Eastern Time) Poison Ivy - [place] [1] Severe poison ivy, oak, or sumac reaction in the past AND [2] face or genitals involved Conner, RN, Olin Hauser 06/30/2021 8:16:30 AM Disp. Time Eilene Ghazi Time) Disposition Final User PLEASE NOTE: All timestamps contained within this report are represented as [place] Standard Time. CONFIDENTIALTY NOTICE: This fax transmission is intended only for the addressee. It contains information that is legally privileged, confidential or otherwise protected from use or disclosure. If you are not the intended recipient, you are strictly prohibited from reviewing, disclosing, copying using or disseminating any of this information or taking any action in reliance on or regarding this information. If you have received this fax in error, please notify us immediately by telephone so that we can arrange for its return to [place]. Phone: [phone], Toll-Free: [phone], Fax: [phone] Page: 2 of 2 Call Id: [id number] 06/30/2021 8:17:45 AM See HCP within 4 Hours (or PCP triage) Yes Windle Guard, RN, Otho Najjar Disagree/Comply Comply Caller Understands Yes PreDisposition Call Doctor Care Advice Given Per Guideline SEE HCP (OR PCP TRIAGE) WITHIN 4 HOURS: * IF OFFICE WILL BE  OPEN: You need to be seen within the next 3 or 4 hours. Call your doctor (or NP/PA) now or as soon as the office opens. CALL BACK IF: * You become worse CARE ADVICE given per [place] (Adult) guideline. Comments User: Harvin Hazel, RN Date/Time Eilene Ghazi Time): 06/30/2021 8:20:02 AM Warm transferred to speak w/ [place] in the office. Referrals REFERRED TO PCP OFFIC

## 2022-01-12 ENCOUNTER — Other Ambulatory Visit: Payer: Self-pay | Admitting: Family Medicine

## 2022-02-08 ENCOUNTER — Ambulatory Visit (INDEPENDENT_AMBULATORY_CARE_PROVIDER_SITE_OTHER): Payer: Medicare Other

## 2022-02-08 ENCOUNTER — Other Ambulatory Visit: Payer: Self-pay

## 2022-02-08 DIAGNOSIS — Z0000 Encounter for general adult medical examination without abnormal findings: Secondary | ICD-10-CM | POA: Diagnosis not present

## 2022-02-08 NOTE — Patient Instructions (Signed)
Martha Orr , Thank you for taking time to come for your Medicare Wellness Visit. I appreciate your ongoing commitment to your health goals. Please review the following plan we discussed and let me know if I can assist you in the future.   Screening recommendations/referrals: Colonoscopy: Done 02/19/21 repeat every 10 years  Mammogram: Done 01/27/22 repeat every year  Bone Density: Done 11/25/16 repeat veery 2 years  Recommended yearly ophthalmology/optometry visit for glaucoma screening and checkup Recommended yearly dental visit for hygiene and checkup  Vaccinations: Influenza vaccine: Due  and discussed  Pneumococcal vaccine: Due and discussed  Tdap vaccine: postponed 04/2022 Shingles vaccine: Shingrix discussed. Please contact your pharmacy for coverage information.    Covid-19:Declined and discussed   Advanced directives: Advance directive discussed with you today. Even though you declined this today please call our office should you change your mind and we can give you the proper paperwork for you to fill out.  Conditions/risks identified: Lose weight   Next appointment: Follow up in one year for your annual wellness visit    Preventive Care 65 Years and Older, Female Preventive care refers to lifestyle choices and visits with your health care provider that can promote health and wellness. What does preventive care include? A yearly physical exam. This is also called an annual well check. Dental exams once or twice a year. Routine eye exams. Ask your health care provider how often you should have your eyes checked. Personal lifestyle choices, including: Daily care of your teeth and gums. Regular physical activity. Eating a healthy diet. Avoiding tobacco and drug use. Limiting alcohol use. Practicing safe sex. Taking low-dose aspirin every day. Taking vitamin and mineral supplements as recommended by your health care provider. What happens during an annual well check? The  services and screenings done by your health care provider during your annual well check will depend on your age, overall health, lifestyle risk factors, and family history of disease. Counseling  Your health care provider may ask you questions about your: Alcohol use. Tobacco use. Drug use. Emotional well-being. Home and relationship well-being. Sexual activity. Eating habits. History of falls. Memory and ability to understand (cognition). Work and work Statistician. Reproductive health. Screening  You may have the following tests or measurements: Height, weight, and BMI. Blood pressure. Lipid and cholesterol levels. These may be checked every 5 years, or more frequently if you are over 21 years old. Skin check. Lung cancer screening. You may have this screening every year starting at age 59 if you have a 30-pack-year history of smoking and currently smoke or have quit within the past 15 years. Fecal occult blood test (FOBT) of the stool. You may have this test every year starting at age 3. Flexible sigmoidoscopy or colonoscopy. You may have a sigmoidoscopy every 5 years or a colonoscopy every 10 years starting at age 35. Hepatitis C blood test. Hepatitis B blood test. Sexually transmitted disease (STD) testing. Diabetes screening. This is done by checking your blood sugar (glucose) after you have not eaten for a while (fasting). You may have this done every 1-3 years. Bone density scan. This is done to screen for osteoporosis. You may have this done starting at age 55. Mammogram. This may be done every 1-2 years. Talk to your health care provider about how often you should have regular mammograms. Talk with your health care provider about your test results, treatment options, and if necessary, the need for more tests. Vaccines  Your health care provider may recommend  certain vaccines, such as: Influenza vaccine. This is recommended every year. Tetanus, diphtheria, and acellular  pertussis (Tdap, Td) vaccine. You may need a Td booster every 10 years. Zoster vaccine. You may need this after age 45. Pneumococcal 13-valent conjugate (PCV13) vaccine. One dose is recommended after age 55. Pneumococcal polysaccharide (PPSV23) vaccine. One dose is recommended after age 73. Talk to your health care provider about which screenings and vaccines you need and how often you need them. This information is not intended to replace advice given to you by your health care provider. Make sure you discuss any questions you have with your health care provider. Document Released: 01/09/2016 Document Revised: 09/01/2016 Document Reviewed: 10/14/2015 Elsevier Interactive Patient Education  2017 [place] Prevention in the Home Falls can cause injuries. They can happen to people of all ages. There are many things you can do to make your home safe and to help prevent falls. What can I do on the outside of my home? Regularly fix the edges of walkways and driveways and fix any cracks. Remove anything that might make you trip as you walk through a door, such as a raised step or threshold. Trim any bushes or trees on the path to your home. Use bright outdoor lighting. Clear any walking paths of anything that might make someone trip, such as rocks or tools. Regularly check to see if handrails are loose or broken. Make sure that both sides of any steps have handrails. Any raised decks and porches should have guardrails on the edges. Have any leaves, snow, or ice cleared regularly. Use sand or salt on walking paths during winter. Clean up any spills in your garage right away. This includes oil or grease spills. What can I do in the bathroom? Use night lights. Install grab bars by the toilet and in the tub and shower. Do not use towel bars as grab bars. Use non-skid mats or decals in the tub or shower. If you need to sit down in the shower, use a plastic, non-slip stool. Keep the floor  dry. Clean up any water that spills on the floor as soon as it happens. Remove soap buildup in the tub or shower regularly. Attach bath mats securely with double-sided non-slip rug tape. Do not have throw rugs and other things on the floor that can make you trip. What can I do in the bedroom? Use night lights. Make sure that you have a light by your bed that is easy to reach. Do not use any sheets or blankets that are too big for your bed. They should not hang down onto the floor. Have a firm chair that has side arms. You can use this for support while you get dressed. Do not have throw rugs and other things on the floor that can make you trip. What can I do in the kitchen? Clean up any spills right away. Avoid walking on wet floors. Keep items that you use a lot in easy-to-reach places. If you need to reach something above you, use a strong step stool that has a grab bar. Keep electrical cords out of the way. Do not use floor polish or wax that makes floors slippery. If you must use wax, use non-skid floor wax. Do not have throw rugs and other things on the floor that can make you trip. What can I do with my stairs? Do not leave any items on the stairs. Make sure that there are handrails on both sides of the  stairs and use them. Fix handrails that are broken or loose. Make sure that handrails are as long as the stairways. Check any carpeting to make sure that it is firmly attached to the stairs. Fix any carpet that is loose or worn. Avoid having throw rugs at the top or bottom of the stairs. If you do have throw rugs, attach them to the floor with carpet tape. Make sure that you have a light switch at the top of the stairs and the bottom of the stairs. If you do not have them, ask someone to add them for you. What else can I do to help prevent falls? Wear shoes that: Do not have high heels. Have rubber bottoms. Are comfortable and fit you well. Are closed at the toe. Do not wear  sandals. If you use a stepladder: Make sure that it is fully opened. Do not climb a closed stepladder. Make sure that both sides of the stepladder are locked into place. Ask someone to hold it for you, if possible. Clearly mark and make sure that you can see: Any grab bars or handrails. First and last steps. Where the edge of each step is. Use tools that help you move around (mobility aids) if they are needed. These include: Canes. Walkers. Scooters. Crutches. Turn on the lights when you go into a dark area. Replace any light bulbs as soon as they burn out. Set up your furniture so you have a clear path. Avoid moving your furniture around. If any of your floors are uneven, fix them. If there are any pets around you, be aware of where they are. Review your medicines with your doctor. Some medicines can make you feel dizzy. This can increase your chance of falling. Ask your doctor what other things that you can do to help prevent falls. This information is not intended to replace advice given to you by your health care provider. Make sure you discuss any questions you have with your health care provider. Document Released: 10/09/2009 Document Revised: 05/20/2016 Document Reviewed: 01/17/2015 Elsevier Interactive Patient Education  2017 [employer].

## 2022-02-08 NOTE — Progress Notes (Signed)
Virtual Visit via Telephone Note  I connected with  Martha Orr on 02/08/22 at  9:30 AM EST by telephone and verified that I am speaking with the correct person using two identifiers.  Medicare Annual Wellness visit completed telephonically due to Covid-19 pandemic.   Persons participating in this call: This Health Coach and this patient.   Location: Patient: home Provider: office   I discussed the limitations, risks, security and privacy concerns of performing an evaluation and management service by telephone and the availability of in person appointments. The patient expressed understanding and agreed to proceed.  Unable to perform video visit due to video visit attempted and failed and/or patient does not have video capability.   Some vital signs may be absent or patient reported.   Willette Brace, LPN   Subjective:   Martha Orr is a 66 y.o. female who presents for Medicare Annual (Subsequent) preventive examination.  Review of Systems     Cardiac Risk Factors include: advanced age (>39men, >82 women);hypertension;obesity (BMI >30kg/m2)     Objective:    There were no vitals filed for this visit. There is no height or weight on file to calculate BMI.  Advanced Directives 02/08/2022 04/10/2021 06/05/2020 12/10/2016 11/02/2016 10/13/2016 10/04/2016  Does Patient Have a Medical Advance Directive? Yes No Yes No No No No  Type of Advance Directive [place] in Chart? (No Data) - - - - - -  Would patient like information on creating a medical advance directive? - No - Patient declined - - No - patient declined information No - patient declined information No - patient declined information    Current Medications (verified) Outpatient Encounter Medications as of 02/08/2022  Medication Sig   aspirin EC 81 MG tablet Take 81 mg by mouth daily.   Biotin w/ Vitamins C & E (HAIR/SKIN/NAILS PO) Take by mouth.    cholecalciferol (VITAMIN D) 1000 units tablet Take 1,000 Units by mouth at bedtime.   escitalopram (LEXAPRO) 20 MG tablet TAKE 1 TABLET DAILY   Ginkgo Biloba 40 MG TABS Take by mouth.   hydrochlorothiazide (MICROZIDE) 12.5 MG capsule Take 1 capsule (12.5 mg total) by mouth daily.   omeprazole (PRILOSEC) 40 MG capsule Take 40 mg by mouth daily. As needed   rosuvastatin (CRESTOR) 20 MG tablet TAKE 1 TABLET AT BEDTIME   SYNTHROID 112 MCG tablet TAKE 1 TABLET AT BEDTIME   acyclovir (ZOVIRAX) 400 MG tablet As needed   amoxicillin (AMOXIL) 500 MG capsule TAKE FOUR CAPSULES BY MOUTH ONE HOUR BEFORE APPOINTMENT (Patient not taking: Reported on 05/04/2021)   [DISCONTINUED] metoprolol tartrate (LOPRESSOR) 100 MG tablet Take 1 tablet by mouth 2 hours prior to Cardiac CT (Patient not taking: Reported on 02/08/2022)   [DISCONTINUED] PENNSAID 2 % SOLN Apply 1 application topically 2 (two) times daily as needed. To affected areas for pain.   [DISCONTINUED] triamcinolone cream (KENALOG) 0.1 % Apply 1 application topically 2 (two) times daily.   No facility-administered encounter medications on file as of 02/08/2022.    Allergies (verified) No known allergies   History: Past Medical History:  Diagnosis Date   Arthritis    Chronic Orr pain    buldging disc   Depression    takes Lexapro daily   Family history of adverse reaction to anesthesia    sister gets sick after anesthesia   High cholesterol    takes Crestor daily  History of colon polyps    benign   History of shingles    Hypothyroidism    takes Synthroid daily   Joint pain    Joint swelling    Microscopic hematuria    states her entire life and family is the same way   Past Surgical History:  Procedure Laterality Date   ABDOMINAL HYSTERECTOMY  1998   APPENDECTOMY     BREAST BIOPSY     BUNIONECTOMY Bilateral    CARPAL TUNNEL RELEASE Bilateral    CHOLECYSTECTOMY     COLONOSCOPY     KNEE ARTHROSCOPY Right    KNEE ARTHROSCOPY  Left 11/01/2017   Procedure: ARTHROSCOPY LEFT KNEE;  Surgeon: Melrose Nakayama, MD;  Location: [facility];  Service: Orthopedics;  Laterality: Left;   knot removed from right breast     NASAL SINUS SURGERY     TONSILLECTOMY     TONSILLECTOMY AND ADENOIDECTOMY  2002   TOTAL KNEE ARTHROPLASTY Bilateral 10/12/2016   Procedure: TOTAL KNEE BILATERAL;  Surgeon: Melrose Nakayama, MD;  Location: [facility];  Service: Orthopedics;  Laterality: Bilateral;   Family History  Problem Relation Age of Onset   Heart attack Mother    Hyperlipidemia Mother    Arthritis Father    Arthritis Sister    Arthritis Sister    Heart attack Sister    Arthritis Sister    Hyperlipidemia Sister    Stroke Sister    Cancer Brother    Depression Brother    Heart attack Brother    Heart disease Brother    Hypertension Brother    Hyperlipidemia Brother    Cancer Brother    Heart attack Brother    Social History   Socioeconomic History   Marital status: Married    Spouse name: Not on file   Number of children: Not on file   Years of education: Not on file   Highest education level: Not on file  Occupational History   Not on file  Tobacco Use   Smoking status: Never   Smokeless tobacco: Never  Vaping Use   Vaping Use: Never used  Substance and Sexual Activity   Alcohol use: Yes    Comment: occasionally   Drug use: No   Sexual activity: Not on file  Other Topics Concern   Not on file  Social History Narrative   Not on file   Social Determinants of Health   Financial Resource Strain: Low Risk    Difficulty of Paying Living Expenses: Not hard at all  Food Insecurity: No Food Insecurity   Worried About Charity fundraiser in the Last Year: Never true   [place] in the Last Year: Never true  Transportation Needs: No Transportation Needs   Lack of Transportation (Medical): No   Lack of Transportation (Non-Medical): No  Physical Activity: Inactive   Days of Exercise per Week: 0 days   Minutes of  Exercise per Session: 0 min  Stress: No Stress Concern Present   Feeling of Stress : Not at all  Social Connections: Moderately Integrated   Frequency of Communication with Friends and Family: More than three times a week   Frequency of Social Gatherings with Friends and Family: Twice a week   Attends Religious Services: More than 4 times per year   Active Member of [employer] or Organizations: No   Attends Archivist Meetings: Never   Marital Status: Married    Tobacco Counseling Counseling given: Not Answered  Clinical Intake:  Pre-visit preparation completed: Yes  Pain : No/denies pain     BMI - recorded: 30.52 Nutritional Status: BMI > 30  Obese Nutritional Risks: None Diabetes: No  How often do you need to have someone help you when you read instructions, pamphlets, or other written materials from your doctor or pharmacy?: 1 - Never  Diabetic?no  Interpreter Needed?: No  Information entered by :: [name], LPN   Activities of Daily Living In your present state of health, do you have any difficulty performing the following activities: 02/08/2022  Hearing? Y  Vision? N  Difficulty concentrating or making decisions? N  Walking or climbing stairs? N  Dressing or bathing? N  Doing errands, shopping? N  Preparing Food and eating ? N  Using the Toilet? N  In the past six months, have you accidently leaked urine? N  Do you have problems with loss of bowel control? N  Managing your Medications? N  Managing your Finances? N  Housekeeping or managing your Housekeeping? N  Some recent data might be hidden    Patient Care Team: Vivi Barrack, MD as PCP - General (Family Medicine) Jettie Booze, MD as PCP - Cardiology (Cardiology) Jamey Reas, MD as Consulting Physician (Obstetrics and Gynecology) Normand Sloop, MD as Consulting Physician (Endocrinology) Sandford Craze, MD as Referring Physician (Dermatology) Melrose Nakayama, MD as  Consulting Physician (Orthopedic Surgery) Quitman Livings, DDS as Consulting Physician (Dentistry) Aletha Halim, PT as Consulting Physician (Physical Therapy) Normajean Glasgow, MD as Attending Physician (Physical Medicine and Rehabilitation)  Indicate any recent Medical Services you may have received from other than [facility] providers in the past year (date may be approximate).     Assessment:   This is a routine wellness examination for Martha Orr.  Hearing/Vision screen Hearing Screening - Comments:: Pt stated slight loss  Vision Screening - Comments:: Pt follows up with Dr Juanita Craver for annual eye exams   Dietary issues and exercise activities discussed: Current Exercise Habits: The patient does not participate in regular exercise at present   Goals Addressed             This Visit's Progress    Patient Stated       Lose weight        Depression Screen PHQ 2/9 Scores 02/08/2022 02/06/2021 08/02/2019  PHQ - 2 Score 0 2 0  PHQ- 9 Score - 3 1    Fall Risk Fall Risk  02/08/2022  Falls in the past year? 0  Number falls in past yr: 0  Injury with Fall? 0  Risk for fall due to : Impaired vision  Follow up Falls prevention discussed    FALL RISK PREVENTION PERTAINING TO THE HOME:  Any stairs in or around the home? Yes  If so, are there any without handrails? No  Home free of loose throw rugs in walkways, pet beds, electrical cords, etc? Yes  Adequate lighting in your home to reduce risk of falls? Yes   ASSISTIVE DEVICES UTILIZED TO PREVENT FALLS:  Life alert? No  Use of a cane, walker or w/c? No  Grab bars in the bathroom? No  Shower chair or bench in shower? Yes  Elevated toilet seat or a handicapped toilet? No   TIMED UP AND GO:  Was the test performed? No .   Cognitive Function:     6CIT Screen 02/08/2022  What Year? 0 points  What month? 0 points  What time? 3 points  Count Orr from  20 0 points  Months in reverse 0 points  Repeat phrase 0 points  Total Score 3     Immunizations Immunization History  Administered Date(s) Administered   Influenza-Unspecified 11/28/2018    TDAP status: Due, Education has been provided regarding the importance of this vaccine. Advised may receive this vaccine at local pharmacy or Health Dept. Aware to provide a copy of the vaccination record if obtained from local pharmacy or Health Dept. Verbalized acceptance and understanding.  Flu Vaccine status: Due, Education has been provided regarding the importance of this vaccine. Advised may receive this vaccine at local pharmacy or Health Dept. Aware to provide a copy of the vaccination record if obtained from local pharmacy or Health Dept. Verbalized acceptance and understanding.  Pneumococcal vaccine status: Due, Education has been provided regarding the importance of this vaccine. Advised may receive this vaccine at local pharmacy or Health Dept. Aware to provide a copy of the vaccination record if obtained from local pharmacy or Health Dept. Verbalized acceptance and understanding.  Covid-19 vaccine status: Declined, Education has been provided regarding the importance of this vaccine but patient still declined. Advised may receive this vaccine at local pharmacy or Health Dept.or vaccine clinic. Aware to provide a copy of the vaccination record if obtained from local pharmacy or Health Dept. Verbalized acceptance and understanding.  Qualifies for Shingles Vaccine? Yes   Zostavax completed No   Shingrix Completed?: No.    Education has been provided regarding the importance of this vaccine. Patient has been advised to call insurance company to determine out of pocket expense if they have not yet received this vaccine. Advised may also receive vaccine at local pharmacy or Health Dept. Verbalized acceptance and understanding.  Screening Tests Health Maintenance  Topic Date Due   Hepatitis C Screening  Never done   Zoster Vaccines- Shingrix (1 of 2) Never done   Pneumonia  Vaccine 89+ Years old (1 - PCV) Never done   INFLUENZA VACCINE  07/27/2021   TETANUS/TDAP  05/04/2022 (Originally 04/23/1975)   MAMMOGRAM  01/28/2024   COLONOSCOPY (Pts 45-3yrs Insurance coverage will need to be confirmed)  02/19/2031   DEXA SCAN  Completed   HPV VACCINES  Aged Out   COVID-19 Vaccine  Discontinued    Health Maintenance  Health Maintenance Due  Topic Date Due   Hepatitis C Screening  Never done   Zoster Vaccines- Shingrix (1 of 2) Never done   Pneumonia Vaccine 14+ Years old (1 - PCV) Never done   INFLUENZA VACCINE  07/27/2021    Colorectal cancer screening: Type of screening: Colonoscopy. Completed 02/19/21. Repeat every 10 years  Mammogram status: Completed 01/27/22. Repeat every year  Bone Density status: Completed 11/25/16. Results reflect: Bone density results: NORMAL. Repeat every 2 years.   Additional Screening:  Hepatitis C Screening: does qualify;  Vision Screening: Recommended annual ophthalmology exams for early detection of glaucoma and other disorders of the eye. Is the patient up to date with their annual eye exam?  Yes  Who is the provider or what is the name of the office in which the patient attends annual eye exams? Dr Juanita Craver If pt is not established with a provider, would they like to be referred to a provider to establish care? No .   Dental Screening: Recommended annual dental exams for proper oral hygiene  Community Resource Referral / Chronic Care Management: CRR required this visit?  No   CCM required this visit?  No      Plan:  I have personally reviewed and noted the following in the patients chart:   Medical and social history Use of alcohol, tobacco or illicit drugs  Current medications and supplements including opioid prescriptions.  Functional ability and status Nutritional status Physical activity Advanced directives List of other physicians Hospitalizations, surgeries, and ER visits in previous 12  months Vitals Screenings to include cognitive, depression, and falls Referrals and appointments  In addition, I have reviewed and discussed with patient certain preventive protocols, quality metrics, and best practice recommendations. A written personalized care plan for preventive services as well as general preventive health recommendations were provided to patient.     Willette Brace, LPN   02/14/4711   Nurse Notes: Pt stated she will discuss her heartburn concerns at visit in the a.m 02/09/22

## 2022-02-09 ENCOUNTER — Ambulatory Visit (INDEPENDENT_AMBULATORY_CARE_PROVIDER_SITE_OTHER): Payer: Medicare Other | Admitting: Family Medicine

## 2022-02-09 ENCOUNTER — Encounter: Payer: Self-pay | Admitting: Family Medicine

## 2022-02-09 VITALS — BP 142/78 | HR 64 | Temp 98.0°F | Ht 66.0 in | Wt 192.4 lb

## 2022-02-09 DIAGNOSIS — E538 Deficiency of other specified B group vitamins: Secondary | ICD-10-CM | POA: Diagnosis not present

## 2022-02-09 DIAGNOSIS — E785 Hyperlipidemia, unspecified: Secondary | ICD-10-CM

## 2022-02-09 DIAGNOSIS — I10 Essential (primary) hypertension: Secondary | ICD-10-CM | POA: Diagnosis not present

## 2022-02-09 DIAGNOSIS — F325 Major depressive disorder, single episode, in full remission: Secondary | ICD-10-CM

## 2022-02-09 DIAGNOSIS — Z0001 Encounter for general adult medical examination with abnormal findings: Secondary | ICD-10-CM

## 2022-02-09 DIAGNOSIS — E039 Hypothyroidism, unspecified: Secondary | ICD-10-CM

## 2022-02-09 DIAGNOSIS — R918 Other nonspecific abnormal finding of lung field: Secondary | ICD-10-CM

## 2022-02-09 DIAGNOSIS — F419 Anxiety disorder, unspecified: Secondary | ICD-10-CM

## 2022-02-09 DIAGNOSIS — R739 Hyperglycemia, unspecified: Secondary | ICD-10-CM | POA: Diagnosis not present

## 2022-02-09 DIAGNOSIS — I712 Thoracic aortic aneurysm, without rupture, unspecified: Secondary | ICD-10-CM

## 2022-02-09 DIAGNOSIS — H9203 Otalgia, bilateral: Secondary | ICD-10-CM

## 2022-02-09 DIAGNOSIS — E559 Vitamin D deficiency, unspecified: Secondary | ICD-10-CM

## 2022-02-09 DIAGNOSIS — J309 Allergic rhinitis, unspecified: Secondary | ICD-10-CM

## 2022-02-09 LAB — CBC
HCT: 39.9 % (ref 36.0–46.0)
Hemoglobin: 13.5 g/dL (ref 12.0–15.0)
MCHC: 33.8 g/dL (ref 30.0–36.0)
MCV: 89.8 fl (ref 78.0–100.0)
Platelets: 246 10*3/uL (ref 150.0–400.0)
RBC: 4.45 Mil/uL (ref 3.87–5.11)
RDW: 13.1 % (ref 11.5–15.5)
WBC: 5.2 10*3/uL (ref 4.0–10.5)

## 2022-02-09 LAB — VITAMIN B12: Vitamin B-12: 538 pg/mL (ref 211–911)

## 2022-02-09 LAB — HEMOGLOBIN A1C: Hgb A1c MFr Bld: 5.9 % (ref 4.6–6.5)

## 2022-02-09 LAB — VITAMIN D 25 HYDROXY (VIT D DEFICIENCY, FRACTURES): VITD: 14.84 ng/mL — ABNORMAL LOW (ref 30.00–100.00)

## 2022-02-09 LAB — TSH: TSH: 1.19 u[IU]/mL (ref 0.35–5.50)

## 2022-02-09 MED ORDER — OMEPRAZOLE 40 MG PO CPDR: 40.0000 mg | DELAYED_RELEASE_CAPSULE | Freq: Every day | ORAL | 3 refills | Status: DC

## 2022-02-09 MED ORDER — HYDROCORTISONE-ACETIC ACID 1-2 % OT SOLN: 3.0000 [drp] | Freq: Two times a day (BID) | OTIC | 0 refills | Status: DC

## 2022-02-09 NOTE — Assessment & Plan Note (Signed)
At goal per JNC 8 on HCTZ 12.5 mg daily.  Check labs.

## 2022-02-09 NOTE — Assessment & Plan Note (Signed)
Has seborrheic dermatitis.  We will start acetic acid-hydrocortisone drops.

## 2022-02-09 NOTE — Assessment & Plan Note (Signed)
Check TSH.  Continue Synthroid 112 mcg daily. 

## 2022-02-09 NOTE — Progress Notes (Signed)
Chief Complaint:  Martha Orr is a 66 y.o. female who presents today for her annual comprehensive physical exam.    Assessment/Plan:  New/Acute Problems: Atypical chest pain No red flag signs or symptoms.  Has had improvement with PPI-likely GERD.  Had CT calcium score couple years ago which showed low risk for cardiac etology though we did incidentally find pulmonary nodules and a thoracic aneurysm.  We plan to repeat CT scan last year however this was not done.  We will repeat now.  She will continue PPI for the next 6 weeks and let me know if not improving.  Discussed reasons to return to care.  Chronic Problems Addressed Today: Allergic rhinitis Stable.  We have tried Astelin in the past without improvement.  She has been referred to ENT in the past but does not wish to have surgery at this point.  She will let me know if she needs another referral.  Thoracic aortic aneurysm Repeat CTA per radiology recommendations.  Hyperglycemia Check A1c.  Essential hypertension At goal per JNC 8 on HCTZ 12.5 mg daily.  Check labs.  Otalgia of both ears Has seborrheic dermatitis.  We will start acetic acid-hydrocortisone drops.  Pulmonary nodules   We will repeat CT scan per recommendations.  Hypothyroidism Check TSH.  Continue Synthroid 112 mcg daily.  Dyslipidemia On Crestor 20 mg daily.  Check lipids.  Anxiety On Lexapro 20 mg daily.  We will continue.  Major depressive disorder, single episode, in remission (HCC) Stable on Lexapro 20 mg daily.   Preventative Healthcare: Will get blood work done today. Discussed shingle vaccine. UTD on other vaccines and screenings.  Patient Counseling(The following topics were reviewed and/or handout was given):  -Nutrition: Stressed importance of moderation in sodium/caffeine intake, saturated fat and cholesterol, caloric balance, sufficient intake of fresh fruits, vegetables, and fiber.  -Stressed the importance of regular exercise.    -Substance Abuse: Discussed cessation/primary prevention of tobacco, alcohol, or other drug use; driving or other dangerous activities under the influence; availability of treatment for abuse.   -Injury prevention: Discussed safety belts, safety helmets, smoke detector, smoking near bedding or upholstery.   -Sexuality: Discussed sexually transmitted diseases, partner selection, use of condoms, avoidance of unintended pregnancy and contraceptive alternatives.   -Dental health: Discussed importance of regular tooth brushing, flossing, and dental visits.  -Health maintenance and immunizations reviewed. Please refer to Health maintenance section.  Return to care in 1 year for next preventative visit.     Subjective:  HPI:  She has no acute complaints today.   Patient here with heart burn and increased acid reflux. This has been going on for a couple of months. She has seen her OB/GYN and was prescribed Omeprazole 40 mg daily for this issue. She takes the medication as needed. This has helped with the symptoms. She notes whenever she eat food get stuck. She takes Imodium as needed with some improvement for this issue. She has been feeling fatigued recently. She does have restless sleep at night. She notes she take Tynelol and this helped. No reported shortness of breath or chest pain. No other obvious aggravating or precipitating factors. She does have a family hx of heart attack.  She still have some issue with itching and dryness in the ear. She has tried Vaseline with some relief. She denies bumps or blister around the area.    Lifestyle Diet: None specific. Exercise: None specific.  Depression screen [facility] 2/9 02/09/2022  Decreased Interest 0  Down,  Depressed, Hopeless 0  PHQ - 2 Score 0  Altered sleeping -  Tired, decreased energy -  Change in appetite -  Feeling bad or failure about yourself  -  Trouble concentrating -  Moving slowly or fidgety/restless -  Suicidal thoughts -   PHQ-9 Score -  Difficult doing work/chores -    Health Maintenance Due  Topic Date Due   Hepatitis C Screening  Never done   Zoster Vaccines- Shingrix (1 of 2) Never done     ROS: Per HPI, otherwise a complete review of systems was negative.   PMH:  The following were reviewed and entered/updated in epic: Past Medical History:  Diagnosis Date   Arthritis    Chronic back pain    buldging disc   Depression    takes Lexapro daily   Family history of adverse reaction to anesthesia    sister gets sick after anesthesia   High cholesterol    takes Crestor daily   History of colon polyps    benign   History of shingles    Hypothyroidism    takes Synthroid daily   Joint pain    Joint swelling    Microscopic hematuria    states her entire life and family is the same way   Patient Active Problem List   Diagnosis Date Noted   Thoracic aortic aneurysm 02/09/2022   Allergic rhinitis 02/09/2022   Hematuria 05/04/2021   Essential hypertension 02/06/2021   Hyperglycemia 02/06/2021   Pulmonary nodules 04/23/2020   Otalgia of both ears 04/23/2020   Major depressive disorder, single episode, in remission ([place]) 08/02/2019   Anxiety 08/02/2019   Dyslipidemia 08/02/2019   Hypothyroidism 08/02/2019   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   Osteoarthritis 08/02/2019   Restless legs 08/02/2019   Bilateral primary osteoarthritis of knee 10/12/2016   Past Surgical History:  Procedure Laterality Date   ABDOMINAL HYSTERECTOMY  1998   APPENDECTOMY     BREAST BIOPSY     BUNIONECTOMY Bilateral    CARPAL TUNNEL RELEASE Bilateral    CHOLECYSTECTOMY     COLONOSCOPY     KNEE ARTHROSCOPY Right    KNEE ARTHROSCOPY Left 11/01/2017   Procedure: ARTHROSCOPY LEFT KNEE;  Surgeon: Melrose Nakayama, MD;  Location: [place];  Service: Orthopedics;  Laterality: Left;   knot removed from right breast     NASAL SINUS SURGERY     TONSILLECTOMY     TONSILLECTOMY AND ADENOIDECTOMY  2002   TOTAL KNEE ARTHROPLASTY  Bilateral 10/12/2016   Procedure: TOTAL KNEE BILATERAL;  Surgeon: Melrose Nakayama, MD;  Location: [place];  Service: Orthopedics;  Laterality: Bilateral;    Family History  Problem Relation Age of Onset   Heart attack Mother    Hyperlipidemia Mother    Arthritis Father    Arthritis Sister    Arthritis Sister    Heart attack Sister    Arthritis Sister    Hyperlipidemia Sister    Stroke Sister    Cancer Brother    Depression Brother    Heart attack Brother    Heart disease Brother    Hypertension Brother    Hyperlipidemia Brother    Cancer Brother    Heart attack Brother     Medications- reviewed and updated Current Outpatient Medications  Medication Sig Dispense Refill   acetic acid-hydrocortisone (VOSOL-HC) OTIC solution Place 3 drops into both ears 2 (two) times daily. 10 mL 0   acyclovir (ZOVIRAX) 400 MG tablet As needed     amoxicillin (  AMOXIL) 500 MG capsule      aspirin EC 81 MG tablet Take 81 mg by mouth daily.     Biotin w/ Vitamins C & E (HAIR/SKIN/NAILS PO) Take by mouth.     cholecalciferol (VITAMIN D) 1000 units tablet Take 1,000 Units by mouth at bedtime.     escitalopram (LEXAPRO) 20 MG tablet TAKE 1 TABLET DAILY 90 tablet 3   Ginkgo Biloba 40 MG TABS Take by mouth.     hydrochlorothiazide (MICROZIDE) 12.5 MG capsule Take 1 capsule (12.5 mg total) by mouth daily. 90 capsule 3   rosuvastatin (CRESTOR) 20 MG tablet TAKE 1 TABLET AT BEDTIME 90 tablet 3   SYNTHROID 112 MCG tablet TAKE 1 TABLET AT BEDTIME 90 tablet 3   omeprazole (PRILOSEC) 40 MG capsule Take 1 capsule (40 mg total) by mouth daily. As needed 90 capsule 3   No current facility-administered medications for this visit.    Allergies-reviewed and updated Allergies  Allergen Reactions   No Known Allergies     Social History   Socioeconomic History   Marital status: Married    Spouse name: Not on file   Number of children: Not on file   Years of education: Not on file   Highest education level:  Not on file  Occupational History   Not on file  Tobacco Use   Smoking status: Never   Smokeless tobacco: Never  Vaping Use   Vaping Use: Never used  Substance and Sexual Activity   Alcohol use: Yes    Comment: occasionally   Drug use: No   Sexual activity: Not on file  Other Topics Concern   Not on file  Social History Narrative   Not on file   Social Determinants of Health   Financial Resource Strain: Low Risk    Difficulty of Paying Living Expenses: Not hard at all  Food Insecurity: No Food Insecurity   Worried About Estate manager/land agent of Food in the Last Year: Never true   [place] in the Last Year: Never true  Transportation Needs: No Transportation Needs   Lack of Transportation (Medical): No   Lack of Transportation (Non-Medical): No  Physical Activity: Inactive   Days of Exercise per Week: 0 days   Minutes of Exercise per Session: 0 min  Stress: No Stress Concern Present   Feeling of Stress : Not at all  Social Connections: Moderately Integrated   Frequency of Communication with Friends and Family: More than three times a week   Frequency of Social Gatherings with Friends and Family: Twice a week   Attends Religious Services: More than 4 times per year   Active Member of [employer] or Organizations: No   Attends Music therapist: Never   Marital Status: Married        Objective:  Physical Exam: BP (!) 142/78 (BP Location: Left Arm)    Pulse 64    Temp 98 F (36.7 C) (Temporal)    Ht 5\' 6"  (1.676 m)    Wt 192 lb 6.4 oz (87.3 kg)    SpO2 95%    BMI 31.05 kg/m   Body mass index is 31.05 kg/m. Wt Readings from Last 3 Encounters:  02/09/22 192 lb 6.4 oz (87.3 kg)  05/04/21 189 lb (85.7 kg)  04/14/21 190 lb 3.2 oz (86.3 kg)   Gen: NAD, resting comfortably HEENT: TMs normal bilaterally. OP clear. No thyromegaly noted.  CV: RRR with no murmurs appreciated Pulm: NWOB, CTAB with no  crackles, wheezes, or rhonchi GI: Normal bowel sounds present. Soft,  Nontender, Nondistended. MSK: no edema, cyanosis, or clubbing noted Skin: warm, dry Neuro: CN2-12 grossly intact. Strength 5/5 in upper and lower extremities. Reflexes symmetric and intact bilaterally.  Psych: Normal affect and thought content      I,Savera Zaman,acting as a scribe for Dimas Chyle, MD.,have documented all relevant documentation on the behalf of Dimas Chyle, MD,as directed by  Dimas Chyle, MD while in the presence of Dimas Chyle, MD.   I, Dimas Chyle, MD, have reviewed all documentation for this visit. The documentation on 02/09/22 for the exam, diagnosis, procedures, and orders are all accurate and complete.  Algis Greenhouse. [name], MD 02/09/2022 10:22 AM

## 2022-02-09 NOTE — Assessment & Plan Note (Signed)
Check A1c. 

## 2022-02-09 NOTE — Assessment & Plan Note (Signed)
On Lexapro 20 mg daily.  We will continue.

## 2022-02-09 NOTE — Assessment & Plan Note (Signed)
Repeat CTA per radiology recommendations.

## 2022-02-09 NOTE — Patient Instructions (Signed)
It was very nice to see you today!  We need to get another CT scan to look at the nodules in your lungs.  We will check blood work today.  Please start the drops for your ears.  Please start the acid blocking medication for the next 6 weeks.  Please continue work on diet and exercise.  I will see back in 1 year for your next physical.  Come back sooner if needed.  Take care, Dr [name]  PLEASE NOTE:  If you had any lab tests please let us know if you have not heard back within a few days. You may see your results on mychart before we have a chance to review them but we will give you a call once they are reviewed by [place]. If we ordered any referrals today, please let us know if you have not heard from their office within the next week.   Please try these tips to maintain a healthy lifestyle:  Eat at least 3 REAL meals and 1-2 snacks per day.  Aim for no more than 5 hours between eating.  If you eat breakfast, please do so within one hour of getting up.   Each meal should contain half fruits/vegetables, one quarter protein, and one quarter carbs (no bigger than a computer mouse)  Cut down on sweet beverages. This includes juice, soda, and sweet tea.   Drink at least 1 glass of water with each meal and aim for at least 8 glasses per day  Exercise at least 150 minutes every week.    Preventive Care 12 Years and Older, Female Preventive care refers to lifestyle choices and visits with your health care provider that can promote health and wellness. Preventive care visits are also called wellness exams. What can I expect for my preventive care visit? Counseling Your health care provider may ask you questions about your: Medical history, including: Past medical problems. Family medical history. Pregnancy and menstrual history. History of falls. Current health, including: Memory and ability to understand (cognition). Emotional well-being. Home life and relationship  well-being. Sexual activity and sexual health. Lifestyle, including: Alcohol, nicotine or tobacco, and drug use. Access to firearms. Diet, exercise, and sleep habits. Work and work Statistician. Sunscreen use. Safety issues such as seatbelt and bike helmet use. Physical exam Your health care provider will check your: Height and weight. These may be used to calculate your BMI (body mass index). BMI is a measurement that tells if you are at a healthy weight. Waist circumference. This measures the distance around your waistline. This measurement also tells if you are at a healthy weight and may help predict your risk of certain diseases, such as type 2 diabetes and high blood pressure. Heart rate and blood pressure. Body temperature. Skin for abnormal spots. What immunizations do I need? Vaccines are usually given at various ages, according to a schedule. Your health care provider will recommend vaccines for you based on your age, medical history, and lifestyle or other factors, such as travel or where you work. What tests do I need? Screening Your health care provider may recommend screening tests for certain conditions. This may include: Lipid and cholesterol levels. Hepatitis C test. Hepatitis B test. HIV (human immunodeficiency virus) test. STI (sexually transmitted infection) testing, if you are at risk. Lung cancer screening. Colorectal cancer screening. Diabetes screening. This is done by checking your blood sugar (glucose) after you have not eaten for a while (fasting). Mammogram. Talk with your health care provider about  how often you should have regular mammograms. BRCA-related cancer screening. This may be done if you have a family history of breast, ovarian, tubal, or peritoneal cancers. Bone density scan. This is done to screen for osteoporosis. Talk with your health care provider about your test results, treatment options, and if necessary, the need for more tests. Follow  these instructions at home: Eating and drinking  Eat a diet that includes fresh fruits and vegetables, whole grains, lean protein, and low-fat dairy products. Limit your intake of foods with high amounts of sugar, saturated fats, and salt. Take vitamin and mineral supplements as recommended by your health care provider. Do not drink alcohol if your health care provider tells you not to drink. If you drink alcohol: Limit how much you have to 0-1 drink a day. Know how much alcohol is in your drink. In the [place] one drink equals one 12 oz bottle of beer (355 mL), one 5 oz glass of wine (148 mL), or one 1 oz glass of hard liquor (44 mL). Lifestyle Brush your teeth every morning and night with fluoride toothpaste. Floss one time each day. Exercise for at least 30 minutes 5 or more days each week. Do not use any products that contain nicotine or tobacco. These products include cigarettes, chewing tobacco, and vaping devices, such as e-cigarettes. If you need help quitting, ask your health care provider. Do not use drugs. If you are sexually active, practice safe sex. Use a condom or other form of protection in order to prevent STIs. Take aspirin only as told by your health care provider. Make sure that you understand how much to take and what form to take. Work with your health care provider to find out whether it is safe and beneficial for you to take aspirin daily. Ask your health care provider if you need to take a cholesterol-lowering medicine (statin). Find healthy ways to manage stress, such as: Meditation, yoga, or listening to music. Journaling. Talking to a trusted person. Spending time with friends and family. Minimize exposure to UV radiation to reduce your risk of skin cancer. Safety Always wear your seat belt while driving or riding in a vehicle. Do not drive: If you have been drinking alcohol. Do not ride with someone who has been drinking. When you are tired or distracted. While  texting. If you have been using any mind-altering substances or drugs. Wear a helmet and other protective equipment during sports activities. If you have firearms in your house, make sure you follow all gun safety procedures. What's next? Visit your health care provider once a year for an annual wellness visit. Ask your health care provider how often you should have your eyes and teeth checked. Stay up to date on all vaccines. This information is not intended to replace advice given to you by your health care provider. Make sure you discuss any questions you have with your health care provider. Document Revised: 06/10/2021 Document Reviewed: 06/10/2021 Elsevier Patient Education  [employer].

## 2022-02-09 NOTE — Assessment & Plan Note (Signed)
We will repeat CT scan per recommendations.

## 2022-02-09 NOTE — Assessment & Plan Note (Signed)
Stable on Lexapro 20 mg daily. 

## 2022-02-09 NOTE — Assessment & Plan Note (Signed)
On Crestor 20 mg daily.  Check lipids.

## 2022-02-09 NOTE — Assessment & Plan Note (Signed)
Stable.  We have tried Astelin in the past without improvement.  She has been referred to ENT in the past but does not wish to have surgery at this point.  She will let me know if she needs another referral.

## 2022-02-10 LAB — LIPID PANEL
Cholesterol: 191 mg/dL (ref 0–200)
HDL: 55.4 mg/dL (ref >39.00)
NonHDL: 135.98
Total CHOL/HDL Ratio: 3
Triglycerides: 300 mg/dL — ABNORMAL HIGH (ref 0.0–149.0)
VLDL: 60 mg/dL — ABNORMAL HIGH (ref 0.0–40.0)

## 2022-02-10 LAB — COMPREHENSIVE METABOLIC PANEL
ALT: 26 U/L (ref 0–35)
AST: 20 U/L (ref 0–37)
Albumin: 4.5 g/dL (ref 3.5–5.2)
Alkaline Phosphatase: 40 U/L (ref 39–117)
BUN: 12 mg/dL (ref 6–23)
CO2: 28 mEq/L (ref 19–32)
Calcium: 9.6 mg/dL (ref 8.4–10.5)
Chloride: 102 mEq/L (ref 96–112)
Creatinine, Ser: 0.75 mg/dL (ref 0.40–1.20)
GFR: 82.16 mL/min (ref >60.00)
Glucose, Bld: 99 mg/dL (ref 70–99)
Potassium: 3.9 mEq/L (ref 3.5–5.1)
Sodium: 142 mEq/L (ref 135–145)
Total Bilirubin: 0.3 mg/dL (ref 0.2–1.2)
Total Protein: 7.6 g/dL (ref 6.0–8.3)

## 2022-02-10 LAB — LDL CHOLESTEROL, DIRECT: Direct LDL: 95 mg/dL

## 2022-02-11 ENCOUNTER — Telehealth: Payer: Self-pay | Admitting: Family Medicine

## 2022-02-11 ENCOUNTER — Other Ambulatory Visit: Payer: Self-pay | Admitting: *Deleted

## 2022-02-11 MED ORDER — HYDROCHLOROTHIAZIDE 12.5 MG PO CAPS: 12.5000 mg | ORAL_CAPSULE | Freq: Every day | ORAL | 3 refills | Status: DC

## 2022-02-11 MED ORDER — OMEPRAZOLE 40 MG PO CPDR: 40.0000 mg | DELAYED_RELEASE_CAPSULE | Freq: Every day | ORAL | 3 refills | Status: DC

## 2022-02-11 NOTE — Telephone Encounter (Signed)
Rx send to pharmacy [facility]

## 2022-02-11 NOTE — Progress Notes (Signed)
Please inform patient of the following:  Vitamin D is low. Recommend starting vitamin D 50000IU once weekly. We can recheck in 3-6 months. Please send in new rx.   Blood sugar is borderline but stable. Triglycerides are also mildly elevated. Do not need to make any changes to her treatment plan at this time. She should continue to work on diet and exercise and we can recheck in a year or so.  Martha Orr. Martha Pain, MD 02/11/2022 9:26 AM

## 2022-02-11 NOTE — Telephone Encounter (Signed)
Patient called wanting a refill for omeprazole 40mg  and "blue pill" for BP hydrochlorothiazide (MICROZIDE) 12.5 MG capsule wants meds to be called in to [facility] - mail in order. Needs a 90day supply.

## 2022-02-15 ENCOUNTER — Other Ambulatory Visit: Payer: Self-pay | Admitting: *Deleted

## 2022-02-15 MED ORDER — VITAMIN D (ERGOCALCIFEROL) 1.25 MG (50000 UNIT) PO CAPS: 50000.0000 [IU] | ORAL_CAPSULE | ORAL | 0 refills | Status: DC

## 2022-02-26 ENCOUNTER — Other Ambulatory Visit: Payer: Self-pay

## 2022-02-26 ENCOUNTER — Ambulatory Visit (INDEPENDENT_AMBULATORY_CARE_PROVIDER_SITE_OTHER)
Admission: RE | Admit: 2022-02-26 | Discharge: 2022-02-26 | Disposition: A | Discharge status: Home / Self Care | Payer: Medicare Other | Source: Ambulatory Visit | Attending: Family Medicine | Admitting: Family Medicine

## 2022-02-26 DIAGNOSIS — Z0001 Encounter for general adult medical examination with abnormal findings: Secondary | ICD-10-CM

## 2022-02-26 DIAGNOSIS — R918 Other nonspecific abnormal finding of lung field: Secondary | ICD-10-CM

## 2022-02-26 DIAGNOSIS — I712 Thoracic aortic aneurysm, without rupture, unspecified: Secondary | ICD-10-CM | POA: Diagnosis not present

## 2022-02-26 MED ORDER — IOHEXOL 350 MG/ML SOLN
100.0000 mL | Freq: Once | INTRAVENOUS | Status: AC | PRN
  Administered 2022-02-26: 100 mL via INTRAVENOUS

## 2022-03-02 ENCOUNTER — Telehealth: Payer: Self-pay | Admitting: Family Medicine

## 2022-03-02 NOTE — Telephone Encounter (Signed)
See results note. 

## 2022-03-02 NOTE — Telephone Encounter (Signed)
Please call patient back in regard to imaging results.

## 2022-03-02 NOTE — Telephone Encounter (Signed)
Patient calling office - stated dr [name] called patient -  ?

## 2022-03-02 NOTE — Progress Notes (Signed)
Please inform patient of the following: ? ?Her CT is stable compared to the last one. She has mild dilation of her aorta that is stable and nodules in her lungs that are stable. We should repeat the scan next year for ongoing monitoring.

## 2022-03-30 ENCOUNTER — Other Ambulatory Visit: Payer: Self-pay | Admitting: Family Medicine

## 2022-06-22 ENCOUNTER — Other Ambulatory Visit: Payer: Self-pay | Admitting: Family Medicine

## 2022-09-07 ENCOUNTER — Telehealth: Payer: Self-pay | Admitting: Family Medicine

## 2022-09-07 NOTE — Telephone Encounter (Signed)
Caller states: -Patient caught a head cold from him which gave her head congestion, sneezing, and coughing - Patient took COVID test yesterday evening which was negative   Caller requests: -PCP send in a medication to help patient's symptoms   I offered and OV with PCP but pt declined.

## 2022-09-08 NOTE — Telephone Encounter (Signed)
Please schedule OV.  

## 2022-09-08 NOTE — Telephone Encounter (Signed)
Pt has refused an OV. Advised pt to call and schedule if symptoms get worse or they change their mind.

## 2022-09-20 ENCOUNTER — Encounter: Payer: Self-pay | Admitting: *Deleted

## 2022-09-27 ENCOUNTER — Ambulatory Visit: Payer: Medicare Other | Admitting: Internal Medicine

## 2022-09-27 ENCOUNTER — Encounter: Payer: Self-pay | Admitting: Internal Medicine

## 2022-09-27 VITALS — BP 113/70 | HR 76 | Temp 98.1°F | Resp 14 | Ht 66.0 in | Wt 184.6 lb

## 2022-09-27 DIAGNOSIS — G8929 Other chronic pain: Secondary | ICD-10-CM | POA: Insufficient documentation

## 2022-09-27 DIAGNOSIS — R109 Unspecified abdominal pain: Secondary | ICD-10-CM

## 2022-09-27 DIAGNOSIS — R110 Nausea: Secondary | ICD-10-CM | POA: Insufficient documentation

## 2022-09-27 HISTORY — DX: Other chronic pain: G89.29

## 2022-09-27 HISTORY — DX: Nausea: R11.0

## 2022-09-27 MED ORDER — TRAMADOL HCL 50 MG PO TABS: 50.0000 mg | ORAL_TABLET | Freq: Three times a day (TID) | ORAL | 0 refills | Status: AC | PRN

## 2022-09-27 MED ORDER — ONDANSETRON 4 MG PO TBDP: 4.0000 mg | ORAL_TABLET | Freq: Three times a day (TID) | ORAL | 0 refills | Status: DC | PRN

## 2022-09-27 NOTE — Progress Notes (Signed)
[place] at [facility]:  [phone]   Routine Medical Office Visit  Patient:  Martha Orr      Age: 66 y.o.       Sex:  female  Date:   09/27/2022  PCP:    Vivi Barrack, MD    [place] Provider: Loralee Pacas, MD  Assessment/Plan:   Martha Orr was seen today for right side abdominal pain.  Right sided abdominal pain Overview: Abdominal pain across her entire right side but no appendix or gallbladder remaining.  I think that most likely there is a partial blockage like a partial small bowel obstruction or may be a partial blockage of her gallbladder duct that is remaining that is causing the symptoms of intermittent nausea and abdominal pain on the right.  I advised her to go to the emergency room if he gets severe but we will try to get a CAT scan and basic blood work as an outpatient IV and give a few tramadol for pain management  Orders: -     [place]; Future -     CBC -     Comprehensive metabolic panel -     Amylase -     Lipase -     Lactic acid, plasma -     traMADol HCl; Take 1 tablet (50 mg total) by mouth every 8 (eight) hours as needed for up to 5 days.  Dispense: 15 tablet; Refill: 0 -     Urinalysis, Routine w reflex microscopic  Nausea Overview: Denies new medications Postmenopausal over 10 years ago No recent heartburn No recent constipation Gallbladder removed 18 years ago, and diarrhea ever since Takes pepto bismol, never maalox Has no meds at home. Takes vinegar and cranberry juice about a tablesppon daily.  - symptoms started prior to this.    Beer and tomato juice occasional Denies any bloating or gas   Orders: -     Ondansetron; Take 1 tablet (4 mg total) by mouth every 8 (eight) hours as needed for nausea or vomiting (for nausea from wegovy or other source).  Dispense: 20 tablet; Refill: 0   Doubt gastritis or ulcer due to pain lower right abdomen with tenderness too.  Doubt kidney stone due to  pain doesn't relate with urinary tract symptoms or radiate into groin.   Strongly advised ER if symptoms worsen while waiting for CT abdomen.   Return if symptoms worsen or fail to improve, for go to ER if pain uncontrolled or not holding fluids.    Subjective:   Martha Orr is a 66 y.o. female with PMH significant for: Past Medical History:  Diagnosis Date   Arthritis    Chronic back pain    buldging disc   Depression    takes Lexapro daily   Family history of adverse reaction to anesthesia    sister gets sick after anesthesia   High cholesterol    takes Crestor daily   History of colon polyps    benign   History of shingles    Hypothyroidism    takes Synthroid daily   Joint pain    Joint swelling    Microscopic hematuria    states her entire life and family is the same way   Nausea 09/27/2022     She main concern for today's visit is: Chief Complaint  Patient presents with   Right side abdominal pain    For about three weeks, intermittent (more so  consistent). Feels nauseous when eating sometimes. Already has back pain (sometimes on that right side).     Additional physician collected history: No bladder symptoms or fever            Objective:  Physical Exam: BP 113/70 (BP Location: Left Arm, Patient Position: Sitting)   Pulse 76   Temp 98.1 F (36.7 C)   Resp 14   Ht '5\' 6"'$  (1.676 m)   Wt 184 lb 9.6 oz (83.7 kg)   SpO2 95%   BMI 29.80 kg/m   She  is a polite, friendly, and genuine person Constitutional: NAD, AAO, not ill-appearing  Neuro: alert, no focal deficit obvious, articulate speech Psych: normal mood, behavior, thought content   Problem specific physical exam findings:  Right sided diffuse abdominal tenderness, no guarding or rebound.  Minimal flank involvement.

## 2022-09-28 LAB — COMPREHENSIVE METABOLIC PANEL
ALT: 29 U/L (ref 0–35)
AST: 23 U/L (ref 0–37)
Albumin: 4.2 g/dL (ref 3.5–5.2)
Alkaline Phosphatase: 41 U/L (ref 39–117)
BUN: 13 mg/dL (ref 6–23)
CO2: 29 mEq/L (ref 19–32)
Calcium: 9.4 mg/dL (ref 8.4–10.5)
Chloride: 102 mEq/L (ref 96–112)
Creatinine, Ser: 0.89 mg/dL (ref 0.40–1.20)
GFR: 66.61 mL/min (ref >60.00)
Glucose, Bld: 119 mg/dL — ABNORMAL HIGH (ref 70–99)
Potassium: 3.5 mEq/L (ref 3.5–5.1)
Sodium: 140 mEq/L (ref 135–145)
Total Bilirubin: 0.4 mg/dL (ref 0.2–1.2)
Total Protein: 7 g/dL (ref 6.0–8.3)

## 2022-09-28 LAB — CBC
HCT: 37.6 % (ref 36.0–46.0)
Hemoglobin: 13.2 g/dL (ref 12.0–15.0)
MCHC: 35 g/dL (ref 30.0–36.0)
MCV: 89.3 fl (ref 78.0–100.0)
Platelets: 228 10*3/uL (ref 150.0–400.0)
RBC: 4.21 Mil/uL (ref 3.87–5.11)
RDW: 13.1 % (ref 11.5–15.5)
WBC: 7.2 10*3/uL (ref 4.0–10.5)

## 2022-09-28 LAB — URINALYSIS, ROUTINE W REFLEX MICROSCOPIC
Bilirubin Urine: NEGATIVE
Leukocytes,Ua: NEGATIVE
Nitrite: NEGATIVE
Specific Gravity, Urine: 1.025 (ref 1.000–1.030)
Total Protein, Urine: NEGATIVE
Urine Glucose: NEGATIVE
Urobilinogen, UA: 0.2 (ref 0.0–1.0)
pH: 5.5 (ref 5.0–8.0)

## 2022-09-28 LAB — AMYLASE: Amylase: 45 U/L (ref 27–131)

## 2022-09-28 LAB — LIPASE: Lipase: 31 U/L (ref 11.0–59.0)

## 2022-09-28 NOTE — Progress Notes (Signed)
Bloodwork for abdominal pain shows some blood in urine. So maybe the problem is a kidney stone Drink lots of fluid The CT scan will show [place] whats wrong.  Kidneys are safe to have contrast

## 2022-09-29 LAB — LACTIC ACID, PLASMA: LACTIC ACID: 1.2 mmol/L (ref 0.4–1.8)

## 2022-09-30 ENCOUNTER — Telehealth: Payer: Self-pay | Admitting: Family Medicine

## 2022-09-30 NOTE — Telephone Encounter (Signed)
[place] with me.   Martha Orr. [name], MD 09/30/2022 3:01 PM

## 2022-09-30 NOTE — Telephone Encounter (Signed)
See PCP note.

## 2022-09-30 NOTE — Telephone Encounter (Signed)
Patient states she would like to transfer care from Dr. [name] to Dr. Randol Kern. Is this okay with you?

## 2022-10-02 ENCOUNTER — Ambulatory Visit (HOSPITAL_BASED_OUTPATIENT_CLINIC_OR_DEPARTMENT_OTHER)
Admission: RE | Admit: 2022-10-02 | Discharge: 2022-10-02 | Disposition: A | Discharge status: Home / Self Care | Payer: Medicare Other | Source: Ambulatory Visit | Attending: Internal Medicine | Admitting: Internal Medicine

## 2022-10-02 DIAGNOSIS — R109 Unspecified abdominal pain: Secondary | ICD-10-CM | POA: Insufficient documentation

## 2022-10-02 MED ORDER — IOHEXOL 300 MG/ML  SOLN
100.0000 mL | Freq: Once | INTRAMUSCULAR | Status: AC | PRN
  Administered 2022-10-02: 80 mL via INTRAVENOUS

## 2022-10-04 ENCOUNTER — Encounter: Payer: Self-pay | Admitting: Internal Medicine

## 2022-10-04 DIAGNOSIS — Z90710 Acquired absence of both cervix and uterus: Secondary | ICD-10-CM | POA: Insufficient documentation

## 2022-10-04 DIAGNOSIS — Z9049 Acquired absence of other specified parts of digestive tract: Secondary | ICD-10-CM

## 2022-10-04 DIAGNOSIS — K760 Fatty (change of) liver, not elsewhere classified: Secondary | ICD-10-CM

## 2022-10-04 HISTORY — DX: Acquired absence of both cervix and uterus: Z90.710

## 2022-10-04 HISTORY — DX: Acquired absence of other specified parts of digestive tract: Z90.49

## 2022-10-04 HISTORY — DX: Fatty (change of) liver, not elsewhere classified: K76.0

## 2022-10-04 NOTE — Progress Notes (Signed)
    The contrasted abdominal CAT scan did not find anything to explain her  right-sided abdominal pain/tenderness but we did find hematuria. I recommend she see you soon, maybe GI or surgery too, offering urology to start due to hematuria.   The CAT scan showed some fatty liver which I added to problems

## 2022-10-08 ENCOUNTER — Other Ambulatory Visit: Payer: Self-pay | Admitting: Internal Medicine

## 2022-10-08 ENCOUNTER — Ambulatory Visit: Payer: Medicare Other | Admitting: Internal Medicine

## 2022-10-08 ENCOUNTER — Encounter: Payer: Self-pay | Admitting: Internal Medicine

## 2022-10-08 VITALS — BP 120/71 | HR 72 | Temp 98.2°F | Resp 14 | Ht 66.0 in | Wt 183.0 lb

## 2022-10-08 DIAGNOSIS — M47816 Spondylosis without myelopathy or radiculopathy, lumbar region: Secondary | ICD-10-CM | POA: Diagnosis not present

## 2022-10-08 DIAGNOSIS — R109 Unspecified abdominal pain: Secondary | ICD-10-CM

## 2022-10-08 DIAGNOSIS — R110 Nausea: Secondary | ICD-10-CM | POA: Diagnosis not present

## 2022-10-08 MED ORDER — ROSUVASTATIN CALCIUM 40 MG PO TABS: 20.0000 mg | ORAL_TABLET | Freq: Every day | ORAL | 3 refills | Status: DC

## 2022-10-08 MED ORDER — RISAQUAD PO CAPS: 1.0000 | ORAL_CAPSULE | Freq: Every day | ORAL | Status: AC

## 2022-10-08 MED ORDER — DICYCLOMINE HCL 10 MG PO CAPS: 10.0000 mg | ORAL_CAPSULE | Freq: Three times a day (TID) | ORAL | 1 refills | Status: DC

## 2022-10-08 MED ORDER — HYDROCODONE-ACETAMINOPHEN 5-325 MG PO TABS: 1.0000 | ORAL_TABLET | Freq: Four times a day (QID) | ORAL | 0 refills | Status: DC | PRN

## 2022-10-08 NOTE — Progress Notes (Signed)
[place] [facility] [employer]:  [phone]   Routine Medical Office Visit  Patient:  Martha Orr      Age: 66 y.o.       Sex:  female  Date:   10/08/2022  PCP:    Loralee Pacas, MD    [place] Provider: Loralee Pacas, MD  Assessment/Plan:     Martha Orr was seen today for transfer of care.  I reviewed all of her medical problems today but the main issues we focused on was her persistent painful intermittent right-sided abdominal pain and nausea for which an extensive work-up thus far has failed to reveal a cause.  So this is an undiagnosed complicated potentially serious problem as it remains very painful  Right sided abdominal pain Overview: 66 year old female Abdominal pain across her entire right side but has removed uterus, appendix, and gallbladder Started 10/07/22.  Nausea and abdominal pain on the right, often occurs after eating.  Does not have any diarrhea or constipation.  Is not affected by urinating does have a history of a thoracic aortic aneurysm and hypertension no other relevant medical history CBC and CBC and CMP are all normal  She has remote history of appendix and gallbladder removal.  The abdomen and pelvis with contrast shows diffuse hypodensity of the liver assistant with fatty liver with no focal liver abnormality and no biliary dilatation.  Spleen was normal size pancreas appeared normal no surrounding inflammation  Pancreas: No focal lesion. Normal pancreatic contour. No surrounding inflammatory changes. N Bilateral kidneys enhance symmetrically. Subcentimeter hypodensities  within the right kidney too small to characterize. No hydronephrosis. No hydroureter.  No nephroureterolithiasis. The urinary bladder is unremarkable. On delayed imaging, there is no urothelial wall thickening and there are no filling defects in the opacified portions of the bilateral collecting systems or ureters. Stomach/Bowel: PO contrast reaches the colon. Stomach is  within normal limits. No evidence of bowel wall thickening or dilatation. Fatty infiltration of the colon wall suggestive of chronic  inflammatory changes. Vascular/Lymphatic: No abdominal aorta or iliac aneurysm. No abdominal, pelvic, or inguinal lymphadenopathy. Reproductive: Status post hysterectomy. No adnexal masses. Other: No intraperitoneal free fluid. No intraperitoneal free gas. No organized fluid collection.  Musculoskeletal: No abdominal wall hernia or abnormality. No suspicious lytic or blastic osseous lesions. No acute displaced fracture. Multilevel degenerative changes of the spine.  IMPRESSION: 1. Hepatic steatosis. 2. Otherwise no acute intra-abdominal or intrapelvic abnormality  patient status post cholecystectomy, hysterectomy, appendectomy.  Lab work shows urinalysis with trace hematuria but otherwise normal, lactic acid lipase amylase CBC and CBC and CMP are all normal Colonoscopy neg 02/19/2021   Assessment & Plan:  Overall start an extensive review of all the labs CAT scan and the nature of her pain, my my differential diagnosis has changed to endometriosis abdominal adhesions peptic ulcer disease inflammatory bowel disease, small intestinal bacterial overgrowth, and mesenteric ischemia.  Far less likely gluten enteropathy or lactose intolerance. My recommendations for today for treating as needed with continued pain medication, we will try an antispasmodic in case it is inflammatory bowel disease, l\a probiotic in case its small intestinal bacterial overgrowth, lets check a fecal occult blood test to make sure there is no bleeding from a possible ulcer or inflammation of the gut, and lets refer to a surgeon who can do an EGD colonoscopy and possibly lab laparoscopic abdominal evaluation Been taking Prilosec 40 mg for GI prophylaxis I am good to go ahead and get stool blood test  and stool for Helicobacter pylori We will increase the rosuvastatin in case of mesenteric ischemia and try to  get duplex evaluation with ultrasound considering her history of aortic aneurysm   Orders: -     Dicyclomine HCl; Take 1 capsule (10 mg total) by mouth 4 (four) times daily -  before meals and at bedtime.  Dispense: 60 capsule; Refill: 1 -     RisaQuad -     Rosuvastatin Calcium; Take 0.5 tablets (20 mg total) by mouth at bedtime. Replaces 20 mg dose  Dispense: 90 tablet; Refill: 3 -     US Abdomen Complete; Future -     HYDROcodone-Acetaminophen; Take 1 tablet by mouth every 6 (six) hours as needed for moderate pain or severe pain (take only for severe pain cannot have with alcohol or sedatives).  Dispense: 20 tablet; Refill: 0 -     Fecal occult blood, imunochemical; Standing -     Ambulatory referral to General Surgery -     H. pylori antigen, stool; Future  Lumbar arthropathy Overview: She has chronic low back pain and CAT scan showed degenerative arthritis of the low back October 02, 2022 also she has history of injections   Nausea Overview: Denies new medications Postmenopausal over 10 years ago and s/p hysterectomy No recent heartburn and takes ppi No recent constipation Gallbladder removed 18 years ago, and diarrhea ever since but she denies diarrhea associated with nausea and r sided pain Takes pepto bismol, never maalox, stopped tums Takes vinegar and cranberry juice about a tablespoon daily.  - symptoms started prior to this.    Beer and tomato juice occasional Denies any bloating or gas   Assessment & Plan: Continue to use home nausea medications and stay hydrated advised going to the hospital for IV fluids if she starts having a lot of vomiting      Return in about 3 weeks (around 10/29/2022) for review response to recent changes and stool tests and gluten diet.     Need to go to ER Discussion: We discussed Red Flag symptoms and signs in detail. She expressed understanding regarding what to do in case of urgent or emergency type symptoms.  She was advised to call  the office or go to ER if her condition worsens.   Follow Up Treatment Plan Discussion: She is encouraged to call or message via MyChart if she has any questions or concerns regarding our treatment plan.  No barriers to understanding were identified Additional information was provided in the AVS (see AVS) regarding the diagnosis/treatment plan for her to review and this note is shared on her MyChart for review.    Subjective:   Martha Orr is a 66 y.o. female with PMH significant for: Past Medical History:  Diagnosis Date   Arthritis    Chronic back pain    buldging disc   Depression    takes Lexapro daily   Family history of adverse reaction to anesthesia    sister gets sick after anesthesia   Hepatic steatosis 10/04/2022   High cholesterol    takes Crestor daily   History of colon polyps    benign   History of shingles    Hypothyroidism    takes Synthroid daily   Joint pain    Joint swelling    Microscopic hematuria    states her entire life and family is the same way   Nausea 09/27/2022    She is presenting today with: Chief Complaint  Patient presents  with   Transfer of care     Additional physician collected history: Abdominal pain has not changed much so she is also not having any nausea or abdominal pain right now its been coming and going Even though report not present right now it can still be very severe pain She is not feel like it connects to her urination but she does feel like it tends to come after eating She notes that she does not have any diarrhea or constipation            Objective:  Physical Exam: BP 120/71 (BP Location: Left Arm, Patient Position: Sitting)   Pulse 72   Temp 98.2 F (36.8 C) (Temporal)   Resp 14   Ht _0  (1.676 m)   Wt 183 lb (83 kg)   SpO2 92%   BMI 29.54 kg/m   She  is a polite, friendly, and genuine person Constitutional: NAD, AAO, not ill-appearing  Neuro: alert, no focal deficit obvious, articulate  speech Psych: normal mood, behavior, thought content   Problem specific physical exam findings:  She has painAnd tenderness throughout her right abdomen and epigastric area I feel like it is mostly right upper quadrant there is no guarding or rebound she does not seem dehydrated   Results:  No results found for any visits on 10/08/22.   Recent Results (from the past 2160 hour(s))  CBC     Status: None   Collection Time: 09/27/22  3:51 PM  Result Value Ref Range   WBC 7.2 4.0 - 10.5 K/uL   RBC 4.21 3.87 - 5.11 Mil/uL   Platelets 228.0 150.0 - 400.0 K/uL   Hemoglobin 13.2 12.0 - 15.0 g/dL   HCT 37.6 36.0 - 46.0 %   MCV 89.3 78.0 - 100.0 fl   MCHC 35.0 30.0 - 36.0 g/dL   RDW 13.1 11.5 - 15.5 %  Comp Met (CMET)     Status: Abnormal   Collection Time: 09/27/22  3:51 PM  Result Value Ref Range   Sodium 140 135 - 145 mEq/L   Potassium 3.5 3.5 - 5.1 mEq/L   Chloride 102 96 - 112 mEq/L   CO2 29 19 - 32 mEq/L   Glucose, Bld 119 (H) 70 - 99 mg/dL   BUN 13 6 - 23 mg/dL   Creatinine, Ser 0.89 0.40 - 1.20 mg/dL   Total Bilirubin 0.4 0.2 - 1.2 mg/dL   Alkaline Phosphatase 41 39 - 117 U/L   AST 23 0 - 37 U/L   ALT 29 0 - 35 U/L   Total Protein 7.0 6.0 - 8.3 g/dL   Albumin 4.2 3.5 - 5.2 g/dL   GFR 66.61 >60.00 mL/min    Comment: Calculated using the CKD-EPI Creatinine Equation (2021)   Calcium 9.4 8.4 - 10.5 mg/dL  Amylase     Status: None   Collection Time: 09/27/22  3:51 PM  Result Value Ref Range   Amylase 45 27 - 131 U/L  Lipase     Status: None   Collection Time: 09/27/22  3:51 PM  Result Value Ref Range   Lipase 31.0 11.0 - 59.0 U/L  Lactic acid, plasma     Status: None   Collection Time: 09/27/22  3:51 PM  Result Value Ref Range   LACTIC ACID 1.2 0.4 - 1.8 mmol/L  Urinalysis, Routine w reflex microscopic     Status: Abnormal   Collection Time: 09/27/22  3:51 PM  Result Value Ref Range   Color,  Urine YELLOW Yellow;Lt. Yellow;Straw;Dark Yellow;Amber;Green;Red;Brown    APPearance CLEAR Clear;Turbid;Slightly Cloudy;Cloudy   Specific Gravity, Urine 1.025 1.000 - 1.030   pH 5.5 5.0 - 8.0   Total Protein, Urine NEGATIVE Negative   Urine Glucose NEGATIVE Negative   Ketones, ur TRACE (A) Negative   Bilirubin Urine NEGATIVE Negative   Hgb urine dipstick SMALL (A) Negative   Urobilinogen, UA 0.2 0.0 - 1.0   Leukocytes,Ua NEGATIVE Negative   Nitrite NEGATIVE Negative   WBC, UA 0-2/hpf 0-2/hpf   RBC / HPF 3-6/hpf (A) 0-2/hpf

## 2022-10-08 NOTE — Assessment & Plan Note (Signed)
Continue to use home nausea medications and stay hydrated advised going to the hospital for IV fluids if she starts having a lot of vomiting

## 2022-10-08 NOTE — Assessment & Plan Note (Signed)
  Overall after we reviewed today my differential diagnosis has changed to endometriosis abdominal adhesions peptic ulcer disease inflammatory bowel disease, small intestinal bacterial overgrowth, and mesenteric ischemia.  Far less likely gluten enteropathy or lactose intolerance. My recommendations for today for treating are continued pain medication, we will try an antispasmodic in case it is inflammatory bowel disease, lets try a probiotic in case its small intestinal bacterial overgrowth, lets check a fecal occult blood test to make sure there is no bleeding from a possible ulcer or inflammation of the gut, and lets refer to a surgeon who can do an EGD colonoscopy and possibly lab laparoscopic abdominal evaluation Been taking Prilosec 40 mg for GI prophylaxis I am good to go ahead and get stool blood test and stool for Helicobacter

## 2022-10-08 NOTE — Patient Instructions (Addendum)
It was a pleasure seeing you today!  Today the plan is... try new meds and gluten free diet  Right sided abdominal pain Assessment & Plan:  Overall after we reviewed today my differential diagnosis has changed to endometriosis abdominal adhesions peptic ulcer disease inflammatory bowel disease, small intestinal bacterial overgrowth, and mesenteric ischemia.  Far less likely gluten enteropathy or lactose intolerance. My recommendations for today for treating are continued pain medication, we will try an antispasmodic in case it is inflammatory bowel disease, lets try a probiotic in case its small intestinal bacterial overgrowth, lets check a fecal occult blood test to make sure there is no bleeding from a possible ulcer or inflammation of the gut, and lets refer to a surgeon who can do an EGD colonoscopy and possibly lab laparoscopic abdominal evaluation Been taking Prilosec 40 mg for GI prophylaxis I am good to go ahead and get stool blood test and stool for Helicobacter   Orders: -     Dicyclomine HCl; Take 1 capsule (10 mg total) by mouth 4 (four) times daily -  before meals and at bedtime.  Dispense: 60 capsule; Refill: 1 -     RisaQuad -     Rosuvastatin Calcium; Take 0.5 tablets (20 mg total) by mouth at bedtime. Replaces 20 mg dose  Dispense: 90 tablet; Refill: 3 -     US Abdomen Complete; Future -     HYDROcodone-Acetaminophen; Take 1 tablet by mouth every 6 (six) hours as needed for moderate pain or severe pain (take only for severe pain cannot have with alcohol or sedatives).  Dispense: 20 tablet; Refill: 0 -     Ambulatory referral to General Surgery -     H. pylori antigen, stool -     Fecal occult blood, imunochemical; Standing  Lumbar arthropathy  Nausea  Gluten-Free Diet for Celiac Disease, Adult  The gluten-free diet includes all foods that do not contain gluten. Gluten is a protein that is found in wheat, rye, barley, and some other grains. Following the gluten-free diet  is the only treatment for people with celiac disease. It helps to prevent damage to the intestines and improves or eliminates the symptoms of celiac disease. Following the gluten-free diet requires some planning. It can be challenging at first, but it gets easier with time and practice. There are more gluten-free options available today than ever before. If you need help finding gluten-free foods or if you have questions, talk with your dietitian or health care provider. What are tips for following this plan? Reading food labels Read all food labels. Gluten is often added to foods. Always check the ingredient list and look for warnings that the food may contain gluten. Foods that list any of these key words on the label usually contain gluten: Wheat, flour, enriched flour, bromated flour, white flour, durum flour, graham flour, phosphated flour, self-rising flour, semolina, farina, barley (malt), rye, and oats. Starch, dextrin, modified food starch, or cereal. Thickening, fillers, or emulsifiers. Malt flavoring, malt extract, or malt syrup. Hydrolyzed vegetable protein. In the [place] packaged foods that are gluten-free are required to be labeled "GF." These foods should be easy to identify and are safe to eat. In the [place] food companies are also required to list common food allergens, including wheat, on their labels. Shopping When grocery shopping, start in the produce, meat, and dairy sections. These areas are more likely to contain gluten-free foods. Then move to the aisles that contain packaged foods if you need to. Meal  planning All fruits, vegetables, and meats are safe to eat and do not contain gluten. Talk with your dietitian or health care provider before taking a gluten-free multivitamin or mineral supplement. Be aware of gluten-free foods having contact with foods that contain gluten (cross-contamination). This can happen at home and with any processed foods. Talk with your health care  provider or dietitian about how to reduce the risk of cross-contamination in your home. If you have questions about how a food is processed, ask the manufacturer. What foods can I eat? Fruits All plain fresh, frozen, canned, and dried fruits, and 100% fruit juices. Vegetables All plain fresh, frozen, and canned vegetables, and 100% vegetable juices. Grains Amaranth, bean flours, 100% buckwheat flour, corn, millet, nut flours or nut meals, GF oats, quinoa, rice, sorghum, teff, rice wafers, pure cornmeal tortillas, popcorn, and hot cereals made from cornmeal or GF grains. Hominy, rice, and wild rice. Some Asian rice noodles or bean noodles. Arrowroot starch, corn bran, corn flour, corn germ, cornmeal, corn starch, potato flour, potato starch flour, and rice bran. Plain, brown, and sweet rice flours. Rice polish, soy flour, and tapioca starch. Meats and other protein foods All fresh beef, pork, poultry, fish, seafood, and eggs. Fish canned in water, oil, brine, or vegetable broth. Plain nuts and seeds, peanut butter.  Some precooked or cured meat, such as sausages or meat loaves. Some frankfurters. Dried beans, dried peas, and lentils. Dairy Fresh plain, dry, evaporated, or condensed milk. Cream, butter, sour cream, whipping cream, and most yogurts. Unprocessed cheese, most processed cheeses, some cottage cheeses, and some cream cheeses. Beverages Coffee, tea, and most herbal teas. Carbonated beverages and some root beers. Wine, sake, and pure distilled spirits, such as gin, vodka, and whiskey. Most hard ciders. Fats and oils Butter, margarine, vegetable oil, hydrogenated butter, olive oil, shortening, lard, cream, and some mayonnaise. Some commercial salad dressings. Olives. Sweets and desserts Sugar, honey, some syrups, molasses, jelly, and jam. Plain hard candy, marshmallows, and gumdrops.  Pure cocoa powder. Plain chocolate. Custard and some pudding mixes. Gelatin desserts, sorbets, frozen ice  pops, and sherbet.  Cake, cookies, and other desserts prepared with allowed flours. Some commercial ice creams. Cornstarch, tapioca, and rice puddings. Seasoning and other foods Some canned or frozen soups. Monosodium glutamate (MSG). Cider, rice, and wine vinegar. Baking soda and baking powder.  Cream of tartar. Baking and nutritional yeast. Certain soy sauces made without wheat. Ask your dietitian about specific brands that are allowed. Nuts, coconut, and chocolate. Salt, pepper, herbs, spices, flavoring extracts, imitation or artificial flavorings, natural flavorings, and food colorings.  Some medicines and supplements. Rice syrups. The items listed above may not be a complete list of foods and beverages you can eat and drink. Contact a dietitian for more information. What foods should I avoid? Fruits Thickened or prepared fruits and some pie fillings. Some fruit snacks and fruit roll-ups. Vegetables Most creamed vegetables and most vegetables canned in sauces. Some commercially prepared vegetables and salads. Vegetables in a soy sauce marinade or dressing. Grains Barley, bran, bulgur, couscous, cracked wheat, [place], farro, graham, malt, matzo, semolina, wheat germ, and all wheat and rye cereals, including spelt and kamut. Cereals containing malt as a flavoring, such as rice cereal. Noodles, spaghetti, macaroni, most packaged rice mixes, and all mixes containing wheat, rye, barley, or triticale. Meats and other protein foods Any meat or meat alternative containing wheat, rye, barley, or gluten stabilizers. These are often marinated or packaged meats, and precooked or cured meat,  such as sausages or meat loaves. Bread-containing products, such as Swiss steak, croquettes, meatballs, and meatloaf. Most tuna canned in vegetable broth. [place] with hydrolyzed vegetable protein (HVP) injected as part of the basting. Seitan. Imitation fish. Eggs in sauces made from ingredients to  avoid. Dairy Commercial chocolate milk drinks and malted milk. Some non-dairy creamers. Any cheese product containing ingredients to avoid. Beverages Certain cereal beverages. Beer, ale, malted milk, and some root beers. Some hard ciders. Some instant flavored coffees. Some herbal teas made with barley or with barley malt added. Fats and oils Some commercial salad dressings. Sour cream containing modified food starch. Sweets and desserts Some toffees. Chocolate-coated nuts (may be rolled in wheat flour) and some commercial candies and candy bars.  Most cakes, cookies, donuts, pastries, and other baked goods. Some commercial ice cream. Ice cream cones.  Commercially prepared mixes for cakes, cookies, and other desserts. Bread pudding and other puddings thickened with flour.  Products containing brown rice syrup made with barley malt enzyme. Desserts and sweets made with malt flavoring. Seasoning and other foods Some curry powders, some dry seasoning mixes, some gravy extracts, some meat sauces, some ketchups, some prepared mustards, and horseradish.  Certain soy sauces. Malt vinegar. Bouillon and bouillon cubes that contain HVP. Some chip dips. Some chewing gum. Yeast extract. Brewer's yeast. Caramel color.  Some medicines and supplements. The items listed above may not be a complete list of foods and beverages you should avoid. Contact a dietitian for more information. Summary Gluten is a protein that is found in wheat, rye, barley, and some other grains. The gluten-free diet includes all foods that do not contain gluten. If you need help finding gluten-free foods or if you have questions, talk with your dietitian or your health care provider. Read all food labels. Gluten is often added to foods. Always check the ingredient list and look for warnings that the food may contain gluten. This information is not intended to replace advice given to you by your health care provider. Make sure you  discuss any questions you have with your health care provider. Document Revised: 11/03/2021 Document Reviewed: 11/03/2021 Elsevier Patient Education  2023 Elsevier Inc.    Loralee Pacas, MD   No follow-ups on file.   - If your condition fails to resolve or you have other questions / concerns: please contact me via phone [phone] or MyChart messaging.  - Please bring all your medicines to your next appointment. This is the best way for me to know exactly what you're taking.  - If your condition begins to worsen or become severe:  go to the ER.   IF you received an x-ray today, you will receive an invoice from [facility]. Please contact [facility] at [phone] with questions or concerns regarding your invoice.    IF you received labwork today, you will receive an invoice from [facility]. Please contact [facility] at [phone] with questions or concerns regarding your invoice.    Our billing staff will not be able to assist you with questions regarding bills from these companies.   --------------------------------------------------------------------------------------------------------------------  You will be contacted with the lab results as soon as they are available. The fastest way to get your results is to activate your My Chart account. Instructions are located on the last page of this paperwork. If you have not heard from [place] regarding the results in 2 weeks, please contact this office. For any labs or imaging tests, we will call you if the results  are significantly abnormal.  Most normal results will be posted to myChart as soon as they are available and I will comment on them there within 2-3 business days.

## 2022-10-12 ENCOUNTER — Telehealth: Payer: Self-pay | Admitting: Internal Medicine

## 2022-10-12 NOTE — Telephone Encounter (Signed)
Left vm for Brandy to call the office back.

## 2022-10-12 NOTE — Telephone Encounter (Signed)
Martha Orr with [facility] requests to be called at ph# [phone] for clarification of RX received for rosuvastatin (CRESTOR) 40 MG tablet   Reference# [id number]

## 2022-10-12 NOTE — Telephone Encounter (Signed)
Brandy with [facility] requests to be called at ph# [phone] for clarification of an Order for Ultrasound.  Does Provider want a Duplex of the Liver or the Aorta?

## 2022-10-13 ENCOUNTER — Encounter: Payer: Medicare Other | Admitting: Internal Medicine

## 2022-10-13 ENCOUNTER — Other Ambulatory Visit: Payer: Self-pay

## 2022-10-13 DIAGNOSIS — R109 Unspecified abdominal pain: Secondary | ICD-10-CM

## 2022-10-13 NOTE — Telephone Encounter (Signed)
Spoke with [facility] and had the representative to update the dosage per Dr.Morrison.

## 2022-10-14 ENCOUNTER — Telehealth: Payer: Self-pay | Admitting: Internal Medicine

## 2022-10-14 ENCOUNTER — Ambulatory Visit
Admission: RE | Admit: 2022-10-14 | Discharge: 2022-10-14 | Disposition: A | Discharge status: Home / Self Care | Payer: Medicare Other | Source: Ambulatory Visit | Attending: Internal Medicine | Admitting: Internal Medicine

## 2022-10-14 DIAGNOSIS — R109 Unspecified abdominal pain: Secondary | ICD-10-CM

## 2022-10-14 NOTE — Telephone Encounter (Signed)
Patient states she received a message stating that [facility] has been trying to reach out to [place] unsuccessful.   Office has not received fax or phone call [facility]. Patient would like office to contact [facility]. [phone].

## 2022-10-14 NOTE — Telephone Encounter (Signed)
Spoke with Vicente Males from [facility] today and resolved this with her.

## 2022-10-15 ENCOUNTER — Other Ambulatory Visit: Payer: Self-pay | Admitting: Internal Medicine

## 2022-10-15 ENCOUNTER — Telehealth: Payer: Self-pay | Admitting: Internal Medicine

## 2022-10-15 ENCOUNTER — Other Ambulatory Visit (INDEPENDENT_AMBULATORY_CARE_PROVIDER_SITE_OTHER): Payer: Medicare Other

## 2022-10-15 DIAGNOSIS — R109 Unspecified abdominal pain: Secondary | ICD-10-CM

## 2022-10-15 DIAGNOSIS — K921 Melena: Secondary | ICD-10-CM

## 2022-10-15 LAB — FECAL OCCULT BLOOD, IMMUNOCHEMICAL: Fecal Occult Bld: POSITIVE — AB

## 2022-10-15 NOTE — Telephone Encounter (Signed)
Martha Orr with [facility] requests to be called at ph# [phone] re: Positive result IFOB

## 2022-10-15 NOTE — Telephone Encounter (Signed)
Positive result on IFOB

## 2022-10-15 NOTE — Progress Notes (Signed)
I have reviewed your recent results and, in my medical opinion:  We discussed on phone Dont take aspirin Check stool Check blood counts at our labs if you feel off If you suddenly feel worse go to er by 911.  Loralee Pacas, MD  10/15/2022 5:36 PM   Now that you have been diagnosed with gi bleeding, it's important to be aware of the following symptoms that may indicate a need for immediate medical attention:  Extreme fatigue or weakness Shortness of breath Rapid or irregular heartbeat Chest pain Dizziness or lightheadedness Pale or yellowish skin Cold hands and feet Headaches  Emergency symptoms specific to anemia include: A feeling that you're going to pass out Low blood pressure Low blood oxygen saturation, which may be measured with a pulse oximeter at home3 Loss of consciousness Please advise the patient to seek emergency care if they experience any of these symptoms, or any other symptoms that cause concern.  It's always better to be safe and get checked out by a healthcare professional if you are unsure.    When to recheck the anemia depends on how you are feeling... but if you feel fine and dont see any significant bleeding, then I'd suggest doing it in about a month.  In the meantime be sure to consistently check toilet to assess your bleeding and discuss whether to take blood thinners with me.

## 2022-10-16 LAB — H. PYLORI ANTIGEN, STOOL: H pylori Ag, Stl: NEGATIVE

## 2022-10-18 ENCOUNTER — Other Ambulatory Visit (INDEPENDENT_AMBULATORY_CARE_PROVIDER_SITE_OTHER): Payer: Medicare Other

## 2022-10-18 DIAGNOSIS — K921 Melena: Secondary | ICD-10-CM

## 2022-10-18 DIAGNOSIS — R109 Unspecified abdominal pain: Secondary | ICD-10-CM

## 2022-10-18 LAB — CBC WITH DIFFERENTIAL/PLATELET
Basophils Absolute: 0 10*3/uL (ref 0.0–0.1)
Basophils Relative: 0.4 % (ref 0.0–3.0)
Eosinophils Absolute: 0.2 10*3/uL (ref 0.0–0.7)
Eosinophils Relative: 2.8 % (ref 0.0–5.0)
HCT: 40.3 % (ref 36.0–46.0)
Hemoglobin: 13.7 g/dL (ref 12.0–15.0)
Lymphocytes Relative: 38.2 % (ref 12.0–46.0)
Lymphs Abs: 2.2 10*3/uL (ref 0.7–4.0)
MCHC: 33.9 g/dL (ref 30.0–36.0)
MCV: 89.2 fl (ref 78.0–100.0)
Monocytes Absolute: 0.6 10*3/uL (ref 0.1–1.0)
Monocytes Relative: 11.3 % (ref 3.0–12.0)
Neutro Abs: 2.7 10*3/uL (ref 1.4–7.7)
Neutrophils Relative %: 47.3 % (ref 43.0–77.0)
Platelets: 266 10*3/uL (ref 150.0–400.0)
RBC: 4.51 Mil/uL (ref 3.87–5.11)
RDW: 13.1 % (ref 11.5–15.5)
WBC: 5.7 10*3/uL (ref 4.0–10.5)

## 2022-10-19 ENCOUNTER — Other Ambulatory Visit: Payer: Medicare Other

## 2022-10-19 LAB — FECAL OCCULT BLOOD, IMMUNOCHEMICAL: Fecal Occult Bld: POSITIVE — AB

## 2022-10-20 ENCOUNTER — Other Ambulatory Visit: Payer: Self-pay

## 2022-10-20 DIAGNOSIS — K921 Melena: Secondary | ICD-10-CM

## 2022-10-20 DIAGNOSIS — R109 Unspecified abdominal pain: Secondary | ICD-10-CM

## 2022-10-20 NOTE — Progress Notes (Signed)
There is still blood in the stool arrange repeat CBC and stool test please order please

## 2022-10-21 ENCOUNTER — Other Ambulatory Visit: Payer: Medicare Other

## 2022-10-21 NOTE — Progress Notes (Signed)
I have reviewed your recent results and, in my medical opinion:  The blood in the stool, so far, must be just tiny amounts, because the blood counts are not dropping.  Please continue watching blood pressure and stools... but this portends a low risk situation where there is probably just a trickling of blood  that is persistent   Loralee Pacas, MD  10/21/2022 12:20 PM

## 2022-10-26 ENCOUNTER — Ambulatory Visit
Admission: RE | Admit: 2022-10-26 | Discharge: 2022-10-26 | Disposition: A | Discharge status: Home / Self Care | Payer: Medicare Other | Source: Ambulatory Visit | Attending: Internal Medicine | Admitting: Internal Medicine

## 2022-10-26 DIAGNOSIS — R109 Unspecified abdominal pain: Secondary | ICD-10-CM

## 2022-10-29 ENCOUNTER — Ambulatory Visit: Payer: Medicare Other | Admitting: Internal Medicine

## 2022-10-29 ENCOUNTER — Encounter: Payer: Self-pay | Admitting: Internal Medicine

## 2022-10-29 VITALS — BP 130/76 | HR 66 | Temp 98.1°F | Resp 14 | Ht 66.0 in | Wt 182.6 lb

## 2022-10-29 DIAGNOSIS — R110 Nausea: Secondary | ICD-10-CM | POA: Diagnosis not present

## 2022-10-29 DIAGNOSIS — R195 Other fecal abnormalities: Secondary | ICD-10-CM

## 2022-10-29 DIAGNOSIS — R109 Unspecified abdominal pain: Secondary | ICD-10-CM

## 2022-10-29 NOTE — Assessment & Plan Note (Signed)
Hold aspirin Look at stools for blood Signs & symptoms of anemia printed and reviewed with her

## 2022-10-29 NOTE — Patient Instructions (Addendum)
It was a pleasure seeing you today! I truly hope you feel like you received 5 star service and please let me know if there is anything I can improve.  Loralee Pacas, MD   Today the plan is... I want you to try really hard on the gluten-free diet and answer the phone calls will get back to you on the ultrasound and the referral plan and come back and see me in 2 to 4 weeks stay off of aspirin because of the blood in the stool and look at your stool and if there is any heavy bleeding or signs and symptoms of anemia and you may have to go to the ER same thing if there is really severe pain  Now that you have been diagnosed with worsening anemia, it's important to be aware of the following symptoms that may indicate a need for immediate medical attention:  Extreme fatigue or weakness Shortness of breath Rapid or irregular heartbeat Chest pain Dizziness or lightheadedness Pale or yellowish skin Cold hands and feet Headaches  Emergency symptoms specific to anemia include: A feeling that you're going to pass out Low blood pressure Low blood oxygen saturation, which may be measured with a pulse oximeter at home3 Loss of consciousness Please advise the patient to seek emergency care if they experience any of these symptoms, or any other symptoms that cause concern.  It's always better to be safe and get checked out by a healthcare professional if you are unsure.       '[x]'$  Return in about 3 months (around 01/29/2023) for review med problems, adjust meds as needed.   phentermine continue?.   '[x]'$  If your condition begins to worsen or become severe:  GO to the ER  '[x]'$  If you are not doing well: RETURN to the office sooner  '[x]'$  If you have follow-up questions / concerns: please contact me  -via phone [phone] OR MyChart messaging   '[x]'$  Please bring all your medicines to each appointment the results in 2 weeks, please contact this office.  Gluten-Free Diet for Celiac Disease,  Adult  The gluten-free diet includes all foods that do not contain gluten. Gluten is a protein that is found in wheat, rye, barley, and some other grains. Following the gluten-free diet is the only treatment for people with celiac disease. It helps to prevent damage to the intestines and improves or eliminates the symptoms of celiac disease. Following the gluten-free diet requires some planning. It can be challenging at first, but it gets easier with time and practice. There are more gluten-free options available today than ever before. If you need help finding gluten-free foods or if you have questions, talk with your dietitian or health care provider. What are tips for following this plan? Reading food labels Read all food labels. Gluten is often added to foods. Always check the ingredient list and look for warnings that the food may contain gluten. Foods that list any of these key words on the label usually contain gluten: Wheat, flour, enriched flour, bromated flour, white flour, durum flour, graham flour, phosphated flour, self-rising flour, semolina, farina, barley (malt), rye, and oats. Starch, dextrin, modified food starch, or cereal. Thickening, fillers, or emulsifiers. Malt flavoring, malt extract, or malt syrup. Hydrolyzed vegetable protein. In the [place] packaged foods that are gluten-free are required to be labeled "GF." These foods should be easy to identify and are safe to eat. In the [place] food companies are also required to list common food allergens,  including wheat, on their labels. Shopping When grocery shopping, start in the produce, meat, and dairy sections. These areas are more likely to contain gluten-free foods. Then move to the aisles that contain packaged foods if you need to. Meal planning All fruits, vegetables, and meats are safe to eat and do not contain gluten. Talk with your dietitian or health care provider before taking a gluten-free multivitamin or mineral  supplement. Be aware of gluten-free foods having contact with foods that contain gluten (cross-contamination). This can happen at home and with any processed foods. Talk with your health care provider or dietitian about how to reduce the risk of cross-contamination in your home. If you have questions about how a food is processed, ask the manufacturer. What foods can I eat? Fruits All plain fresh, frozen, canned, and dried fruits, and 100% fruit juices. Vegetables All plain fresh, frozen, and canned vegetables, and 100% vegetable juices. Grains Amaranth, bean flours, 100% buckwheat flour, corn, millet, nut flours or nut meals, GF oats, quinoa, rice, sorghum, teff, rice wafers, pure cornmeal tortillas, popcorn, and hot cereals made from cornmeal or GF grains. Hominy, rice, and wild rice. Some Asian rice noodles or bean noodles. Arrowroot starch, corn bran, corn flour, corn germ, cornmeal, corn starch, potato flour, potato starch flour, and rice bran. Plain, brown, and sweet rice flours. Rice polish, soy flour, and tapioca starch. Meats and other protein foods All fresh beef, pork, poultry, fish, seafood, and eggs. Fish canned in water, oil, brine, or vegetable broth. Plain nuts and seeds, peanut butter.  Some precooked or cured meat, such as sausages or meat loaves. Some frankfurters. Dried beans, dried peas, and lentils. Dairy Fresh plain, dry, evaporated, or condensed milk. Cream, butter, sour cream, whipping cream, and most yogurts. Unprocessed cheese, most processed cheeses, some cottage cheeses, and some cream cheeses. Beverages Coffee, tea, and most herbal teas. Carbonated beverages and some root beers. Wine, sake, and pure distilled spirits, such as gin, vodka, and whiskey. Most hard ciders. Fats and oils Butter, margarine, vegetable oil, hydrogenated butter, olive oil, shortening, lard, cream, and some mayonnaise. Some commercial salad dressings. Olives. Sweets and desserts Sugar,  honey, some syrups, molasses, jelly, and jam. Plain hard candy, marshmallows, and gumdrops.  Pure cocoa powder. Plain chocolate. Custard and some pudding mixes. Gelatin desserts, sorbets, frozen ice pops, and sherbet.  Cake, cookies, and other desserts prepared with allowed flours. Some commercial ice creams. Cornstarch, tapioca, and rice puddings. Seasoning and other foods Some canned or frozen soups. Monosodium glutamate (MSG). Cider, rice, and wine vinegar. Baking soda and baking powder.  Cream of tartar. Baking and nutritional yeast. Certain soy sauces made without wheat. Ask your dietitian about specific brands that are allowed. Nuts, coconut, and chocolate. Salt, pepper, herbs, spices, flavoring extracts, imitation or artificial flavorings, natural flavorings, and food colorings.  Some medicines and supplements. Rice syrups. The items listed above may not be a complete list of foods and beverages you can eat and drink. Contact a dietitian for more information. What foods should I avoid? Fruits Thickened or prepared fruits and some pie fillings. Some fruit snacks and fruit roll-ups. Vegetables Most creamed vegetables and most vegetables canned in sauces. Some commercially prepared vegetables and salads. Vegetables in a soy sauce marinade or dressing. Grains Barley, bran, bulgur, couscous, cracked wheat, [place], farro, graham, malt, matzo, semolina, wheat germ, and all wheat and rye cereals, including spelt and kamut. Cereals containing malt as a flavoring, such as rice cereal. Noodles, spaghetti, macaroni, most packaged  rice mixes, and all mixes containing wheat, rye, barley, or triticale. Meats and other protein foods Any meat or meat alternative containing wheat, rye, barley, or gluten stabilizers. These are often marinated or packaged meats, and precooked or cured meat, such as sausages or meat loaves. Bread-containing products, such as Swiss steak, croquettes, meatballs, and meatloaf.  Most tuna canned in vegetable broth. [place] with hydrolyzed vegetable protein (HVP) injected as part of the basting. [place]. Imitation fish. Eggs in sauces made from ingredients to avoid. Dairy Commercial chocolate milk drinks and malted milk. Some non-dairy creamers. Any cheese product containing ingredients to avoid. Beverages Certain cereal beverages. Beer, ale, malted milk, and some root beers. Some hard ciders. Some instant flavored coffees. Some herbal teas made with barley or with barley malt added. Fats and oils Some commercial salad dressings. Sour cream containing modified food starch. Sweets and desserts Some toffees. Chocolate-coated nuts (may be rolled in wheat flour) and some commercial candies and candy bars.  Most cakes, cookies, donuts, pastries, and other baked goods. Some commercial ice cream. Ice cream cones.  Commercially prepared mixes for cakes, cookies, and other desserts. Bread pudding and other puddings thickened with flour.  Products containing brown rice syrup made with barley malt enzyme. Desserts and sweets made with malt flavoring. Seasoning and other foods Some curry powders, some dry seasoning mixes, some gravy extracts, some meat sauces, some ketchups, some prepared mustards, and horseradish.  Certain soy sauces. Malt vinegar. Bouillon and bouillon cubes that contain HVP. Some chip dips. Some chewing gum. Yeast extract. Brewer's yeast. Caramel color.  Some medicines and supplements. The items listed above may not be a complete list of foods and beverages you should avoid. Contact a dietitian for more information. Summary Gluten is a protein that is found in wheat, rye, barley, and some other grains. The gluten-free diet includes all foods that do not contain gluten. If you need help finding gluten-free foods or if you have questions, talk with your dietitian or your health care provider. Read all food labels. Gluten is often added to foods. Always check the  ingredient list and look for warnings that the food may contain gluten. This information is not intended to replace advice given to you by your health care provider. Make sure you discuss any questions you have with your health care provider. Document Revised: 11/03/2021 Document Reviewed: 11/03/2021 Elsevier Patient Education  [place].

## 2022-10-29 NOTE — Assessment & Plan Note (Addendum)
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 to be removed so I do not think she needs to go to the ER for it even though it if it gets really painful I encouraged her to go ahead and go to the ER mainly for pain control it is unlikely that there is a surgical emergency although I think that the pain is coming from inflammation in her intestines because it flares after large meals and it responds to medication that prevents intestinal spasm and 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   Blood in the stools repeatedly so organ to have her hold the aspirin until she can get the colonoscopy that I have ordered I have sent a message to my referral coordinator to try to make this happen quicker  I offered to write more opioids/bentyl but she has plenty she says.  Went over all the above in detail and gave AVS handout  Working with referral coordinator to get the ultrasound results and sooner appointment with surgery or gastroenterology for colonoscopy

## 2022-10-29 NOTE — Progress Notes (Signed)
[facility] [employer]:  [phone]    Routine Medical Office Visit  Patient:  Martha Orr      Age: 66 y.o.       Sex:  female  Date:   10/29/2022  PCP:    Loralee Pacas, MD    [facility] Provider: Loralee Pacas, MD  Assessment/Plan:   [place] was seen today for 3 week follow-up and discuss abdominal ultrasound results.  Right sided abdominal pain Overview: 66 year old female Abdominal pain across her entire right side but has remote removal of uterus, appendix, and gallbladder. Pain started 10/07/22.  Nausea and abdominal pain on the right, often occurs after eating.  Does not have any diarrhea or constipation.  Does not improve with stool softening.  Seems worse after large meal.  Partial response to bentyl.   Is not affected by urinating does have a history of a thoracic aortic aneurysm and hypertension The abdomen and pelvis with contrast shows diffuse hypodensity of the liver assistant with fatty liver with no focal liver abnormality and no biliary dilatation.  Spleen was normal size pancreas appeared normal no surrounding inflammation  Pancreas: No focal lesion. Normal pancreatic contour. No surrounding inflammatory changes. N Bilateral kidneys enhance symmetrically. Subcentimeter hypodensities  within the right kidney too small to characterize. No hydronephrosis. No hydroureter.  No nephroureterolithiasis. The urinary bladder is unremarkable. On delayed imaging, there is no urothelial wall thickening and there are no filling defects in the opacified portions of the bilateral collecting systems or ureters. Stomach/Bowel: PO contrast reaches the colon. Stomach is within normal limits. No evidence of bowel wall thickening or dilatation. Fatty infiltration of the colon wall suggestive of chronic  inflammatory changes. Vascular/Lymphatic: No abdominal aorta or iliac aneurysm. No abdominal, pelvic, or inguinal lymphadenopathy. Reproductive: Status post  hysterectomy. No adnexal masses. Other: No intraperitoneal free fluid. No intraperitoneal free gas. No organized fluid collection.  Musculoskeletal: No abdominal wall hernia or abnormality. No suspicious lytic or blastic osseous lesions. No acute displaced fracture. Multilevel degenerative changes of the spine.   Lab work shows urinalysis with trace hematuria but otherwise normal, lactic acid lipase amylase CBC and CBC and CMP are all normal although FOBT is positive x 2. Colonoscopy neg 02/19/2021 referral for repeat colonoscopy eval pending- s/s anemia handout given 10/2022, despite FOBT no blood seen in stool Ultrasound 10/26/2022 to look at vascular pending results Difficulty adhering to gluten free trial.     Assessment & Plan: 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 to be removed so I do not think she needs to go to the ER for it even though it if it gets really painful I encouraged her to go ahead and go to the ER mainly for pain control it is unlikely that there is a surgical emergency although I think that the pain is coming from inflammation in her intestines because it flares after large meals and it responds to medication that prevents intestinal spasm and 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   Blood in the stools repeatedly so organ to have her hold the  aspirin until she can get the colonoscopy that I have ordered I have sent a message to my referral coordinator to try to make this happen quicker  I offered to write more opioids/bentyl but she has plenty she says.  Went over all the above in detail and gave AVS  handout  Working with referral coordinator to get the ultrasound results and sooner appointment with surgery or gastroenterology for colonoscopy   Nausea Overview: Denies new medications related Associated with r sided abdominal cramps Postmenopausal over 10 years ago and s/p hysterectomy No recent heartburn and takes ppi No recent constipation Gallbladder removed 18 years ago, and diarrhea ever since but she denies diarrhea associated with nausea and r sided pain Takes pepto bismol, never maalox, stopped tums Takes vinegar and cranberry juice about a tablespoon daily.  - symptoms started prior to this.    Beer and tomato juice occasional Denies any bloating or gas    Occult blood in stools Overview: X 2/2 in oct 2023- no blood noted in stools  Assessment & Plan: Hold aspirin Look at stools for blood Signs & symptoms of anemia printed and reviewed with her     Return in about 3 months (around 01/29/2023) for review med problems, adjust meds as needed.   phentermine continue?Marland Kitchen   Today's key discussion points - also in After Visit Summary (AVS) Common side effects, risks, benefits, and alternatives for medications and treatment plan prescribed today were discussed, and she expressed understanding of the given instructions.  Medication list was reconciled and patient instructions and summary information was documented and made available for her to review in the AVS (see AVS).  This note is also available to patient for review for accuracy and understanding. She was encouraged to contact our office by phone or message via MyChart if she has any questions or concerns regarding our treatment plan (see AVS).  No barriers to understanding were identified We discussed red flag symptoms and signs in detail and when to call the office or go to ER if her condition worsens (see AFTER VISIT SUMMARY).  She expressed understanding.    Subjective:   Martha Orr is a 66 y.o. female with  PMH significant for: Past Medical History:  Diagnosis Date   Arthritis    Chronic back pain    buldging disc   Depression    takes Lexapro daily   Family history of adverse reaction to anesthesia    sister gets sick after anesthesia   Hepatic steatosis 10/04/2022   High cholesterol    takes Crestor daily   History of colon polyps    benign   History of shingles    Hypothyroidism    takes Synthroid daily   Joint pain    Joint swelling    Microscopic hematuria    states her entire life and family is the same way   Nausea 09/27/2022     She is presenting today with: Chief Complaint  Patient presents with   3 week follow-up    For stool tests and gluten diet. Everything about the same, right side pain has improved.   Discuss abdominal ultrasound results     Additional physician collected history: See Assessment/Plan section for per problem updates to history (overview and a/p subsections) as reported by patient today.       Objective:  Physical Exam: BP 130/76 (BP Location: Right Arm, Patient Position: Sitting)   Pulse 66   Temp 98.1 F (36.7 C) (Temporal)  Resp 14   Ht _0  (1.676 m)   Wt 182 lb 9.6 oz (82.8 kg)   SpO2 96%   BMI 29.47 kg/m   She is a polite, friendly, and genuine person. Her husband is supportive and friendly. Constitutional: NAD, AAO, not ill-appearing, moist oral mucosa Neuro: alert, no focal deficit obvious, articulate speech Psych: normal mood, behavior, thought content   Problem specific physical exam findings:  Abdominal tenderness on the right but no rebound or guarding even with deep palpation  Results:  Recent Results (from the past 2160 hour(s))  CBC     Status: None   Collection Time: 09/27/22  3:51 PM  Result Value Ref Range   WBC 7.2 4.0 - 10.5 K/uL   RBC 4.21 3.87 - 5.11 Mil/uL   Platelets 228.0 150.0 - 400.0 K/uL   Hemoglobin 13.2 12.0 - 15.0 g/dL   HCT 37.6 36.0 - 46.0 %   MCV 89.3 78.0 - 100.0 fl   MCHC 35.0 30.0 -  36.0 g/dL   RDW 13.1 11.5 - 15.5 %  Comp Met (CMET)     Status: Abnormal   Collection Time: 09/27/22  3:51 PM  Result Value Ref Range   Sodium 140 135 - 145 mEq/L   Potassium 3.5 3.5 - 5.1 mEq/L   Chloride 102 96 - 112 mEq/L   CO2 29 19 - 32 mEq/L   Glucose, Bld 119 (H) 70 - 99 mg/dL   BUN 13 6 - 23 mg/dL   Creatinine, Ser 0.89 0.40 - 1.20 mg/dL   Total Bilirubin 0.4 0.2 - 1.2 mg/dL   Alkaline Phosphatase 41 39 - 117 U/L   AST 23 0 - 37 U/L   ALT 29 0 - 35 U/L   Total Protein 7.0 6.0 - 8.3 g/dL   Albumin 4.2 3.5 - 5.2 g/dL   GFR 66.61 >60.00 mL/min    Comment: Calculated using the CKD-EPI Creatinine Equation (2021)   Calcium 9.4 8.4 - 10.5 mg/dL  Amylase     Status: None   Collection Time: 09/27/22  3:51 PM  Result Value Ref Range   Amylase 45 27 - 131 U/L  Lipase     Status: None   Collection Time: 09/27/22  3:51 PM  Result Value Ref Range   Lipase 31.0 11.0 - 59.0 U/L  Lactic acid, plasma     Status: None   Collection Time: 09/27/22  3:51 PM  Result Value Ref Range   LACTIC ACID 1.2 0.4 - 1.8 mmol/L  Urinalysis, Routine w reflex microscopic     Status: Abnormal   Collection Time: 09/27/22  3:51 PM  Result Value Ref Range   Color, Urine YELLOW Yellow;Lt. Yellow;Straw;Dark Yellow;Amber;Green;Red;Brown   APPearance CLEAR Clear;Turbid;Slightly Cloudy;Cloudy   Specific Gravity, Urine 1.025 1.000 - 1.030   pH 5.5 5.0 - 8.0   Total Protein, Urine NEGATIVE Negative   Urine Glucose NEGATIVE Negative   Ketones, ur TRACE (A) Negative   Bilirubin Urine NEGATIVE Negative   Hgb urine dipstick SMALL (A) Negative   Urobilinogen, UA 0.2 0.0 - 1.0   Leukocytes,Ua NEGATIVE Negative   Nitrite NEGATIVE Negative   WBC, UA 0-2/hpf 0-2/hpf   RBC / HPF 3-6/hpf (A) 0-2/hpf  H. pylori antigen, stool     Status: None   Collection Time: 10/14/22  9:37 AM  Result Value Ref Range   H pylori Ag, Stl Negative Negative  Fecal occult blood, imunochemical([facility])     Status: Abnormal    Collection Time:  10/15/22  1:06 PM   Specimen: Stool  Result Value Ref Range   Fecal Occult Bld Positive (A) Negative  CBC with Differential/Platelet     Status: None   Collection Time: 10/18/22 11:21 AM  Result Value Ref Range   WBC 5.7 4.0 - 10.5 K/uL   RBC 4.51 3.87 - 5.11 Mil/uL   Hemoglobin 13.7 12.0 - 15.0 g/dL   HCT 40.3 36.0 - 46.0 %   MCV 89.2 78.0 - 100.0 fl   MCHC 33.9 30.0 - 36.0 g/dL   RDW 13.1 11.5 - 15.5 %   Platelets 266.0 150.0 - 400.0 K/uL   Neutrophils Relative % 47.3 43.0 - 77.0 %   Lymphocytes Relative 38.2 12.0 - 46.0 %   Monocytes Relative 11.3 3.0 - 12.0 %   Eosinophils Relative 2.8 0.0 - 5.0 %   Basophils Relative 0.4 0.0 - 3.0 %   Neutro Abs 2.7 1.4 - 7.7 K/uL   Lymphs Abs 2.2 0.7 - 4.0 K/uL   Monocytes Absolute 0.6 0.1 - 1.0 K/uL   Eosinophils Absolute 0.2 0.0 - 0.7 K/uL   Basophils Absolute 0.0 0.0 - 0.1 K/uL  Fecal occult blood, imunochemical([facility])     Status: Abnormal   Collection Time: 10/18/22  5:22 PM   Specimen: Stool  Result Value Ref Range   Fecal Occult Bld Positive (A) Negative

## 2022-11-02 ENCOUNTER — Other Ambulatory Visit: Payer: Self-pay | Admitting: Internal Medicine

## 2022-11-02 ENCOUNTER — Telehealth: Payer: Self-pay | Admitting: Internal Medicine

## 2022-11-02 ENCOUNTER — Encounter: Payer: Self-pay | Admitting: Physician Assistant

## 2022-11-02 DIAGNOSIS — R109 Unspecified abdominal pain: Secondary | ICD-10-CM

## 2022-11-02 NOTE — Telephone Encounter (Signed)
Caller States: -Imaging performed on 10/31 at [facility]. -Came for follow up appointment for results with PCP on 11/03, imaging results not available. -pt and spouse were told they were get a call back either way by Friday night (11/03)   Consulted with PCP team and will reach out to [place] directly.

## 2022-11-02 NOTE — Progress Notes (Signed)
We discussed this on the phone... It argues against a blood flow problem for the pain The pain on the right seems most likely associated with chronic colon inflammation seen on CT.  We discussed maybe trial antibiotics but she has no fever so we hold off. I asked referral coordinator to check on the GI referral, and possibly  reopen surgical referral to get sooner specialist evaluation.

## 2022-11-03 NOTE — Telephone Encounter (Signed)
We discussed that since this is helping to continue it prn

## 2022-11-15 ENCOUNTER — Ambulatory Visit: Payer: Medicare Other | Admitting: Internal Medicine

## 2022-12-08 ENCOUNTER — Ambulatory Visit: Payer: Medicare Other | Admitting: Physician Assistant

## 2022-12-08 ENCOUNTER — Encounter: Payer: Self-pay | Admitting: Physician Assistant

## 2022-12-08 VITALS — BP 118/74 | HR 82 | Ht 66.5 in | Wt 179.0 lb

## 2022-12-08 DIAGNOSIS — R110 Nausea: Secondary | ICD-10-CM

## 2022-12-08 DIAGNOSIS — Z9049 Acquired absence of other specified parts of digestive tract: Secondary | ICD-10-CM

## 2022-12-08 DIAGNOSIS — K921 Melena: Secondary | ICD-10-CM

## 2022-12-08 DIAGNOSIS — K219 Gastro-esophageal reflux disease without esophagitis: Secondary | ICD-10-CM

## 2022-12-08 DIAGNOSIS — R197 Diarrhea, unspecified: Secondary | ICD-10-CM | POA: Diagnosis not present

## 2022-12-08 DIAGNOSIS — R109 Unspecified abdominal pain: Secondary | ICD-10-CM

## 2022-12-08 DIAGNOSIS — R12 Heartburn: Secondary | ICD-10-CM

## 2022-12-08 MED ORDER — COLESTIPOL HCL 1 G PO TABS: 2.0000 g | ORAL_TABLET | Freq: Two times a day (BID) | ORAL | 3 refills | Status: DC

## 2022-12-08 MED ORDER — NA SULFATE-K SULFATE-MG SULF 17.5-3.13-1.6 GM/177ML PO SOLN: 1.0000 | ORAL | 0 refills | Status: DC

## 2022-12-08 NOTE — Patient Instructions (Signed)
We have sent the following medications to your pharmacy for you to pick up at your convenience: Suprep, Colestipol   You have been scheduled for an endoscopy and colonoscopy. Please follow the written instructions given to you at your visit today. Please pick up your prep supplies at the pharmacy within the next 1-3 days. If you use inhalers (even only as needed), please bring them with you on the day of your procedure.  Due to recent changes in healthcare laws, you may see the results of your imaging and laboratory studies on MyChart before your provider has had a chance to review them.  We understand that in some cases there may be results that are confusing or concerning to you. Not all laboratory results come back in the same time frame and the provider may be waiting for multiple results in order to interpret others.  Please give [place] 48 hours in order for your provider to thoroughly review all the results before contacting the office for clarification of your results.   _______________________________________________________  If you are age 68 or older, your body mass index should be between 23-30. Your Body mass index is 28.46 kg/m. If this is out of the aforementioned range listed, please consider follow up with your Primary Care Provider.  If you are age 68 or younger, your body mass index should be between 19-25. Your Body mass index is 28.46 kg/m. If this is out of the aformentioned range listed, please consider follow up with your Primary Care Provider.   ________________________________________________________  The [facility] providers would like to encourage you to use [facility] to communicate with providers for non-urgent requests or questions.  Due to long hold times on the telephone, sending your provider a message by [facility] may be a faster and more efficient way to get a response.  Please allow 48 business hours for a response.  Please remember that this is for non-urgent requests.   _______________________________________________________  Thank you for choosing me and [facility].  Ellouise Newer PA-C

## 2022-12-08 NOTE — Progress Notes (Signed)
Chief Complaint: Right lower quadrant pain and blood in stools  HPI:    Martha Orr is a 66 year old female with past medical history as listed below including anxiety, depression, hypertension and multiple others, who was referred to me by Loralee Pacas, MD for a complaint of right lower quadrant pain and blood in stools.      10/02/2022 CTAP with contrast showed hepatic steatosis and otherwise no acute intra-abdominal or intrapelvic abnormality.  Status postcholecystectomy, hysterectomy and appendectomy.  Did note some fatty infiltration of the colon wall suggestive of chronic inflammatory changes.    10/14/2022 H. pylori antigen in the stool was negative.    10/18/2022 fecal occult positive, also positive on 10/15/2022.  CBC normal.    10/26/2022 upper quadrant ultrasound liver Doppler showed mild hepatic steatosis and patent hepatic vasculature.    10/29/2022 patient saw PCP for right-sided abdominal pain that time discussed she had remote removal of uterus, appendix and gallbladder and the pain started 10/07/2022 with nausea after eating.  This was worse after large meal and had partial response to Bentyl.  Reviewed CT as above.  This showed fatty infiltration of the colon wall suggestive of chronic inflammatory changes.  Discussed also blood in her stools and she was told to hold Aspirin.  Also discussed some nausea.  Had occult blood testing in October which was negative.    Today, the patient presents to clinic and tells me that she has had a constant right lower quadrant/right-sided abdominal pain which seems to move around her entire right side of the abdomen off-and-on every day for a few months.  When it comes on it lasts for a few minutes but is so intense that she feels like she has to push on this area in order to get some relief.  She has not noticed this pain at night but also takes a half of a Tylenol PM due to restless leg syndrome and is pretty soundly asleep.  Along with this  asked me about her positive occult blood testing in her stool.  Her bowel movements have been loose since she had her gallbladder out though she cannot remember when this was exactly.  Tells me she will have a loose stool anytime she eats pretty much anything and that has been occurring for years and years.  Also some occasional heartburn and nausea but no vomiting.  Patient did try Dicyclomine, unsure of dosage and unsure of frequency and it maybe helped a little bit but was not very impressive.    Does tell me that she had a Cologuard which was negative last year and a prior colonoscopy about 8 years ago in [place].  Apparently was told she had a polyp at the time but they did not remove it.    Patient does report some chronic back pain but really feels like this is in her stomach.    Denies fever, chills, weight loss or seeing any bright red blood or black tarry stools.    Past Medical History:  Diagnosis Date   Anxiety    Arthritis    Chronic back pain    buldging disc   Depression    takes Lexapro daily   Family history of adverse reaction to anesthesia    sister gets sick after anesthesia   Gallstones    Hepatic steatosis 10/04/2022   High cholesterol    takes Crestor daily   History of colon polyps    benign   History of shingles  Hypertension    Hypothyroidism    takes Synthroid daily   Joint pain    Joint swelling    Microscopic hematuria    states her entire life and family is the same way   Nausea 09/27/2022    Past Surgical History:  Procedure Laterality Date   ABDOMINAL HYSTERECTOMY  1998   APPENDECTOMY     BREAST BIOPSY     BUNIONECTOMY Bilateral    CARPAL TUNNEL RELEASE Bilateral    CHOLECYSTECTOMY     COLONOSCOPY     KNEE ARTHROSCOPY Right    KNEE ARTHROSCOPY Left 11/01/2017   Procedure: ARTHROSCOPY LEFT KNEE;  Surgeon: Melrose Nakayama, MD;  Location: [facility];  Service: Orthopedics;  Laterality: Left;   knot removed from right breast     NASAL SINUS  SURGERY     TONSILLECTOMY     TONSILLECTOMY AND ADENOIDECTOMY  2002   TOTAL KNEE ARTHROPLASTY Bilateral 10/12/2016   Procedure: TOTAL KNEE BILATERAL;  Surgeon: Melrose Nakayama, MD;  Location: [facility];  Service: Orthopedics;  Laterality: Bilateral;    Current Outpatient Medications  Medication Sig Dispense Refill   acetic acid-hydrocortisone (VOSOL-HC) OTIC solution Place 3 drops into both ears 2 (two) times daily. (Patient taking differently: Place 3 drops into both ears 2 (two) times daily. PRN) 10 mL 0   acyclovir (ZOVIRAX) 400 MG tablet As needed     aspirin EC 81 MG tablet Take 81 mg by mouth daily.     Biotin w/ Vitamins C & E (HAIR/SKIN/NAILS PO) Take by mouth.     cholecalciferol (VITAMIN D) 1000 units tablet Take 1,000 Units by mouth at bedtime.     dicyclomine (BENTYL) 10 MG capsule TAKE ONE CAPSULE BY MOUTH FOUR TIMES DAILY BEFORE MEALS & AT BEDTIME 60 capsule 1   escitalopram (LEXAPRO) 20 MG tablet TAKE 1 TABLET DAILY 90 tablet 3   fluticasone (FLONASE) 50 MCG/ACT nasal spray Place 2 sprays into both nostrils daily. PRN     Ginkgo Biloba Extract 60 MG CAPS Take 1 capsule by mouth daily.     hydrochlorothiazide (MICROZIDE) 12.5 MG capsule Take 1 capsule (12.5 mg total) by mouth daily. 90 capsule 3   HYDROcodone-acetaminophen (NORCO/VICODIN) 5-325 MG tablet Take 1 tablet by mouth every 6 (six) hours as needed for moderate pain or severe pain (take only for severe pain cannot have with alcohol or sedatives). 20 tablet 0   Omega 3 1000 MG CAPS Take 1 capsule by mouth daily.     omeprazole (PRILOSEC) 40 MG capsule Take 1 capsule (40 mg total) by mouth daily. As needed 90 capsule 3   ondansetron (ZOFRAN-ODT) 4 MG disintegrating tablet Take 1 tablet (4 mg total) by mouth every 8 (eight) hours as needed for nausea or vomiting (for nausea from wegovy or other source). 20 tablet 0   rosuvastatin (CRESTOR) 40 MG tablet TAKE 0.5 TABLETS (20 MG TOTAL) BY MOUTH AT BEDTIME. REPLACES 20 MG DOSE 90  tablet 3   SYNTHROID 112 MCG tablet TAKE 1 TABLET AT BEDTIME 90 tablet 3   Vitamin D, Ergocalciferol, (DRISDOL) 1.25 MG (50000 UNIT) CAPS capsule Take 1 capsule (50,000 Units total) by mouth every 7 (seven) days. 12 capsule 0   Current Facility-Administered Medications  Medication Dose Route Frequency Provider Last Rate Last Admin   acidophilus (RISAQUAD) capsule 1 capsule  1 capsule Oral Daily Loralee Pacas, MD        Allergies as of 12/08/2022 - Review Complete 12/08/2022  Allergen Reaction Noted  No known allergies  10/11/2016    Family History  Problem Relation Age of Onset   Heart attack Mother    Hyperlipidemia Mother    Arthritis Father    Arthritis Sister    Arthritis Sister    Heart attack Sister    Arthritis Sister    Hyperlipidemia Sister    Stroke Sister    Cancer Brother    Depression Brother    Heart attack Brother    Heart disease Brother    Hypertension Brother    Hyperlipidemia Brother    Cancer Brother    Heart attack Brother     Social History   Socioeconomic History   Marital status: Married    Spouse name: Not on file   Number of children: 2   Years of education: Not on file   Highest education level: Not on file  Occupational History   Not on file  Tobacco Use   Smoking status: Never   Smokeless tobacco: Never  Vaping Use   Vaping Use: Never used  Substance and Sexual Activity   Alcohol use: Yes    Comment: occasionally   Drug use: No   Sexual activity: Not on file  Other Topics Concern   Not on file  Social History Narrative   Not on file   Social Determinants of Health   Financial Resource Strain: Low Risk  (02/08/2022)   Overall Financial Resource Strain (CARDIA)    Difficulty of Paying Living Expenses: Not hard at all  Food Insecurity: No Food Insecurity (02/08/2022)   Hunger Vital Sign    Worried About Running Out of Food in the Last Year: Never true    Ran Out of Food in the Last Year: Never true  Transportation Needs:  No Transportation Needs (02/08/2022)   PRAPARE - Hydrologist (Medical): No    Lack of Transportation (Non-Medical): No  Physical Activity: Inactive (02/08/2022)   Exercise Vital Sign    Days of Exercise per Week: 0 days    Minutes of Exercise per Session: 0 min  Stress: No Stress Concern Present (02/08/2022)   [place]    Feeling of Stress : Not at all  Social Connections: Moderately Integrated (02/08/2022)   Social Connection and Isolation Panel [NHANES]    Frequency of Communication with Friends and Family: More than three times a week    Frequency of Social Gatherings with Friends and Family: Twice a week    Attends Religious Services: More than 4 times per year    Active Member of [employer] or Organizations: No    Attends Archivist Meetings: Never    Marital Status: Married  Human resources officer Violence: Not At Risk (02/08/2022)   Humiliation, Afraid, Rape, and Kick questionnaire    Fear of Current or Ex-Partner: No    Emotionally Abused: No    Physically Abused: No    Sexually Abused: No    Review of Systems:    Constitutional: No weight loss, fever or chills Skin: No rash Cardiovascular: No chest pain Respiratory: No SOB  Gastrointestinal: See HPI and otherwise negative Genitourinary: No dysuria  Neurological: No headache, dizziness or syncope Musculoskeletal: No new muscle or joint pain Hematologic: No bruising Psychiatric: No history of depression or anxiety   Physical Exam:  Vital signs: BP 118/74   Pulse 82   Ht 5' 6.5" (1.689 m)   Wt 179 lb (81.2 kg)   BMI  28.46 kg/m    Constitutional:   Pleasant Caucasian female appears to be in NAD, Well developed, Well nourished, alert and cooperative Head:  Normocephalic and atraumatic. Eyes:   PEERL, EOMI. No icterus. Conjunctiva pink. Ears:  Normal auditory acuity. Neck:  Supple Throat: Oral cavity and pharynx without  inflammation, swelling or lesion.  Respiratory: Respirations even and unlabored. Lungs clear to auscultation bilaterally.   No wheezes, crackles, or rhonchi.  Cardiovascular: Normal S1, S2. No MRG. Regular rate and rhythm. No peripheral edema, cyanosis or pallor.  Gastrointestinal:  Soft, nondistended, mild right sided ttp. No rebound or guarding. Normal bowel sounds. No appreciable masses or hepatomegaly. Rectal:  Not performed.  Msk:  Symmetrical without gross deformities. Without edema, no deformity or joint abnormality.  Neurologic:  Alert and  oriented x4;  grossly normal neurologically.  Skin:   Dry and intact without significant lesions or rashes. Psychiatric: Demonstrates good judgement and reason without abnormal affect or behaviors.  RELEVANT LABS AND IMAGING: CBC    Component Value Date/Time   WBC 5.7 10/18/2022 1121   RBC 4.51 10/18/2022 1121   HGB 13.7 10/18/2022 1121   HCT 40.3 10/18/2022 1121   PLT 266.0 10/18/2022 1121   MCV 89.2 10/18/2022 1121   MCH 31.1 06/05/2020 1932   MCHC 33.9 10/18/2022 1121   RDW 13.1 10/18/2022 1121   LYMPHSABS 2.2 10/18/2022 1121   MONOABS 0.6 10/18/2022 1121   EOSABS 0.2 10/18/2022 1121   BASOSABS 0.0 10/18/2022 1121    CMP     Component Value Date/Time   NA 140 09/27/2022 1551   K 3.5 09/27/2022 1551   CL 102 09/27/2022 1551   CO2 29 09/27/2022 1551   GLUCOSE 119 (H) 09/27/2022 1551   BUN 13 09/27/2022 1551   CREATININE 0.89 09/27/2022 1551   CALCIUM 9.4 09/27/2022 1551   PROT 7.0 09/27/2022 1551   ALBUMIN 4.2 09/27/2022 1551   AST 23 09/27/2022 1551   ALT 29 09/27/2022 1551   ALKPHOS 41 09/27/2022 1551   BILITOT 0.4 09/27/2022 1551   GFRNONAA >60 06/05/2020 1932   GFRAA >60 06/05/2020 1932    Assessment: 1.  Right-sided abdominal pain: For the past few months it daily, comes and goes, sharp/crampy on the right side in different locations, CT showing fatty infiltration of the colon with question of chronic inflammation,  patient's last colonoscopy in [place] 8 years ago, negative Cologuard last year, minimal help from Dicyclomine, chronic back pain as well; consider IBS versus relation to colitis/IBD versus musculoskeletal 2.  Nausea: Seems to come and go somewhat infrequently, patient blames it on her eating too much food; consider gastritis versus other 3.  Heartburn: With above 4.  Diarrhea: Describes loose stools anytime she eats ever since having her gallbladder out years ago; consider IBD versus relation to status postcholecystectomy 5.  Abnormal CT of the abdomen: As above with fatty infiltration and question of chronic inflammation  Plan: 1.  Scheduled patient for diagnostic EGD and colonoscopy in the [facility] with Dr. Silverio Decamp.  Did provide the patient a detailed list of risks for the procedures and she agrees to proceed. Patient is appropriate for endoscopic procedure(s) in the ambulatory ([facility]) setting.  2.  Started the patient on Colestipol 2 g twice daily with enough refills for the next couple of months to see if diarrhea may be related to bile salts.  Discussed this in detail with her. 3.  Discussed with patient that the pain could be musculoskeletal in origin versus IBS,  if colonoscopy is unrevealing can consider these possibilities. 4.  Patient to follow in clinic per recommendations after time of procedures.  Ellouise Newer, PA-C [facility] 12/08/2022, 9:19 AM  Cc: Loralee Pacas, MD

## 2023-01-28 ENCOUNTER — Encounter: Payer: Self-pay | Admitting: Gastroenterology

## 2023-01-28 ENCOUNTER — Ambulatory Visit (AMBULATORY_SURGERY_CENTER): Payer: Medicare Other | Admitting: Gastroenterology

## 2023-01-28 VITALS — BP 124/73 | HR 61 | Temp 97.1°F | Resp 11 | Ht 66.0 in | Wt 179.0 lb

## 2023-01-28 DIAGNOSIS — R109 Unspecified abdominal pain: Secondary | ICD-10-CM

## 2023-01-28 DIAGNOSIS — R933 Abnormal findings on diagnostic imaging of other parts of digestive tract: Secondary | ICD-10-CM

## 2023-01-28 DIAGNOSIS — D120 Benign neoplasm of cecum: Secondary | ICD-10-CM | POA: Diagnosis not present

## 2023-01-28 DIAGNOSIS — R195 Other fecal abnormalities: Secondary | ICD-10-CM

## 2023-01-28 DIAGNOSIS — K5140 Inflammatory polyps of colon without complications: Secondary | ICD-10-CM

## 2023-01-28 DIAGNOSIS — D128 Benign neoplasm of rectum: Secondary | ICD-10-CM

## 2023-01-28 DIAGNOSIS — D122 Benign neoplasm of ascending colon: Secondary | ICD-10-CM | POA: Diagnosis not present

## 2023-01-28 DIAGNOSIS — K921 Melena: Secondary | ICD-10-CM

## 2023-01-28 MED ORDER — SODIUM CHLORIDE 0.9 % IV SOLN: 500.0000 mL | INTRAVENOUS | Status: DC

## 2023-01-28 NOTE — Progress Notes (Signed)
[facility] History and Physical   Primary Care Physician:  Loralee Pacas, MD   Reason for Procedure:  Heme positive stool, right side abd pain  Plan:    EGD and colonoscopy with possible interventions as needed     HPI: Martha Orr is a very pleasant 67 y.o. female here for EGD and colonoscopy for further evaluation of heme positive stool. She has abnormal CT of colon with fatty infiltration and right side abd pain   The risks and benefits as well as alternatives of endoscopic procedure(s) have been discussed and reviewed. All questions answered. The patient agrees to proceed.    Past Medical History:  Diagnosis Date   Anxiety    Arthritis    Chronic back pain    buldging disc   Depression    takes Lexapro daily   Family history of adverse reaction to anesthesia    sister gets sick after anesthesia   Gallstones    Hepatic steatosis 10/04/2022   High cholesterol    takes Crestor daily   History of colon polyps    benign   History of shingles    Hypertension    Hypothyroidism    takes Synthroid daily   Joint pain    Joint swelling    Microscopic hematuria    states her entire life and family is the same way   Nausea 09/27/2022    Past Surgical History:  Procedure Laterality Date   ABDOMINAL HYSTERECTOMY  1998   APPENDECTOMY     BREAST BIOPSY     BUNIONECTOMY Bilateral    CARPAL TUNNEL RELEASE Bilateral    CHOLECYSTECTOMY     COLONOSCOPY     KNEE ARTHROSCOPY Right    KNEE ARTHROSCOPY Left 11/01/2017   Procedure: ARTHROSCOPY LEFT KNEE;  Surgeon: Melrose Nakayama, MD;  Location: [facility];  Service: Orthopedics;  Laterality: Left;   knot removed from right breast     NASAL SINUS SURGERY     TONSILLECTOMY     TONSILLECTOMY AND ADENOIDECTOMY  2002   TOTAL KNEE ARTHROPLASTY Bilateral 10/12/2016   Procedure: TOTAL KNEE BILATERAL;  Surgeon: Melrose Nakayama, MD;  Location: [facility];  Service: Orthopedics;  Laterality: Bilateral;    Prior to  Admission medications   Medication Sig Start Date End Date Taking? Authorizing Provider  acyclovir (ZOVIRAX) 400 MG tablet As needed 07/05/19  Yes [provider]  Biotin w/ Vitamins C & E (HAIR/SKIN/NAILS PO) Take by mouth.   Yes [provider]  cholecalciferol (VITAMIN D) 1000 units tablet Take 1,000 Units by mouth at bedtime.   Yes [provider]  colestipol (COLESTID) 1 g tablet Take 2 tablets (2 g total) by mouth 2 (two) times daily. 12/08/22  Yes Levin Erp, PA  escitalopram (LEXAPRO) 20 MG tablet TAKE 1 TABLET DAILY 06/23/22  Yes Vivi Barrack, MD  fluticasone [facility]) 50 MCG/ACT nasal spray Place 2 sprays into both nostrils daily. PRN   Yes [provider]  Ginkgo Biloba Extract 60 MG CAPS Take 1 capsule by mouth daily.   Yes [provider]  hydrochlorothiazide (MICROZIDE) 12.5 MG capsule Take 1 capsule (12.5 mg total) by mouth daily. 02/11/22  Yes Vivi Barrack, MD  Na Sulfate-K Sulfate-Mg Sulf (SUPREP BOWEL PREP KIT) 17.5-3.13-1.6 GM/177ML SOLN Take 1 kit by mouth as directed. For colonoscopy prep 12/08/22  Yes Levin Erp, [place]  Omega 3 1000 MG CAPS Take 1 capsule by mouth daily.   Yes [provider]  omeprazole (PRILOSEC) 40 MG capsule Take 1 capsule (40 mg total) by mouth daily. As needed 02/11/22  Yes Vivi Barrack, MD  rosuvastatin (CRESTOR) 40 MG tablet TAKE 0.5 TABLETS (20 MG TOTAL) BY MOUTH AT BEDTIME. REPLACES 20 MG DOSE 10/11/22  Yes Loralee Pacas, MD  SYNTHROID 112 MCG tablet TAKE 1 TABLET AT BEDTIME 01/13/22  Yes Vivi Barrack, MD  Vitamin D, Ergocalciferol, (DRISDOL) 1.25 MG (50000 UNIT) CAPS capsule Take 1 capsule (50,000 Units total) by mouth every 7 (seven) days. 02/15/22  Yes Vivi Barrack, MD  aspirin EC 81 MG tablet Take 81 mg by mouth daily.    [provider]  dicyclomine (BENTYL) 10 MG capsule TAKE ONE CAPSULE BY MOUTH FOUR TIMES DAILY BEFORE MEALS & AT BEDTIME 11/03/22    Loralee Pacas, MD  HYDROcodone-acetaminophen (NORCO/VICODIN) 5-325 MG tablet Take 1 tablet by mouth every 6 (six) hours as needed for moderate pain or severe pain (take only for severe pain cannot have with alcohol or sedatives). 10/08/22   Loralee Pacas, MD  ondansetron (ZOFRAN-ODT) 4 MG disintegrating tablet Take 1 tablet (4 mg total) by mouth every 8 (eight) hours as needed for nausea or vomiting (for nausea from wegovy or other source). 09/27/22   Loralee Pacas, MD    Current Outpatient Medications  Medication Sig Dispense Refill   acyclovir (ZOVIRAX) 400 MG tablet As needed     Biotin w/ Vitamins C & E (HAIR/SKIN/NAILS PO) Take by mouth.     cholecalciferol (VITAMIN D) 1000 units tablet Take 1,000 Units by mouth at bedtime.     colestipol (COLESTID) 1 g tablet Take 2 tablets (2 g total) by mouth 2 (two) times daily. 120 tablet 3   escitalopram (LEXAPRO) 20 MG tablet TAKE 1 TABLET DAILY 90 tablet 3   fluticasone (FLONASE) 50 MCG/ACT nasal spray Place 2 sprays into both nostrils daily. PRN     Ginkgo Biloba Extract 60 MG CAPS Take 1 capsule by mouth daily.     hydrochlorothiazide (MICROZIDE) 12.5 MG capsule Take 1 capsule (12.5 mg total) by mouth daily. 90 capsule 3   Na Sulfate-K Sulfate-Mg Sulf (SUPREP BOWEL PREP KIT) 17.5-3.13-1.6 GM/177ML SOLN Take 1 kit by mouth as directed. For colonoscopy prep 354 mL 0   Omega 3 1000 MG CAPS Take 1 capsule by mouth daily.     omeprazole (PRILOSEC) 40 MG capsule Take 1 capsule (40 mg total) by mouth daily. As needed 90 capsule 3   rosuvastatin (CRESTOR) 40 MG tablet TAKE 0.5 TABLETS (20 MG TOTAL) BY MOUTH AT BEDTIME. REPLACES 20 MG DOSE 90 tablet 3   SYNTHROID 112 MCG tablet TAKE 1 TABLET AT BEDTIME 90 tablet 3   Vitamin D, Ergocalciferol, (DRISDOL) 1.25 MG (50000 UNIT) CAPS capsule Take 1 capsule (50,000 Units total) by mouth every 7 (seven) days. 12 capsule 0   aspirin EC 81 MG tablet Take 81 mg by mouth daily.     dicyclomine (BENTYL) 10 MG  capsule TAKE ONE CAPSULE BY MOUTH FOUR TIMES DAILY BEFORE MEALS & AT BEDTIME 60 capsule 1   HYDROcodone-acetaminophen (NORCO/VICODIN) 5-325 MG tablet Take 1 tablet by mouth every 6 (six) hours as needed for moderate pain or severe pain (take only for severe pain cannot have with alcohol or sedatives). 20 tablet 0   ondansetron (ZOFRAN-ODT) 4 MG disintegrating tablet Take 1 tablet (4 mg total) by mouth every 8 (eight) hours as needed for nausea or vomiting (for nausea from wegovy or  other source). 20 tablet 0   Current Facility-Administered Medications  Medication Dose Route Frequency Provider Last Rate Last Admin   0.9 %  sodium chloride infusion  500 mL Intravenous Continuous Willamae Demby V, MD       acidophilus (RISAQUAD) capsule 1 capsule  1 capsule Oral Daily Loralee Pacas, MD        Allergies as of 01/28/2023 - Review Complete 01/28/2023  Allergen Reaction Noted   No known allergies  10/11/2016    Family History  Problem Relation Age of Onset   Heart attack Mother    Hyperlipidemia Mother    Arthritis Father    Arthritis Sister    Arthritis Sister    Heart attack Sister    Arthritis Sister    Hyperlipidemia Sister    Stroke Sister    Cancer Brother    Depression Brother    Heart attack Brother    Heart disease Brother    Hypertension Brother    Hyperlipidemia Brother    Cancer Brother    Heart attack Brother    Colon cancer Neg Hx    Esophageal cancer Neg Hx    Rectal cancer Neg Hx    Stomach cancer Neg Hx     Social History   Socioeconomic History   Marital status: Married    Spouse name: Not on file   Number of children: 2   Years of education: Not on file   Highest education level: Not on file  Occupational History   Not on file  Tobacco Use   Smoking status: Never   Smokeless tobacco: Never  Vaping Use   Vaping Use: Never used  Substance and Sexual Activity   Alcohol use: Yes    Comment: occasionally   Drug use: No   Sexual activity: Not on  file  Other Topics Concern   Not on file  Social History Narrative   Not on file   Social Determinants of Health   Financial Resource Strain: Low Risk  (02/08/2022)   Overall Financial Resource Strain (CARDIA)    Difficulty of Paying Living Expenses: Not hard at all  Food Insecurity: No Food Insecurity (02/08/2022)   Hunger Vital Sign    Worried About Running Out of Food in the Last Year: Never true    Ran Out of Food in the Last Year: Never true  Transportation Needs: No Transportation Needs (02/08/2022)   PRAPARE - Hydrologist (Medical): No    Lack of Transportation (Non-Medical): No  Physical Activity: Inactive (02/08/2022)   Exercise Vital Sign    Days of Exercise per Week: 0 days    Minutes of Exercise per Session: 0 min  Stress: No Stress Concern Present (02/08/2022)   [place]    Feeling of Stress : Not at all  Social Connections: Moderately Integrated (02/08/2022)   Social Connection and Isolation Panel [NHANES]    Frequency of Communication with Friends and Family: More than three times a week    Frequency of Social Gatherings with Friends and Family: Twice a week    Attends Religious Services: More than 4 times per year    Active Member of [employer] or Organizations: No    Attends Archivist Meetings: Never    Marital Status: Married  Human resources officer Violence: Not At Risk (02/08/2022)   Humiliation, Afraid, Rape, and Kick questionnaire    Fear of Current or Ex-Partner: No  Emotionally Abused: No    Physically Abused: No    Sexually Abused: No    Review of Systems:  All other review of systems negative except as mentioned in the HPI.  Physical Exam: Vital signs in last 24 hours: Blood Pressure 121/77   Pulse 78   Temperature (Abnormal) 97.1 F (36.2 C)   Height '5\' 6"'$  (1.676 m)   Weight 179 lb (81.2 kg)   Oxygen Saturation 95%   Body Mass Index 28.89 kg/m   General:   Alert, NAD Lungs:  Clear .   Heart:  Regular rate and rhythm Abdomen:  Soft, nontender and nondistended. Neuro/Psych:  Alert and cooperative. Normal mood and affect. A and O x 3  Reviewed labs, radiology imaging, old records and pertinent past GI work up  Patient is appropriate for planned procedure(s) and anesthesia in an ambulatory setting   K. Denzil Magnuson , MD [phone]

## 2023-01-28 NOTE — Progress Notes (Signed)
Pt's states no medical or surgical changes since previsit or office visit. 

## 2023-01-28 NOTE — Op Note (Signed)
[place] Patient Name: Martha Orr Procedure Date: 01/28/2023 11:19 AM MRN: [id number] Endoscopist: Mauri Pole , MD, [id number] Age: 67 Referring MD:  Date of Birth: [birth year] Gender: Female Account #: [id number] Procedure:                Upper GI endoscopy Indications:              Suspected upper gastrointestinal bleeding,                            Gastrointestinal bleeding of unknown origin, heme                            positive stool Medicines:                Monitored Anesthesia Care Procedure:                Pre-Anesthesia Assessment:                           - Prior to the procedure, a History and Physical                            was performed, and patient medications and                            allergies were reviewed. The patient's tolerance of                            previous anesthesia was also reviewed. The risks                            and benefits of the procedure and the sedation                            options and risks were discussed with the patient.                            All questions were answered, and informed consent                            was obtained. Prior Anticoagulants: The patient has                            taken no anticoagulant or antiplatelet agents. ASA                            Grade Assessment: II - A patient with mild systemic                            disease. After reviewing the risks and benefits,                            the patient was deemed in satisfactory condition to  undergo the procedure.                           After obtaining informed consent, the endoscope was                            passed under direct vision. Throughout the                            procedure, the patient's blood pressure, pulse, and                            oxygen saturations were monitored continuously. The                            GIF [id number] was introduced  through the                            mouth, and advanced to the second part of duodenum.                            The upper GI endoscopy was accomplished without                            difficulty. The patient tolerated the procedure                            well. Scope In: Scope Out: Findings:                 The Z-line was regular and was found 38 cm from the                            incisors.                           The exam of the esophagus was otherwise normal.                           A small hiatal hernia was present.                           The stomach was normal.                           The cardia and gastric fundus were normal on                            retroflexion.                           The examined duodenum was normal. Complications:            No immediate complications. Estimated Blood Loss:     Estimated blood loss was minimal. Impression:               - Z-line regular, 38 cm from the  incisors.                           - Small hiatal hernia.                           - Normal stomach.                           - Normal examined duodenum.                           - No specimens collected. Recommendation:           - Patient has a contact number available for                            emergencies. The signs and symptoms of potential                            delayed complications were discussed with the                            patient. Return to normal activities tomorrow.                            Written discharge instructions were provided to the                            patient.                           - Resume previous diet.                           - Continue present medications.                           - See the other procedure note for documentation of                            additional recommendations. Mauri Pole, MD 01/28/2023 12:18:00 PM This report has been signed electronically.

## 2023-01-28 NOTE — Progress Notes (Signed)
Called to room to assist during endoscopic procedure.  Patient ID and intended procedure confirmed with present staff. Received instructions for my participation in the procedure from the performing physician.  

## 2023-01-28 NOTE — Op Note (Signed)
[place] Patient Name: Ronelle Michie Procedure Date: 01/28/2023 11:19 AM MRN: [id number] Endoscopist: Mauri Pole , MD, [id number] Age: 67 Referring MD:  Date of Birth: 21-Oct-1956 Gender: Female Account #: [id number] Procedure:                Colonoscopy Indications:              Evaluation of unexplained GI bleeding presenting                            with fecal occult blood Medicines:                Monitored Anesthesia Care Procedure:                Pre-Anesthesia Assessment:                           - Prior to the procedure, a History and Physical                            was performed, and patient medications and                            allergies were reviewed. The patient's tolerance of                            previous anesthesia was also reviewed. The risks                            and benefits of the procedure and the sedation                            options and risks were discussed with the patient.                            All questions were answered, and informed consent                            was obtained. Prior Anticoagulants: The patient has                            taken no anticoagulant or antiplatelet agents. ASA                            Grade Assessment: II - A patient with mild systemic                            disease. After reviewing the risks and benefits,                            the patient was deemed in satisfactory condition to                            undergo the procedure.  After obtaining informed consent, the colonoscope                            was passed under direct vision. Throughout the                            procedure, the patient's blood pressure, pulse, and                            oxygen saturations were monitored continuously. The                            [id number]-[id number] Slim SN [id number] was introduced through                            the anus and advanced to the  the cecum, identified                            by appendiceal orifice and ileocecal valve. The                            colonoscopy was performed without difficulty. The                            patient tolerated the procedure well. The quality                            of the bowel preparation was inadequate. The                            ileocecal valve, appendiceal orifice, and rectum                            were photographed. Scope In: 11:33:32 AM Scope Out: 11:57:21 AM Scope Withdrawal Time: 0 hours 18 minutes 12 seconds  Total Procedure Duration: 0 hours 23 minutes 49 seconds  Findings:                 The perianal and digital rectal examinations were                            normal.                           An 8 mm polyp was found in the rectum. The polyp                            was semi-pedunculated. The polyp was removed with a                            hot snare. Resection and retrieval were complete.                           A 10 mm polyp was found in the transverse colon.  The polyp was semi-pedunculated with ulcerated                            mucosa. The polyp was removed with a hot snare.                            Resection and retrieval were complete.                           Four sessile polyps were found in the ascending                            colon and cecum. The polyps were 3 to 6 mm in size.                            These polyps were removed with a cold snare.                            Resection and retrieval were complete.                           Non-bleeding external and internal hemorrhoids were                            found during retroflexion. The hemorrhoids were                            medium-sized. Complications:            No immediate complications. Estimated Blood Loss:     Estimated blood loss was minimal. Impression:               - Preparation of the colon was inadequate.                            - Two 9 to 11 mm polyps in the rectum and in the                            transverse colon, removed with a hot snare.                            Resected and retrieved.                           - Four 3 to 6 mm polyps in the ascending colon and                            in the cecum, removed with a cold snare. Resected                            and retrieved.                           - Non-bleeding external and internal hemorrhoids. Recommendation:           -  Patient has a contact number available for                            emergencies. The signs and symptoms of potential                            delayed complications were discussed with the                            patient. Return to normal activities tomorrow.                            Written discharge instructions were provided to the                            patient.                           - Resume previous diet.                           - Continue present medications.                           - Await pathology results.                           - Repeat colonoscopy in 1 year because the bowel                            preparation was suboptimal. Mauri Pole, MD 01/28/2023 12:14:16 PM This report has been signed electronically.

## 2023-01-28 NOTE — Progress Notes (Signed)
To pacu, VSS. Report to [email or web address] 

## 2023-01-28 NOTE — Patient Instructions (Addendum)
Please read handouts provided about hemorrhoids, hiatal hernia and polyps.   Resume previous diet. Continue present medications.  Await pathology results.  Repeat colonoscopy in 1 year because the bowel prep was suboptimal.  YOU HAD AN ENDOSCOPIC PROCEDURE TODAY AT [place]:   Refer to the procedure report that was given to you for any specific questions about what was found during the examination.  If the procedure report does not answer your questions, please call your gastroenterologist to clarify.  If you requested that your care partner not be given the details of your procedure findings, then the procedure report has been included in a sealed envelope for you to review at your convenience later.  YOU SHOULD EXPECT: Some feelings of bloating in the abdomen. Passage of more gas than usual.  Walking can help get rid of the air that was put into your GI tract during the procedure and reduce the bloating. If you had a lower endoscopy (such as a colonoscopy or flexible sigmoidoscopy) you may notice spotting of blood in your stool or on the toilet paper. If you underwent a bowel prep for your procedure, you may not have a normal bowel movement for a few days.  Please Note:  You might notice some irritation and congestion in your nose or some drainage.  This is from the oxygen used during your procedure.  There is no need for concern and it should clear up in a day or so.  SYMPTOMS TO REPORT IMMEDIATELY:  Following lower endoscopy (colonoscopy or flexible sigmoidoscopy):  Excessive amounts of blood in the stool  Significant tenderness or worsening of abdominal pains  Swelling of the abdomen that is new, acute  Fever of 100F or higher  Following upper endoscopy (EGD)  Vomiting of blood or coffee ground material  New chest pain or pain under the shoulder blades  Painful or persistently difficult swallowing  New shortness of breath  Fever of 100F or higher  Black,  tarry-looking stools  For urgent or emergent issues, a gastroenterologist can be reached at any hour by calling [phone]. Do not use MyChart messaging for urgent concerns.    DIET:  We do recommend a small meal at first, but then you may proceed to your regular diet.  Drink plenty of fluids but you should avoid alcoholic beverages for 24 hours.  ACTIVITY:  You should plan to take it easy for the rest of today and you should NOT DRIVE or use heavy machinery until tomorrow (because of the sedation medicines used during the test).    FOLLOW UP: Our staff will call the number listed on your records the next business day following your procedure.  We will call around 7:15- 8:00 am to check on you and address any questions or concerns that you may have regarding the information given to you following your procedure. If we do not reach you, we will leave a message.     If any biopsies were taken you will be contacted by phone or by letter within the next 1-3 weeks.  Please call us at [phone] if you have not heard about the biopsies in 3 weeks.    SIGNATURES/CONFIDENTIALITY: You and/or your care partner have signed paperwork which will be entered into your electronic medical record.  These signatures attest to the fact that that the information above on your After Visit Summary has been reviewed and is understood.  Full responsibility of the confidentiality of this discharge information lies with  you and/or your care-partner.

## 2023-01-31 ENCOUNTER — Telehealth: Payer: Self-pay

## 2023-01-31 NOTE — Telephone Encounter (Signed)
Post procedure follow up call, no answer 

## 2023-02-10 ENCOUNTER — Encounter: Payer: Self-pay | Admitting: Gastroenterology

## 2023-02-11 ENCOUNTER — Ambulatory Visit (INDEPENDENT_AMBULATORY_CARE_PROVIDER_SITE_OTHER): Payer: Medicare Other | Admitting: Internal Medicine

## 2023-02-11 ENCOUNTER — Encounter: Payer: Self-pay | Admitting: Internal Medicine

## 2023-02-11 VITALS — BP 114/78 | HR 85 | Temp 97.9°F | Resp 16 | Ht 66.0 in | Wt 187.4 lb

## 2023-02-11 DIAGNOSIS — Z86010 Personal history of colonic polyps: Secondary | ICD-10-CM | POA: Diagnosis not present

## 2023-02-11 DIAGNOSIS — K449 Diaphragmatic hernia without obstruction or gangrene: Secondary | ICD-10-CM | POA: Diagnosis not present

## 2023-02-11 DIAGNOSIS — R109 Unspecified abdominal pain: Secondary | ICD-10-CM | POA: Diagnosis not present

## 2023-02-11 DIAGNOSIS — K649 Unspecified hemorrhoids: Secondary | ICD-10-CM | POA: Diagnosis not present

## 2023-02-11 NOTE — Patient Instructions (Addendum)
I think your pain is from adhesions based on your negative colonoscopy and CT findings of inflammation around the colon.  I think you should continue following with your gastroenterologist and talk about capsule endoscopy and further abdomen imaging, but much more likely its adhesions and just needs surgical evaluation and possibly procedural diagnostics and treatment(s)   Encouraged continuing with fiber and water intake to keep stools soft for now. Ok to use bentyl

## 2023-02-11 NOTE — Assessment & Plan Note (Addendum)
Patients chart review and interview were used to generate a prompt for artificial intelligence analysis ([employer] artificial intelligence) clinical decision support.  AI output was reviewed and is provided in red:  Comprehensive Review of the Case: The patient is a 67 year old with a history of a thoracic aortic aneurysm and hypertension, presenting with abdominal pain across the entire right side that started 4 months ago and flares after eating. The pain partially responds to Bentyl (dicyclomine) and is not affected by urination. The patient has had a hysterectomy, appendectomy, and cholecystectomy in the past. Imaging with CT abdomen and pelvis with contrast reveals diffuse hypodensity of the liver consistent with fatty liver, no focal liver abnormality, no biliary dilatation, normal-sized spleen, normal pancreas with no surrounding inflammation, symmetrically enhancing bilateral kidneys with subcentimeter hypodensities within the right kidney too small to characterize, no hydronephrosis, no hydroureter, no nephroureterolithiasis, and an unremarkable urinary bladder. Delayed imaging shows no urothelial wall thickening and no filling defects in the opacified portions of the bilateral collecting systems or ureters. The stomach and bowel are within normal limits, with no evidence of bowel wall thickening or dilatation, and fatty infiltration of the colon wall suggestive of chronic inflammatory changes. There is no abdominal aorta or iliac aneurysm, no abdominal, pelvic, or inguinal lymphadenopathy, and no adnexal masses. There is no intraperitoneal free fluid, no intraperitoneal free gas, no organized fluid collection, and no abdominal wall hernia or abnormality. There are no suspicious lytic or blastic osseous lesions, no acute displaced fracture, and multilevel degenerative changes of the spine are noted. Lab work shows trace hematuria, normal lactic acid, lipase, amylase, CBC, and CMP, but a positive fecal  occult blood test (FOBT). An EGD was negative except for a small hiatal hernia, and a colonoscopy found only 12 polyps measuring 5-10 mm and hemorrhoids but no inflammation. The patient's medications are not listed in the summary.  Most Likely Dx: The most likely diagnosis is non-alcoholic fatty liver disease (NAFLD) with possible non-alcoholic steatohepatitis (NASH). The imaging findings of diffuse hypodensity of the liver consistent with fatty liver, in conjunction with the patient's abdominal pain that flares after eating, could be indicative of NAFLD/NASH. The partial response to Bentyl suggests a component of visceral hypersensitivity or spasm, which can be seen in NAFLD/NASH. The positive FOBT could be related to the gastrointestinal symptoms or the polyps found during colonoscopy. The absence of other significant findings on imaging and labs, including normal pancreatic enzymes, makes NAFLD/NASH a fitting diagnosis. The presence of metabolic syndrome components, such as obesity or diabetes, could further suggest this diagnosis.  Expanded DDx:  1. Chronic Mesenteric Ischemia (CMI): CMI could explain the postprandial abdominal pain due to insufficient blood flow to the intestines after eating. The patient's history of aortic aneurysm and hypertension increases the risk for vascular disease, which could include the mesenteric vessels. The normal appearance of the abdominal aorta on imaging does not rule out CMI, as significant stenosis in the mesenteric arteries could still be present. The presence of weight loss or "food fear" could further suggest this diagnosis.  2. Peptic Ulcer Disease (PUD): Despite the negative EGD, PUD could still be a consideration given the patient's abdominal pain pattern. The pain could be related to an ulcer in the duodenum or the pre-pyloric region, which may not have been visualized during the EGD. The use of nonsteroidal anti-inflammatory drugs (NSAIDs) or evidence of  Helicobacter pylori infection could further suggest this diagnosis.  Alternative DDx:  1. Gastroesophageal Reflux Disease (GERD) with hiatal  hernia: The presence of a hiatal hernia and abdominal pain that flares after eating could suggest GERD. The pain may be due to acid reflux exacerbated by the hernia, although the absence of typical reflux symptoms like heartburn or regurgitation is noted.  2. Right Renal Parenchymal Disease: The subcentimeter hypodensities within the right kidney, although too small to characterize, could be related to the patient's pain if they represent underlying renal pathology such as cysts or angiomyolipomas. The trace hematuria supports the possibility of a renal etiology.  3. Pancreatic Disease: Despite the normal appearance of the pancreas on imaging, a form of chronic pancreatitis without typical imaging findings could be considered, especially given the postprandial nature of the pain. However, the normal pancreatic enzymes make this less likely.  4. Microscopic Colitis: The fatty infiltration of the colon wall suggestive of chronic inflammatory changes could indicate microscopic colitis, which can present with abdominal pain and is often underdiagnosed due to its subtle imaging findings.  5. Irritable Bowel Syndrome (IBS): The patient's symptoms and partial response to Bentyl, a medication used for IBS, could suggest this functional gastrointestinal disorder. The absence of significant findings on imaging and labs, and the presence of chronic abdominal pain, fit the diagnosis of IBS.  6. Ischemic Nephropathy: Given the patient's history of hypertension and aortic aneurysm, ischemic nephropathy could be a consideration, especially if the subcentimeter hypodensities in the right kidney are related to ischemic changes. However, the normal renal function tests make this less likely.  7. Diverticular Disease: Although not seen on imaging, diverticular disease could  present with chronic abdominal pain and might be associated with the fatty infiltration of the colon wall. The presence of diverticula could further suggest this diagnosis.  8. Adhesions from Previous Surgeries: The patient's history of multiple abdominal surgeries raises the possibility of adhesions causing chronic pain. However, the absence of bowel obstruction on imaging makes this less likely.  9. Malignancy: While no masses or lymphadenopathy were noted on imaging, a slow-growing malignancy such as lymphoma or a small renal cell carcinoma could present with nonspecific symptoms. The presence of weight loss or night sweats could further suggest this diagnosis.  Chronic Right-Sided Abdominal Pain  The patient is a 67 year old with a history of chronic right-sided abdominal pain, exacerbated postprandially, with a history of thoracic aortic aneurysm and hypertension. The pain is partially responsive to Bentyl, suggesting a possible gastrointestinal spasm component. The absence of diarrhea or constipation and the lack of improvement with urination suggest that the gastrointestinal system is more likely involved than the urinary system. The imaging findings of diffuse hepatic steatosis without focal liver lesions, normal spleen, pancreas, kidneys, and bladder, as well as the absence of biliary dilatation, hydronephrosis, or nephroureterolithiasis, are reassuring. However, the presence of fatty infiltration of the colon wall may indicate chronic inflammatory changes. The positive fecal occult blood test (FOBT) raises concern for a gastrointestinal source of bleeding, although the recent colonoscopy only revealed polyps and hemorrhoids, which could be potential sources. The differential diagnosis includes chronic mesenteric ischemia, peptic ulcer disease, chronic pancreatitis, irritable bowel syndrome, and malignancy.  Dx  - Repeat FOBT to confirm the initial positive result. - Mesenteric angiography to  evaluate for mesenteric ischemia. - Abdominal MRI to further characterize the subcentimeter hypodensities within the right kidney and to assess for any missed pathology in the liver or pancreas. - Serum gastrin levels, if not previously measured, to rule out Zollinger-Ellison syndrome. - Consideration of a capsule endoscopy if there is a high suspicion  for small bowel pathology not visualized on prior imaging.  Tx  - Continue Bentyl as it provides partial symptom relief. - If mesenteric ischemia is diagnosed, consider surgical revascularization or angioplasty depending on the severity and location of the blockage. - For peptic ulcer disease, start a proton pump inhibitor and assess for Helicobacter pylori infection, treating if present. - If chronic pancreatitis is suspected, initiate a low-fat diet, pancreatic enzyme supplementation, and pain management. - For irritable bowel syndrome, consider dietary modifications, fiber supplements, and antispasmodics. - Address any polyps found during colonoscopy according to guidelines, which may include polypectomy and surveillance. - Manage hemorrhoids conservatively with dietary modifications, topical treatments, and possible procedural intervention if symptomatic and not responding to conservative measures. - Follow up on the subcentimeter hypodensities in the right kidney with repeat imaging if clinically indicated.   My overall impression is that she most likely has adhesions explaining the chronic R sided abdomen pain worsened sometimes by food and sometimes helped by bentyl.  Due to extensive prior surgeries, and no other explanation on esophagoduodenoscopy colonoscopy, and CT abdomen showing colonic inflammatory changes in the fat.  In AVS: I think you should continue following with your gastroenterologist and talk about capsule endoscopy and further abdomen imaging, but much more likely its adhesions and just needs surgical evaluation and possibly  procedural diagnostics and treatment(s)

## 2023-02-11 NOTE — Progress Notes (Signed)
[name]: [id number]   Routine Medical Office Visit  Patient:  Martha Orr      Age: 67 y.o.       Sex:  female  Date:   02/11/2023  PCP:    Loralee Pacas, [place] Provider: Loralee Pacas, MD   Problem Focused Charting:   Medical Decision Making per Assessment/Plan   Marletta was seen today for abdominal pain.  Right sided abdominal pain Overview: 67 year old female Abdominal pain across her entire right side but has remote removal of uterus, appendix, and gallbladder. Pain started 10/07/22.  Nausea and abdominal pain on the right, often occurs after eating.  Does not have any diarrhea or constipation.  Does not improve with stool softening.  Seems worse after large meal.  Partial response to bentyl.   Is not affected by urinating does have a history of a thoracic aortic aneurysm and hypertension The abdomen and pelvis with contrast shows diffuse hypodensity of the liver assistant with fatty liver with no focal liver abnormality and no biliary dilatation.  Spleen was normal size pancreas appeared normal no surrounding inflammation  Pancreas: No focal lesion. Normal pancreatic contour. No surrounding inflammatory changes. N Bilateral kidneys enhance symmetrically. Subcentimeter hypodensities  within the right kidney too small to characterize. No hydronephrosis. No hydroureter.  No nephroureterolithiasis. The urinary bladder is unremarkable. On delayed imaging, there is no urothelial wall thickening and there are no filling defects in the opacified portions of the bilateral collecting systems or ureters. Stomach/Bowel: PO contrast reaches the colon. Stomach is within normal limits. No evidence of bowel wall thickening or dilatation. Fatty infiltration of the colon wall suggestive of chronic  inflammatory changes. Vascular/Lymphatic: No abdominal aorta or iliac aneurysm. No abdominal, pelvic, or inguinal lymphadenopathy. Reproductive: Status post  hysterectomy. No adnexal masses. Other: No intraperitoneal free fluid. No intraperitoneal free gas. No organized fluid collection.  Musculoskeletal: No abdominal wall hernia or abnormality. No suspicious lytic or blastic osseous lesions. No acute displaced fracture. Multilevel degenerative changes of the spine.   Lab work shows urinalysis with trace hematuria but otherwise normal, lactic acid lipase amylase CBC and CBC and CMP are all normal although FOBT is positive x 2. Colonoscopy neg 02/19/2021 referral for repeat colonoscopy eval pending- s/s anemia handout given 10/2022, despite FOBT no blood seen in stool Ultrasound 10/26/2022 to look at vascular pending results  Per gastroenterology Ellouise Newer plan is esophagoduodenoscopy and colonoscopy  for a complaint of right lower quadrant pain and blood in stools.      10/02/2022 CTAP with contrast showed hepatic steatosis and otherwise no acute intra-abdominal or intrapelvic abnormality.  Status postcholecystectomy, hysterectomy and appendectomy.  Did note some fatty infiltration of the colon wall suggestive of chronic inflammatory changes.    10/14/2022 H. pylori antigen in the stool was negative.    10/18/2022 fecal occult positive, also positive on 10/15/2022.  CBC normal.    10/26/2022 upper quadrant ultrasound liver Doppler showed mild hepatic steatosis and patent hepatic vasculature.    10/29/2022 patient saw PCP for right-sided abdominal pain that time discussed she had remote removal of uterus, appendix and gallbladder and the pain started 10/07/2022 with nausea after eating.  This was worse after large meal and had partial response to Bentyl.  Reviewed CT as above.  This showed fatty infiltration of the colon wall suggestive of chronic inflammatory changes.  Discussed also blood in her stools and she was told to hold Aspirin.  Also discussed  some nausea.  Had occult blood testing in October which was negative.    Today, the patient presents to  clinic and tells me that she has had a constant right lower quadrant/right-sided abdominal pain which seems to move around her entire right side of the abdomen off-and-on every day for a few months.  When it comes on it lasts for a few minutes but is so intense that she feels like she has to push on this area in order to get some relief.  She has not noticed this pain at night but also takes a half of a Tylenol PM due to restless leg syndrome and is pretty soundly asleep.  Along with this asked me about her positive occult blood testing in her stool.  Her bowel movements have been loose since she had her gallbladder out though she cannot remember when this was exactly.  Tells me she will have a loose stool anytime she eats pretty much anything and that has been occurring for years and years.  Also some occasional heartburn and nausea but no vomiting.  Patient did try Dicyclomine, unsure of dosage and unsure of frequency and it maybe helped a little bit but was not very impressive.    Does tell me that she had a Cologuard which was negative last year and a prior colonoscopy about 8 years ago in [place].  Apparently was told she had a polyp at the time but they did not remove it.    Patient does report some chronic back pain but really feels like this is in her stomach.    Denies fever, chills, weight loss or seeing any bright red blood or black tarry stools. Difficulty adhering to gluten free trial.     Assessment & Plan: Patients chart review and interview were used to generate a prompt for artificial intelligence analysis ([employer] artificial intelligence) clinical decision support.  AI output was reviewed and is provided in red:  Comprehensive Review of the Case: The patient is a 67 year old with a history of a thoracic aortic aneurysm and hypertension, presenting with abdominal pain across the entire right side that started 4 months ago and flares after eating. The pain partially responds to  Bentyl (dicyclomine) and is not affected by urination. The patient has had a hysterectomy, appendectomy, and cholecystectomy in the past. Imaging with CT abdomen and pelvis with contrast reveals diffuse hypodensity of the liver consistent with fatty liver, no focal liver abnormality, no biliary dilatation, normal-sized spleen, normal pancreas with no surrounding inflammation, symmetrically enhancing bilateral kidneys with subcentimeter hypodensities within the right kidney too small to characterize, no hydronephrosis, no hydroureter, no nephroureterolithiasis, and an unremarkable urinary bladder. Delayed imaging shows no urothelial wall thickening and no filling defects in the opacified portions of the bilateral collecting systems or ureters. The stomach and bowel are within normal limits, with no evidence of bowel wall thickening or dilatation, and fatty infiltration of the colon wall suggestive of chronic inflammatory changes. There is no abdominal aorta or iliac aneurysm, no abdominal, pelvic, or inguinal lymphadenopathy, and no adnexal masses. There is no intraperitoneal free fluid, no intraperitoneal free gas, no organized fluid collection, and no abdominal wall hernia or abnormality. There are no suspicious lytic or blastic osseous lesions, no acute displaced fracture, and multilevel degenerative changes of the spine are noted. Lab work shows trace hematuria, normal lactic acid, lipase, amylase, CBC, and CMP, but a positive fecal occult blood test (FOBT). An EGD was negative except for a small hiatal hernia,  and a colonoscopy found only 12 polyps measuring 5-10 mm and hemorrhoids but no inflammation. The patient's medications are not listed in the summary.  Most Likely Dx: The most likely diagnosis is non-alcoholic fatty liver disease (NAFLD) with possible non-alcoholic steatohepatitis (NASH). The imaging findings of diffuse hypodensity of the liver consistent with fatty liver, in conjunction with the  patient's abdominal pain that flares after eating, could be indicative of NAFLD/NASH. The partial response to Bentyl suggests a component of visceral hypersensitivity or spasm, which can be seen in NAFLD/NASH. The positive FOBT could be related to the gastrointestinal symptoms or the polyps found during colonoscopy. The absence of other significant findings on imaging and labs, including normal pancreatic enzymes, makes NAFLD/NASH a fitting diagnosis. The presence of metabolic syndrome components, such as obesity or diabetes, could further suggest this diagnosis.  Expanded DDx:  1. Chronic Mesenteric Ischemia (CMI): CMI could explain the postprandial abdominal pain due to insufficient blood flow to the intestines after eating. The patient's history of aortic aneurysm and hypertension increases the risk for vascular disease, which could include the mesenteric vessels. The normal appearance of the abdominal aorta on imaging does not rule out CMI, as significant stenosis in the mesenteric arteries could still be present. The presence of weight loss or "food fear" could further suggest this diagnosis.  2. Peptic Ulcer Disease (PUD): Despite the negative EGD, PUD could still be a consideration given the patient's abdominal pain pattern. The pain could be related to an ulcer in the duodenum or the pre-pyloric region, which may not have been visualized during the EGD. The use of nonsteroidal anti-inflammatory drugs (NSAIDs) or evidence of Helicobacter pylori infection could further suggest this diagnosis.  Alternative DDx:  1. Gastroesophageal Reflux Disease (GERD) with hiatal hernia: The presence of a hiatal hernia and abdominal pain that flares after eating could suggest GERD. The pain may be due to acid reflux exacerbated by the hernia, although the absence of typical reflux symptoms like heartburn or regurgitation is noted.  2. Right Renal Parenchymal Disease: The subcentimeter hypodensities within the  right kidney, although too small to characterize, could be related to the patient's pain if they represent underlying renal pathology such as cysts or angiomyolipomas. The trace hematuria supports the possibility of a renal etiology.  3. Pancreatic Disease: Despite the normal appearance of the pancreas on imaging, a form of chronic pancreatitis without typical imaging findings could be considered, especially given the postprandial nature of the pain. However, the normal pancreatic enzymes make this less likely.  4. Microscopic Colitis: The fatty infiltration of the colon wall suggestive of chronic inflammatory changes could indicate microscopic colitis, which can present with abdominal pain and is often underdiagnosed due to its subtle imaging findings.  5. Irritable Bowel Syndrome (IBS): The patient's symptoms and partial response to Bentyl, a medication used for IBS, could suggest this functional gastrointestinal disorder. The absence of significant findings on imaging and labs, and the presence of chronic abdominal pain, fit the diagnosis of IBS.  6. Ischemic Nephropathy: Given the patient's history of hypertension and aortic aneurysm, ischemic nephropathy could be a consideration, especially if the subcentimeter hypodensities in the right kidney are related to ischemic changes. However, the normal renal function tests make this less likely.  7. Diverticular Disease: Although not seen on imaging, diverticular disease could present with chronic abdominal pain and might be associated with the fatty infiltration of the colon wall. The presence of diverticula could further suggest this diagnosis.  8. Adhesions from  Previous Surgeries: The patient's history of multiple abdominal surgeries raises the possibility of adhesions causing chronic pain. However, the absence of bowel obstruction on imaging makes this less likely.  9. Malignancy: While no masses or lymphadenopathy were noted on imaging, a  slow-growing malignancy such as lymphoma or a small renal cell carcinoma could present with nonspecific symptoms. The presence of weight loss or night sweats could further suggest this diagnosis.  Chronic Right-Sided Abdominal Pain  The patient is a 67 year old with a history of chronic right-sided abdominal pain, exacerbated postprandially, with a history of thoracic aortic aneurysm and hypertension. The pain is partially responsive to Bentyl, suggesting a possible gastrointestinal spasm component. The absence of diarrhea or constipation and the lack of improvement with urination suggest that the gastrointestinal system is more likely involved than the urinary system. The imaging findings of diffuse hepatic steatosis without focal liver lesions, normal spleen, pancreas, kidneys, and bladder, as well as the absence of biliary dilatation, hydronephrosis, or nephroureterolithiasis, are reassuring. However, the presence of fatty infiltration of the colon wall may indicate chronic inflammatory changes. The positive fecal occult blood test (FOBT) raises concern for a gastrointestinal source of bleeding, although the recent colonoscopy only revealed polyps and hemorrhoids, which could be potential sources. The differential diagnosis includes chronic mesenteric ischemia, peptic ulcer disease, chronic pancreatitis, irritable bowel syndrome, and malignancy.  Dx  - Repeat FOBT to confirm the initial positive result. - Mesenteric angiography to evaluate for mesenteric ischemia. - Abdominal MRI to further characterize the subcentimeter hypodensities within the right kidney and to assess for any missed pathology in the liver or pancreas. - Serum gastrin levels, if not previously measured, to rule out Zollinger-Ellison syndrome. - Consideration of a capsule endoscopy if there is a high suspicion for small bowel pathology not visualized on prior imaging.  Tx  - Continue Bentyl as it provides partial symptom  relief. - If mesenteric ischemia is diagnosed, consider surgical revascularization or angioplasty depending on the severity and location of the blockage. - For peptic ulcer disease, start a proton pump inhibitor and assess for Helicobacter pylori infection, treating if present. - If chronic pancreatitis is suspected, initiate a low-fat diet, pancreatic enzyme supplementation, and pain management. - For irritable bowel syndrome, consider dietary modifications, fiber supplements, and antispasmodics. - Address any polyps found during colonoscopy according to guidelines, which may include polypectomy and surveillance. - Manage hemorrhoids conservatively with dietary modifications, topical treatments, and possible procedural intervention if symptomatic and not responding to conservative measures. - Follow up on the subcentimeter hypodensities in the right kidney with repeat imaging if clinically indicated.   My overall impression is that she most likely has adhesions explaining the chronic R sided abdomen pain worsened sometimes by food and sometimes helped by bentyl.  Due to extensive prior surgeries, and no explanation on esophagoduodenoscopy colonoscopy, and CT abdomen showing colonic  I think you should continue following with your gastroenterologist and talk about capsule endoscopy and further abdomen imaging, but much more likely its adhesions and just needs surgical evaluation.  Orders: -     Ambulatory referral to General Surgery  Hiatal hernia Overview: Small, on 01/2023 esophagoduodenoscopy incidental finding.   History of colon polyps Overview: Removed 01/2023 colonoscopy    Hemorrhoids, unspecified hemorrhoid type Overview: Moderate size on 01/2023 colonoscopy       Subjective - Clinical Presentation:   Martha Orr is a 67 y.o. female  Patient Active Problem List   Diagnosis Date Noted   Hiatal hernia 02/11/2023  History of colon polyps 02/11/2023   Hemorrhoid  02/11/2023   Occult blood in stools 10/29/2022   Lumbar arthropathy 10/08/2022   History of cholecystectomy 10/04/2022   History of appendectomy 10/04/2022   History of hysterectomy 10/04/2022   Hepatic steatosis 10/04/2022   Nausea 09/27/2022   Right sided abdominal pain 09/27/2022   Thoracic aortic aneurysm ([place]) 02/09/2022   Allergic rhinitis 02/09/2022   Hematuria 05/04/2021   Essential hypertension 02/06/2021   Hyperglycemia 02/06/2021   Cough, persistent 05/21/2020   Gastroesophageal reflux disease without esophagitis 05/21/2020   Globus pharyngeus 05/21/2020   Pulmonary nodules 04/23/2020   Otalgia of both ears 04/23/2020   Major depressive disorder, single episode, in remission ([place]) 08/02/2019   Anxiety 08/02/2019   Dyslipidemia 08/02/2019   Hypothyroidism 08/02/2019   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   Osteoarthritis 08/02/2019   Restless legs 08/02/2019   Bilateral primary osteoarthritis of knee 10/12/2016   Past Medical History:  Diagnosis Date   Anxiety    Arthritis    Chronic back pain    buldging disc   Depression    takes Lexapro daily   Family history of adverse reaction to anesthesia    sister gets sick after anesthesia   Gallstones    Hepatic steatosis 10/04/2022   High cholesterol    takes Crestor daily   History of colon polyps    benign   History of shingles    Hypertension    Hypothyroidism    takes Synthroid daily   Joint pain    Joint swelling    Microscopic hematuria    states her entire life and family is the same way   Nausea 09/27/2022    Outpatient Medications Prior to Visit  Medication Sig   acyclovir (ZOVIRAX) 400 MG tablet As needed   aspirin EC 81 MG tablet Take 81 mg by mouth daily.   Biotin w/ Vitamins C & E (HAIR/SKIN/NAILS PO) Take by mouth.   cholecalciferol (VITAMIN D) 1000 units tablet Take 1,000 Units by mouth at bedtime.   colestipol (COLESTID) 1 g tablet Take 2 tablets (2 g total) by mouth 2 (two) times daily.    dicyclomine (BENTYL) 10 MG capsule TAKE ONE CAPSULE BY MOUTH FOUR TIMES DAILY BEFORE MEALS & AT BEDTIME   escitalopram (LEXAPRO) 20 MG tablet TAKE 1 TABLET DAILY   fluticasone (FLONASE) 50 MCG/ACT nasal spray Place 2 sprays into both nostrils daily. PRN   Ginkgo Biloba Extract 60 MG CAPS Take 1 capsule by mouth daily.   hydrochlorothiazide (MICROZIDE) 12.5 MG capsule Take 1 capsule (12.5 mg total) by mouth daily.   HYDROcodone-acetaminophen (NORCO/VICODIN) 5-325 MG tablet Take 1 tablet by mouth every 6 (six) hours as needed for moderate pain or severe pain (take only for severe pain cannot have with alcohol or sedatives).   Na Sulfate-K Sulfate-Mg Sulf (SUPREP BOWEL PREP KIT) 17.5-3.13-1.6 GM/177ML SOLN Take 1 kit by mouth as directed. For colonoscopy prep   Omega 3 1000 MG CAPS Take 1 capsule by mouth daily.   omeprazole (PRILOSEC) 40 MG capsule Take 1 capsule (40 mg total) by mouth daily. As needed   ondansetron (ZOFRAN-ODT) 4 MG disintegrating tablet Take 1 tablet (4 mg total) by mouth every 8 (eight) hours as needed for nausea or vomiting (for nausea from wegovy or other source).   rosuvastatin (CRESTOR) 40 MG tablet TAKE 0.5 TABLETS (20 MG TOTAL) BY MOUTH AT BEDTIME. REPLACES 20 MG DOSE   SYNTHROID 112 MCG tablet TAKE 1 TABLET AT BEDTIME  Vitamin D, Ergocalciferol, (DRISDOL) 1.25 MG (50000 UNIT) CAPS capsule Take 1 capsule (50,000 Units total) by mouth every 7 (seven) days.   Facility-Administered Medications Prior to Visit  Medication Dose Route Frequency Provider   acidophilus (RISAQUAD) capsule 1 capsule  1 capsule Oral Daily Loralee Pacas, MD    Chief Complaint  Patient presents with   Abdominal Pain    HPI  Patient reports completed colonoscopy and esophagoduodenoscopy, would like to review We went over the results together- many polyps and small hiatal hernia and hemorrhoids R sided abdomen pain after eating persisting, about the same Essentially, initial gastrointestinal  workup didn't find cause        Objective:  Physical Exam  BP 114/78   Pulse 85   Temp 97.9 F (36.6 C) (Temporal)   Resp 16   Ht 5' 6"$  (1.676 m)   Wt 187 lb 6.4 oz (85 kg)   SpO2 93%   BMI 30.25 kg/m  Obese  by BMI criteria but truncal adiposity (waist circumference or caliper) should be used instead. Wt Readings from Last 10 Encounters:  02/11/23 187 lb 6.4 oz (85 kg)  01/28/23 179 lb (81.2 kg)  12/08/22 179 lb (81.2 kg)  10/29/22 182 lb 9.6 oz (82.8 kg)  10/08/22 183 lb (83 kg)  09/27/22 184 lb 9.6 oz (83.7 kg)  02/09/22 192 lb 6.4 oz (87.3 kg)  05/04/21 189 lb (85.7 kg)  04/14/21 190 lb 3.2 oz (86.3 kg)  04/10/21 186 lb (84.4 kg)   Vital signs reviewed.  Nursing notes reviewed. Weight trend reviewed. General Appearance:  Well developed, well nourished female in no acute distress.   Normal work of breathing at rest Musculoskeletal: All extremities are intact.  Neurological:  Awake, alert,  No obvious focal neurological deficits or cognitive impairments Psychiatric:  Appropriate mood, pleasant demeanor Problem-specific findings:  comfortable appearing at rest today      Signed: Loralee Pacas, MD 02/11/2023 12:50 PM

## 2023-02-17 ENCOUNTER — Telehealth: Payer: Self-pay

## 2023-02-17 NOTE — Telephone Encounter (Signed)
Called patient to schedule Medicare Annual Wellness Visit (AWV). No voicemail available to leave a message.  Last date of AWV: 02/08/22  Please schedule an appointment at any time with [place].  Norton Blizzard, [place] ([facility])  [place] [facility] [phone]

## 2023-02-27 ENCOUNTER — Other Ambulatory Visit: Payer: Self-pay | Admitting: Family Medicine

## 2023-03-03 ENCOUNTER — Other Ambulatory Visit: Payer: Self-pay

## 2023-03-03 DIAGNOSIS — R109 Unspecified abdominal pain: Secondary | ICD-10-CM

## 2023-03-17 ENCOUNTER — Ambulatory Visit: Payer: Medicare Other | Admitting: General Surgery

## 2023-03-17 ENCOUNTER — Encounter: Payer: Self-pay | Admitting: General Surgery

## 2023-03-17 VITALS — BP 121/85 | HR 79 | Temp 98.4°F | Resp 16 | Ht 66.0 in | Wt 187.0 lb

## 2023-03-17 DIAGNOSIS — G8929 Other chronic pain: Secondary | ICD-10-CM | POA: Diagnosis not present

## 2023-03-17 DIAGNOSIS — R1031 Right lower quadrant pain: Secondary | ICD-10-CM

## 2023-03-17 NOTE — Progress Notes (Signed)
[facility] History and Physical  Reason for Referral: Chronic lower abdominal pain Referring Physician: Loralee Pacas, MD   Chief Complaint   New Patient (Initial Visit)     Martha Orr is a 67 y.o. female.  HPI: [name] is a 67 yo who reports having pain in the right lower abdomen for over the past 6 months. She denies any associated nausea, vomiting or issues with constipation or diarrhea. She has regular Bms. She says that the pain is random and is very severe at times. The pain goes from dull to sharp and will occur with sleeping, moving, or eating. She has been seen by GI and had a colonoscopy with polyp removal but no visualization of the ileum, and EGD with hiatal hernia.   The patient has had a prior history of appendectomy that was open and cholecystectomy that was laparoscopic all over 20 years ago. She had a hysterectomy in the time since.  She is here today with her husband. They are very frustrated and upset about why this is occurring. She has been seen by her PCP Dr. Randol Kern who has tried to think of any possibility.  She had a CT a/p in 09/2022 without any obvious signs of obstruction or hernia. She did have a odd report of "fatty infiltration of the colon" which radiologist reported could be associated inflammation but no inflammation or changes on her colonoscopy reported.   Dr. Randol Kern had question the potential of chronic mesenteric ischemic but she reports no food fear to me, no weight loss and a CTA chest from 01/2022 shows widely patent celiac and SMA so I think this is unlikely.   GI reports that this could be musculoskeletal or IBS type symptoms. She was referred to me to see if there is any cause for pain from any adhesions or her prior surgeries.   Past Medical History:  Diagnosis Date   Anxiety    Arthritis    Chronic back pain    buldging disc   Depression    takes Lexapro daily   Family history of adverse reaction to  anesthesia    sister gets sick after anesthesia   Gallstones    Hepatic steatosis 10/04/2022   High cholesterol    takes Crestor daily   History of colon polyps    benign   History of shingles    Hypertension    Hypothyroidism    takes Synthroid daily   Joint pain    Joint swelling    Microscopic hematuria    states her entire life and family is the same way   Nausea 09/27/2022    Past Surgical History:  Procedure Laterality Date   ABDOMINAL HYSTERECTOMY  1998   APPENDECTOMY     BREAST BIOPSY     BUNIONECTOMY Bilateral    CARPAL TUNNEL RELEASE Bilateral    CHOLECYSTECTOMY     COLONOSCOPY     KNEE ARTHROSCOPY Right    KNEE ARTHROSCOPY Left 11/01/2017   Procedure: ARTHROSCOPY LEFT KNEE;  Surgeon: Melrose Nakayama, MD;  Location: [place];  Service: Orthopedics;  Laterality: Left;   knot removed from right breast     NASAL SINUS SURGERY     TONSILLECTOMY     TONSILLECTOMY AND ADENOIDECTOMY  2002   TOTAL KNEE ARTHROPLASTY Bilateral 10/12/2016   Procedure: TOTAL KNEE BILATERAL;  Surgeon: Melrose Nakayama, MD;  Location: [place];  Service: Orthopedics;  Laterality: Bilateral;    Family History  Problem Relation Age of  Onset   Heart attack Mother    Hyperlipidemia Mother    Arthritis Father    Arthritis Sister    Arthritis Sister    Heart attack Sister    Arthritis Sister    Hyperlipidemia Sister    Stroke Sister    Cancer Brother    Depression Brother    Heart attack Brother    Heart disease Brother    Hypertension Brother    Hyperlipidemia Brother    Cancer Brother    Heart attack Brother    Colon cancer Neg Hx    Esophageal cancer Neg Hx    Rectal cancer Neg Hx    Stomach cancer Neg Hx     Social History   Tobacco Use   Smoking status: Never   Smokeless tobacco: Never  Vaping Use   Vaping Use: Never used  Substance Use Topics   Alcohol use: Yes    Comment: occasionally   Drug use: No    Medications: I have reviewed the patient's current  medications. Allergies as of 03/17/2023       Reactions   No Known Allergies         Medication List        Accurate as of March 17, 2023  2:13 PM. If you have any questions, ask your nurse or doctor.          STOP taking these medications    aspirin EC 81 MG tablet Stopped by: Virl Cagey, MD   colestipol 1 g tablet Commonly known as: Colestid Stopped by: Virl Cagey, MD   HYDROcodone-acetaminophen 5-325 MG tablet Commonly known as: NORCO/VICODIN Stopped by: Virl Cagey, MD   Na Sulfate-K Sulfate-Mg Sulf 17.5-3.13-1.6 GM/177ML Soln Commonly known as: Suprep Bowel Prep Kit Stopped by: Virl Cagey, MD       TAKE these medications    acyclovir 400 MG tablet Commonly known as: ZOVIRAX As needed   cholecalciferol 1000 units tablet Commonly known as: VITAMIN D Take 1,000 Units by mouth at bedtime.   dicyclomine 10 MG capsule Commonly known as: BENTYL TAKE ONE CAPSULE BY MOUTH FOUR TIMES DAILY BEFORE MEALS & AT BEDTIME   escitalopram 20 MG tablet Commonly known as: LEXAPRO TAKE 1 TABLET DAILY   fluticasone 50 MCG/ACT nasal spray Commonly known as: FLONASE Place 2 sprays into both nostrils daily. PRN   Ginkgo Biloba Extract 60 MG Caps Take 1 capsule by mouth daily.   HAIR/SKIN/NAILS PO Take by mouth.   hydrochlorothiazide 12.5 MG capsule Commonly known as: MICROZIDE TAKE 1 CAPSULE DAILY   Omega 3 1000 MG Caps Take 1 capsule by mouth daily.   omeprazole 40 MG capsule Commonly known as: PRILOSEC TAKE 1 CAPSULE DAILY AS    NEEDED   ondansetron 4 MG disintegrating tablet Commonly known as: ZOFRAN-ODT Take 1 tablet (4 mg total) by mouth every 8 (eight) hours as needed for nausea or vomiting (for nausea from wegovy or other source).   rosuvastatin 40 MG tablet Commonly known as: CRESTOR TAKE 0.5 TABLETS (20 MG TOTAL) BY MOUTH AT BEDTIME. REPLACES 20 MG DOSE   Synthroid 112 MCG tablet Generic drug: levothyroxine TAKE 1  TABLET AT BEDTIME   Vitamin D (Ergocalciferol) 1.25 MG (50000 UNIT) Caps capsule Commonly known as: DRISDOL Take 1 capsule (50,000 Units total) by mouth every 7 (seven) days.         ROS:  A comprehensive review of systems was negative except for: Gastrointestinal: positive for abdominal pain  Musculoskeletal: positive for back pain  Blood pressure 121/85, pulse 79, temperature 98.4 F (36.9 C), temperature source Oral, resp. rate 16, height 5\' 6"  (1.676 m), weight 187 lb (84.8 kg), SpO2 92 %. Physical Exam Vitals reviewed.  HENT:     Head: Normocephalic and atraumatic.     Nose: Nose normal.     Mouth/Throat:     Mouth: Mucous membranes are moist.  Eyes:     Extraocular Movements: Extraocular movements intact.  Cardiovascular:     Rate and Rhythm: Normal rate and regular rhythm.  Pulmonary:     Effort: Pulmonary effort is normal.     Breath sounds: Normal breath sounds.  Abdominal:     General: There is no distension.     Palpations: Abdomen is soft.     Tenderness: There is abdominal tenderness in the right lower quadrant.  Musculoskeletal:        General: Normal range of motion.     Cervical back: Normal range of motion.  Skin:    General: Skin is warm.  Neurological:     General: No focal deficit present.     Mental Status: She is alert and oriented to person, place, and time.  Psychiatric:        Mood and Affect: Mood normal.        Thought Content: Thought content normal.     Results: Personally reviewed CT 09/2022 and CTA 02/2022- celiac and SMA widely patent, no signs of obstruction or inflammation of the bowel that I see, no obvious hernias  CLINICAL DATA:  Abdominal pain, acute, nonlocalized   EXAM: CT ABDOMEN AND PELVIS WITH CONTRAST   TECHNIQUE: Multidetector CT imaging of the abdomen and pelvis was performed using the standard protocol following bolus administration of intravenous contrast.   RADIATION DOSE REDUCTION: This exam was performed  according to the departmental dose-optimization program which includes automated exposure control, adjustment of the mA and/or kV according to patient size and/or use of iterative reconstruction technique.   CONTRAST:  57mL OMNIPAQUE IOHEXOL 300 MG/ML  SOLN   COMPARISON:  CT renal 07/31/2015   FINDINGS: Lower chest: No acute abnormality.   Hepatobiliary: The hepatic parenchyma is diffusely hypodense compared to the splenic parenchyma consistent with fatty infiltration. No focal liver abnormality. Status post cholecystectomy. No biliary dilatation.   Pancreas: No focal lesion. Normal pancreatic contour. No surrounding inflammatory changes. No main pancreatic ductal dilatation.   Spleen: Normal in size without focal abnormality.   Adrenals/Urinary Tract:   No adrenal nodule bilaterally.   Bilateral kidneys enhance symmetrically. Subcentimeter hypodensities within the right kidney too small to characterize.   No hydronephrosis. No hydroureter.  No nephroureterolithiasis.   The urinary bladder is unremarkable.   On delayed imaging, there is no urothelial wall thickening and there are no filling defects in the opacified portions of the bilateral collecting systems or ureters.   Stomach/Bowel: PO contrast reaches the colon. Stomach is within normal limits. No evidence of bowel wall thickening or dilatation. Fatty infiltration of the colon wall suggestive of chronic inflammatory changes. Status post appendectomy.   Vascular/Lymphatic: No abdominal aorta or iliac aneurysm. No abdominal, pelvic, or inguinal lymphadenopathy.   Reproductive: Status post hysterectomy. No adnexal masses.   Other: No intraperitoneal free fluid. No intraperitoneal free gas. No organized fluid collection.   Musculoskeletal:   No abdominal wall hernia or abnormality.   No suspicious lytic or blastic osseous lesions. No acute displaced fracture. Multilevel degenerative changes of the spine.  IMPRESSION: 1. Hepatic steatosis. 2. Otherwise no acute intra-abdominal or intrapelvic abnormality patient status post cholecystectomy, hysterectomy, appendectomy.     Electronically Signed   By: Iven Finn M.D.   On: 10/03/2022 20:48  Assessment & Plan:  KANG LESKO is a 67 y.o. female with RLQ pain that is chronic. We are unsure of the etiology. Discussed that abdominal pain is difficult and can sometimes never be figured out. We discussed that sometimes people even need additional referral to specialized abdominal pain GI like Dr. Benita Stabile if we cannot figure out another cause. Discussed that these specialist do not accept insurance.   Will review last CT with radiology and also CTA of the chest.  I do not see any disease of the blood vessels that would make me think of any chronic mesenteric ischemia and no signs of any obvious pathology on the CT from 09/2022.  Once I review the CT and talk to the radiologist, we may repeat a different type of CT called a CT enterography to look at the bowel wall more specifically.   If radiology does not think a repeat CT with be helpful, I will call and discuss the option of diagnostic laparoscopy (exploratory surgery using the small incision) to look at the area and your intestines and make sure we are not missing anything.  We discussed risk of bleeding, infection, finding nothing, having to do an open surgery, needing bowel resection, and not helping the pain or making it worse.   I will update Dr. Randol Kern. I will call you with the next steps.    All questions were answered to the satisfaction of the patient and family.   Virl Cagey 03/17/2023, 2:13 PM

## 2023-03-17 NOTE — Patient Instructions (Addendum)
Will review you last CT with radiology and also your CTA of the chest.  I do not see any disease of the blood vessels that would make me think of any chronic mesenteric ischemia and no signs of any obvious pathology on the CT from 09/2022.  Once I review the CT and talk to the radiologist, we may repeat a different type of CT called a CT enterography to look at your bowel wall more specifically.   If radiology does not think a repeat CT with be helpful, I will call and discuss the option of diagnostic laparoscopy (exploratory surgery using the small incision) to look at the area and your intestines and make sure we are not missing anything.  I will update Dr. Randol Kern. I will call you with the next steps.

## 2023-03-18 ENCOUNTER — Telehealth: Payer: Self-pay | Admitting: *Deleted

## 2023-03-18 NOTE — Telephone Encounter (Signed)
----- Message from Virl Cagey, MD sent at 03/17/2023  4:34 PM EDT ----- Regarding: RE: ? Chronic mesenteric ischemia Yea lets hold on vascular or IR. With that CTA I do not think they would be too excited about any angiogram.   I can ask Dr. Donnetta Hutching vascular his opinion too.    Lets hold on any vascular/ IR referral (and I told patient to hold if anyone called about it). And I will review images with radiology and discuss with Dr. Donnetta Hutching.  Curlene Labrum, MD [facility] [address] [place], [place] [address] [phone] (office)    ----- Message ----- From: Loralee Pacas, MD Sent: 03/17/2023   3:46 PM EDT To: Virl Cagey, MD Subject: RE: ? Chronic mesenteric ischemia              Reviewing this makes me wonder if I should have my referral coordinator cancel discussions with IR and vascular.  Do you think we should maybe let one of those evaluate and render an opinion or was your impression pretty confident that there is not a vascular etiology. ----- Message ----- From: Virl Cagey, MD Sent: 03/17/2023   2:17 PM EDT To: Alyson Locket, RMA; Buell Parcel H Teoman Giraud, LPN; # Subject: FW: ? Chronic mesenteric ischemia              Saw patient today and unsure what she has going on but reviewed more of her imaging and talked to her about her issues.  The CT abd 09/2022 really nothing obvious and luckily the CTA from 2023 shows the celiac and the SMA and I do not think she has chronic mesenteric ischemia. I think any referral to vascular or IR is unneeded after finding that scan as those vessels are widely patent.   Her pain is random and daily. Not associated with anything specific.  I am going to review her imaging with radiology and make sure we are not missing a spigelian hernia or something and also make sure they do not think a CT enterography would help show anything about the bowel wall with that weird report of "Fatty infiltration" of  the colon.   Encouraging/ discouraging that her colonoscopy/ EGD were essentially negative.   I offered her a diagnostic laparoscopy if all else is negative or if radiology does not think CT enterography will offer any help.  She is going to think about it.  Thanks so much,  Curlene Labrum, MD [facility] [address] [place], [place] [address] [phone] (office)      ----- Message ----- From: Virl Cagey, MD Sent: 03/15/2023   2:36 PM EDT To: Alyson Locket, RMA; Lashaunta Sicard H Jacorian Golaszewski, LPN; # Subject: ? Chronic mesenteric ischemia                  To all,  I was reviewing my clinic for Thursday and see that I have been referred someone for chronic mesenteric ischemia? Imaging to date all normal (CT 10/23). It looks like Dr. Randol Kern had wanted and angiogram to get this diagnosis and possible treatment.  This would happen with Vascular surgery versus IR to diagnose and place a stent.  I am not generally involved in this pathology as I do not place stents, do angioplasty, or vascular bypasses, and usually general surgeons role in any mesenteric ischemia is when the unfortunate event of someone having dead bowel which requires resection.   I do not think  I need to see her Thursday. Can you make sure she gets referred to the appropriate location.  Curlene Labrum, MD [facility] [address] [place], [place] [address] [phone] (office)

## 2023-03-23 ENCOUNTER — Telehealth (INDEPENDENT_AMBULATORY_CARE_PROVIDER_SITE_OTHER): Payer: Medicare Other | Admitting: General Surgery

## 2023-03-23 DIAGNOSIS — G8929 Other chronic pain: Secondary | ICD-10-CM

## 2023-03-23 DIAGNOSIS — R1031 Right lower quadrant pain: Secondary | ICD-10-CM

## 2023-03-23 NOTE — Telephone Encounter (Signed)
[facility]  Called patient. Reviewed her imaging with radiology and looked at the area of pain in question and the bowel. Radiologist did not think a CT enterography would tell us much more as the small bowel appears normal and the fatty infiltrate of the colon could be related to a transient colitis or could just be her anatomy.  No obvious hernia noted.   Called and discussed option of diagnostic laparoscopy and looking at the intestines and verify no hernia. Discussed risk of bowel resection, risk of open procedure, risk of finding nothing, risk of bleeding, infection. Discussed that scar tissue could cause pain but not sure if we will find anything obvious based on the CT scan and that this could not improve her pain or could add to her pain due to the incisions.   She has had a full workup with her CT, colonoscopy/ egd. This could be related to IBS, musculoskeletal, but it is hard to say. I appreciate her frustration.  She is going to think about the options and let the office know if she decides she wants to pursue diagnostic laparoscopy.  I will cc her PCP Dr. Randol Kern so he is aware.  Curlene Labrum, MD [facility] [address] [place], [place] [address] [phone] (office)

## 2023-04-07 NOTE — Telephone Encounter (Signed)
Received call from patient [phone]~ telephone.   Reports that right sided pain continues, but she is going to defer surgery at this time.   Dr. Henreitta Leber to be made aware.

## 2023-04-11 NOTE — Telephone Encounter (Signed)
Should be able to get her in before then.

## 2023-04-11 NOTE — Telephone Encounter (Addendum)
Received call from patient [phone]~ telephone.   Reports that she is scheduled for hand surgery on 05/02/2023 and will have Moderate Sedation for anesthesia.   Inquired if she can have the diagnostic laparoscopy prior to the hand surgery.   Please advise.   Patient called back and reports that surgery is 05/05/2023.

## 2023-04-12 NOTE — Telephone Encounter (Signed)
CPT: [id number]- Diagnostic Laparoscopy  Dx: R10.31/ G89.29- Chronic Abdominal Pain, RLQ  Discussed with provider. Available dates given and patient choose 04/15/2023.

## 2023-04-13 NOTE — Patient Instructions (Signed)
Martha Orr  04/13/2023     @[email or web address] @   Your procedure is scheduled on  04/15/2023.   Report to [facility] at  0600  A.M.   Call this number if you have problems the morning of surgery:  [phone]  If you experience any cold or flu symptoms such as cough, fever, chills, shortness of breath, etc. between now and your scheduled surgery, please notify us at the above number.   Remember:  Do not eat or drink after midnight.      Take these medicines the morning of surgery with A SIP OF WATER           flexeril(if needed), lexapro, omeprazole, zofran (if needed).     Do not wear jewelry, make-up or nail polish.  Do not wear lotions, powders, or perfumes, or deodorant.  Do not shave 48 hours prior to surgery.  Men may shave face and neck.  Do not bring valuables to the hospital.  [facility] is not responsible for any belongings or valuables.  Contacts, dentures or bridgework may not be worn into surgery.  Leave your suitcase in the car.  After surgery it may be brought to your room.  For patients admitted to the hospital, discharge time will be determined by your treatment team.  Patients discharged the day of surgery will not be allowed to drive home and must have someone with them for 24 hours.     Special instructions:   DO NOT smoke tobacco or vape for 24 hours before your procedure.  Please read over the following fact sheets that you were given. Pain Booklet, Coughing and Deep Breathing, Surgical Site Infection Prevention, Anesthesia Post-op Instructions, and Care and Recovery After Surgery      Diagnostic Laparoscopy, Care After The following information offers guidance on how to care for yourself after your procedure. Your health care provider may also give you more specific instructions. If you have problems or questions, contact your health care provider. What can I expect after the procedure? After the procedure, it is common to  have: Mild discomfort in the abdomen. Sore throat. Women who have laparoscopy with a pelvic examination may have mild cramping and fluid coming from the vagina for a few days after the procedure. Follow these instructions at home: Medicines Take over-the-counter and prescription medicines only as told by your health care provider. If you were prescribed an antibiotic medicine, take it as told by your health care provider. Do not stop taking the antibiotic even if you start to feel better. Ask your health care provider if the medicine prescribed to you: Requires you to avoid driving or using machinery. Can cause constipation. You may need to take these actions to prevent or treat constipation: Drink enough fluid to keep your urine pale yellow. Take over-the-counter or prescription medicines. Eat foods that are high in fiber, such as beans, whole grains, and fresh fruits and vegetables. Limit foods that are high in fat and processed sugars, such as fried or sweet foods. Incision care  Follow instructions from your health care provider about how to take care of your incisions. Make sure you: Wash your hands with soap and water for at least 20 seconds before and after you change your bandage (dressing). If soap and water are not available, use hand sanitizer. Change your dressing as told by your health care provider. Leave stitches (sutures), skin glue, or surgical tape in place. These skin closures  may need to stay in place for 2 weeks or longer. If surgical tape edges start to loosen and curl up, you may trim the loose edges. Do not remove the surgical tape completely unless your health care provider tells you to do that. Check your incision areas every day for signs of infection. Check for: Redness, swelling, or pain. Fluid or blood. Warmth. Pus or a bad smell. Activity Return to your normal activities as told by your health care provider. Ask your health care provider what activities are  safe for you. Do not lift anything that is heavier than 10 lb (4.5 kg), or the limit that you are told, until your health care provider says that it is safe. Avoid sitting for a long time without moving. Get up to take short walks every 1-2 hours. This is important to improve blood flow and breathing. Ask for help if you feel weak or unsteady. General instructions Do not use any products that contain nicotine or tobacco. These products include cigarettes, chewing tobacco, and vaping devices, such as e-cigarettes. If you need help quitting, ask your health care provider. If you were given a sedative during the procedure, it can affect you for several hours. Do not drive or operate machinery until your health care provider says that it is safe. Do not take baths, swim, or use a hot tub until your health care provider approves. Ask your health care provider if you may take showers. You may only be allowed to take sponge baths. Keep all follow-up visits. This is important. Contact a health care provider if: You develop shoulder pain. You feel light-headed or faint. You are unable to pass gas or have a bowel movement. You feel nauseous or you vomit. You develop a rash. You have any of these signs of infection: Redness, swelling, or pain around an incision. Fluid or blood coming from an incision. Warmth coming from an incision. Pus or a bad smell coming from an incision. A fever or chills. Get help right away if: You have severe pain. You have vomiting that does not go away. You have heavy bleeding from the vagina. Any incision opens up. You have trouble breathing. You have chest pain. These symptoms may represent a serious problem that is an emergency. Do not wait to see if the symptoms will go away. Get medical help right away. Call your local emergency services (911 in the [place] Do not drive yourself to the hospital. Summary After the procedure, it is common to have mild discomfort in the  abdomen and a sore throat. Check your incision areas every day for signs of infection. Return to your normal activities as told by your health care provider. Ask your health care provider what activities are safe for you. This information is not intended to replace advice given to you by your health care provider. Make sure you discuss any questions you have with your health care provider. Document Revised: 08/08/2020 Document Reviewed: 08/08/2020 Else[employer] Patient Education  2023 [employer] General Anesthesia, Adult, Care After The following information offers guidance on how to care for yourself after your procedure. Your health care provider may also give you more specific instructions. If you have problems or questions, contact your health care provider. What can I expect after the procedure? After the procedure, it is common for people to: Have pain or discomfort at the IV site. Have nausea or vomiting. Have a sore throat or hoarseness. Have trouble concentrating. Feel cold or chills. Feel weak, sleepy,  or tired (fatigue). Have soreness and body aches. These can affect parts of the body that were not involved in surgery. Follow these instructions at home: For the time period you were told by your health care provider:  Rest. Do not participate in activities where you could fall or become injured. Do not drive or use machinery. Do not drink alcohol. Do not take sleeping pills or medicines that cause drowsiness. Do not make important decisions or sign legal documents. Do not take care of children on your own. General instructions Drink enough fluid to keep your urine pale yellow. If you have sleep apnea, surgery and certain medicines can increase your risk for breathing problems. Follow instructions from your health care provider about wearing your sleep device: Anytime you are sleeping, including during daytime naps. While taking prescription pain medicines, sleeping medicines,  or medicines that make you drowsy. Return to your normal activities as told by your health care provider. Ask your health care provider what activities are safe for you. Take over-the-counter and prescription medicines only as told by your health care provider. Do not use any products that contain nicotine or tobacco. These products include cigarettes, chewing tobacco, and vaping devices, such as e-cigarettes. These can delay incision healing after surgery. If you need help quitting, ask your health care provider. Contact a health care provider if: You have nausea or vomiting that does not get better with medicine. You vomit every time you eat or drink. You have pain that does not get better with medicine. You cannot urinate or have bloody urine. You develop a skin rash. You have a fever. Get help right away if: You have trouble breathing. You have chest pain. You vomit blood. These symptoms may be an emergency. Get help right away. Call 911. Do not wait to see if the symptoms will go away. Do not drive yourself to the hospital. Summary After the procedure, it is common to have a sore throat, hoarseness, nausea, vomiting, or to feel weak, sleepy, or fatigue. For the time period you were told by your health care provider, do not drive or use machinery. Get help right away if you have difficulty breathing, have chest pain, or vomit blood. These symptoms may be an emergency. This information is not intended to replace advice given to you by your health care provider. Make sure you discuss any questions you have with your health care provider. Document Revised: 03/12/2022 Document Reviewed: 03/12/2022 Else[employer] Patient Education  2023 [employer] How to Use Chlorhexidine Before Surgery Chlorhexidine gluconate (CHG) is a germ-killing (antiseptic) solution that is used to clean the skin. It can get rid of the bacteria that normally live on the skin and can keep them away for about 24 hours. To  clean your skin with CHG, you may be given: A CHG solution to use in the shower or as part of a sponge bath. A prepackaged cloth that contains CHG. Cleaning your skin with CHG may help lower the risk for infection: While you are staying in the intensive care unit of the hospital. If you have a vascular access, such as a central line, to provide short-term or long-term access to your veins. If you have a catheter to drain urine from your bladder. If you are on a ventilator. A ventilator is a machine that helps you breathe by moving air in and out of your lungs. After surgery. What are the risks? Risks of using CHG include: A skin reaction. Hearing loss, if CHG gets  in your ears and you have a perforated eardrum. Eye injury, if CHG gets in your eyes and is not rinsed out. The CHG product catching fire. Make sure that you avoid smoking and flames after applying CHG to your skin. Do not use CHG: If you have a chlorhexidine allergy or have previously reacted to chlorhexidine. On babies younger than 13 months of age. How to use CHG solution Use CHG only as told by your health care provider, and follow the instructions on the label. Use the full amount of CHG as directed. Usually, this is one bottle. During a shower Follow these steps when using CHG solution during a shower (unless your health care provider gives you different instructions): Start the shower. Use your normal soap and shampoo to wash your face and hair. Turn off the shower or move out of the shower stream. Pour the CHG onto a clean washcloth. Do not use any type of brush or rough-edged sponge. Starting at your neck, lather your body down to your toes. Make sure you follow these instructions: If you will be having surgery, pay special attention to the part of your body where you will be having surgery. Scrub this area for at least 1 minute. Do not use CHG on your head or face. If the solution gets into your ears or eyes, rinse  them well with water. Avoid your genital area. Avoid any areas of skin that have broken skin, cuts, or scrapes. Scrub your back and under your arms. Make sure to wash skin folds. Let the lather sit on your skin for 1-2 minutes or as long as told by your health care provider. Thoroughly rinse your entire body in the shower. Make sure that all body creases and crevices are rinsed well. Dry off with a clean towel. Do not put any substances on your body afterward--such as powder, lotion, or perfume--unless you are told to do so by your health care provider. Only use lotions that are recommended by the manufacturer. Put on clean clothes or pajamas. If it is the night before your surgery, sleep in clean sheets.  During a sponge bath Follow these steps when using CHG solution during a sponge bath (unless your health care provider gives you different instructions): Use your normal soap and shampoo to wash your face and hair. Pour the CHG onto a clean washcloth. Starting at your neck, lather your body down to your toes. Make sure you follow these instructions: If you will be having surgery, pay special attention to the part of your body where you will be having surgery. Scrub this area for at least 1 minute. Do not use CHG on your head or face. If the solution gets into your ears or eyes, rinse them well with water. Avoid your genital area. Avoid any areas of skin that have broken skin, cuts, or scrapes. Scrub your back and under your arms. Make sure to wash skin folds. Let the lather sit on your skin for 1-2 minutes or as long as told by your health care provider. Using a different clean, wet washcloth, thoroughly rinse your entire body. Make sure that all body creases and crevices are rinsed well. Dry off with a clean towel. Do not put any substances on your body afterward--such as powder, lotion, or perfume--unless you are told to do so by your health care provider. Only use lotions that are  recommended by the manufacturer. Put on clean clothes or pajamas. If it is the night before your surgery,  sleep in clean sheets. How to use CHG prepackaged cloths Only use CHG cloths as told by your health care provider, and follow the instructions on the label. Use the CHG cloth on clean, dry skin. Do not use the CHG cloth on your head or face unless your health care provider tells you to. When washing with the CHG cloth: Avoid your genital area. Avoid any areas of skin that have broken skin, cuts, or scrapes. Before surgery Follow these steps when using a CHG cloth to clean before surgery (unless your health care provider gives you different instructions): Using the CHG cloth, vigorously scrub the part of your body where you will be having surgery. Scrub using a back-and-forth motion for 3 minutes. The area on your body should be completely wet with CHG when you are done scrubbing. Do not rinse. Discard the cloth and let the area air-dry. Do not put any substances on the area afterward, such as powder, lotion, or perfume. Put on clean clothes or pajamas. If it is the night before your surgery, sleep in clean sheets.  For general bathing Follow these steps when using CHG cloths for general bathing (unless your health care provider gives you different instructions). Use a separate CHG cloth for each area of your body. Make sure you wash between any folds of skin and between your fingers and toes. Wash your body in the following order, switching to a new cloth after each step: The front of your neck, shoulders, and chest. Both of your arms, under your arms, and your hands. Your stomach and groin area, avoiding the genitals. Your right leg and foot. Your left leg and foot. The back of your neck, your back, and your buttocks. Do not rinse. Discard the cloth and let the area air-dry. Do not put any substances on your body afterward--such as powder, lotion, or perfume--unless you are told to do  so by your health care provider. Only use lotions that are recommended by the manufacturer. Put on clean clothes or pajamas. Contact a health care provider if: Your skin gets irritated after scrubbing. You have questions about using your solution or cloth. You swallow any chlorhexidine. Call your local poison control center ([phone] in the [place] Get help right away if: Your eyes itch badly, or they become very red or swollen. Your skin itches badly and is red or swollen. Your hearing changes. You have trouble seeing. You have swelling or tingling in your mouth or throat. You have trouble breathing. These symptoms may represent a serious problem that is an emergency. Do not wait to see if the symptoms will go away. Get medical help right away. Call your local emergency services (911 in the [place] Do not drive yourself to the hospital. Summary Chlorhexidine gluconate (CHG) is a germ-killing (antiseptic) solution that is used to clean the skin. Cleaning your skin with CHG may help to lower your risk for infection. You may be given CHG to use for bathing. It may be in a bottle or in a prepackaged cloth to use on your skin. Carefully follow your health care provider's instructions and the instructions on the product label. Do not use CHG if you have a chlorhexidine allergy. Contact your health care provider if your skin gets irritated after scrubbing. This information is not intended to replace advice given to you by your health care provider. Make sure you discuss any questions you have with your health care provider. Document Revised: 04/12/2022 Document Reviewed: 02/23/2021 Elsevier Patient Education  2023 [employer]  

## 2023-04-13 NOTE — H&P (Signed)
[facility] History and Physical  Reason for Referral: Chronic lower abdominal pain Referring Physician: Morrison, Ryan G, MD   Chief Complaint   New Patient (Initial Visit)     Martha Orr is a 66 y.o. female.  HPI: Martha Orr is a 66 yo who reports having pain in the right lower abdomen for over the past 6 months. Martha Orr denies any associated nausea, vomiting or issues with constipation or diarrhea. Martha Orr has regular Bms. Martha Orr says that the pain is random and is very severe at times. The pain goes from dull to sharp and will occur with sleeping, moving, or eating. Martha Orr has been seen by GI and had a colonoscopy with polyp removal but no visualization of the ileum, and EGD with hiatal hernia.   The patient has had a prior history of appendectomy that was open and cholecystectomy that was laparoscopic all over 20 years ago. Martha Orr had a hysterectomy in the time since.  Martha Orr is here today with her husband. They are very frustrated and upset about why this is occurring. Martha Orr has been seen by her PCP Dr. Morrison who has tried to think of any possibility.  Martha Orr had a CT a/p in 09/2022 without any obvious signs of obstruction or hernia. Martha Orr did have a odd report of "fatty infiltration of the colon" which radiologist reported could be associated inflammation but no inflammation or changes on her colonoscopy reported.   Dr. Morrison had question the potential of chronic mesenteric ischemic but Martha Orr reports no food fear to me, no weight loss and a CTA chest from 01/2022 shows widely patent celiac and SMA so I think this is unlikely.   GI reports that this could be musculoskeletal or IBS type symptoms. Martha Orr was referred to me to see if there is any cause for pain from any adhesions or her prior surgeries.   Past Medical History:  Diagnosis Date   Anxiety    Arthritis    Chronic back pain    buldging disc   Depression    takes Lexapro daily   Family history of adverse reaction to  anesthesia    sister gets sick after anesthesia   Gallstones    Hepatic steatosis 10/04/2022   High cholesterol    takes Crestor daily   History of colon polyps    benign   History of shingles    Hypertension    Hypothyroidism    takes Synthroid daily   Joint pain    Joint swelling    Microscopic hematuria    states her entire life and family is the same way   Nausea 09/27/2022    Past Surgical History:  Procedure Laterality Date   ABDOMINAL HYSTERECTOMY  1998   APPENDECTOMY     BREAST BIOPSY     BUNIONECTOMY Bilateral    CARPAL TUNNEL RELEASE Bilateral    CHOLECYSTECTOMY     COLONOSCOPY     KNEE ARTHROSCOPY Right    KNEE ARTHROSCOPY Left 11/01/2017   Procedure: ARTHROSCOPY LEFT KNEE;  Surgeon: Dalldorf, Peter, MD;  Location: [facility] OR;  Service: Orthopedics;  Laterality: Left;   knot removed from right breast     NASAL SINUS SURGERY     TONSILLECTOMY     TONSILLECTOMY AND ADENOIDECTOMY  2002   TOTAL KNEE ARTHROPLASTY Bilateral 10/12/2016   Procedure: TOTAL KNEE BILATERAL;  Surgeon: Peter Dalldorf, MD;  Location: [facility] OR;  Service: Orthopedics;  Laterality: Bilateral;    Family History  Problem Relation Age of   Onset   Heart attack Mother    Hyperlipidemia Mother    Arthritis Father    Arthritis Sister    Arthritis Sister    Heart attack Sister    Arthritis Sister    Hyperlipidemia Sister    Stroke Sister    Cancer Brother    Depression Brother    Heart attack Brother    Heart disease Brother    Hypertension Brother    Hyperlipidemia Brother    Cancer Brother    Heart attack Brother    Colon cancer Neg Hx    Esophageal cancer Neg Hx    Rectal cancer Neg Hx    Stomach cancer Neg Hx     Social History   Tobacco Use   Smoking status: Never   Smokeless tobacco: Never  Vaping Use   Vaping Use: Never used  Substance Use Topics   Alcohol use: Yes    Comment: occasionally   Drug use: No    Medications: I have reviewed the patient's current  medications. Allergies as of 03/17/2023       Reactions   No Known Allergies         Medication List        Accurate as of March 17, 2023  2:13 PM. If you have any questions, ask your nurse or doctor.          STOP taking these medications    aspirin EC 81 MG tablet Stopped by: Zechariah Bissonnette C Resa Rinks, MD   colestipol 1 g tablet Commonly known as: Colestid Stopped by: Tami Blass C Ronnae Kaser, MD   HYDROcodone-acetaminophen 5-325 MG tablet Commonly known as: NORCO/VICODIN Stopped by: Pearson Picou C Jalesa Thien, MD   Na Sulfate-K Sulfate-Mg Sulf 17.5-3.13-1.6 GM/177ML Soln Commonly known as: Suprep Bowel Prep Kit Stopped by: Keimon Basaldua C Gloris Shiroma, MD       TAKE these medications    acyclovir 400 MG tablet Commonly known as: ZOVIRAX As needed   cholecalciferol 1000 units tablet Commonly known as: VITAMIN D Take 1,000 Units by mouth at bedtime.   dicyclomine 10 MG capsule Commonly known as: BENTYL TAKE ONE CAPSULE BY MOUTH FOUR TIMES DAILY BEFORE MEALS & AT BEDTIME   escitalopram 20 MG tablet Commonly known as: LEXAPRO TAKE 1 TABLET DAILY   fluticasone 50 MCG/ACT nasal spray Commonly known as: FLONASE Place 2 sprays into both nostrils daily. PRN   Ginkgo Biloba Extract 60 MG Caps Take 1 capsule by mouth daily.   HAIR/SKIN/NAILS PO Take by mouth.   hydrochlorothiazide 12.5 MG capsule Commonly known as: MICROZIDE TAKE 1 CAPSULE DAILY   Omega 3 1000 MG Caps Take 1 capsule by mouth daily.   omeprazole 40 MG capsule Commonly known as: PRILOSEC TAKE 1 CAPSULE DAILY AS    NEEDED   ondansetron 4 MG disintegrating tablet Commonly known as: ZOFRAN-ODT Take 1 tablet (4 mg total) by mouth every 8 (eight) hours as needed for nausea or vomiting (for nausea from wegovy or other source).   rosuvastatin 40 MG tablet Commonly known as: CRESTOR TAKE 0.5 TABLETS (20 MG TOTAL) BY MOUTH AT BEDTIME. REPLACES 20 MG DOSE   Synthroid 112 MCG tablet Generic drug: levothyroxine TAKE 1  TABLET AT BEDTIME   Vitamin D (Ergocalciferol) 1.25 MG (50000 UNIT) Caps capsule Commonly known as: DRISDOL Take 1 capsule (50,000 Units total) by mouth every 7 (seven) days.         ROS:  A comprehensive review of systems was negative except for: Gastrointestinal: positive for abdominal pain   Musculoskeletal: positive for back pain  Blood pressure 121/85, pulse 79, temperature 98.4 F (36.9 C), temperature source Oral, resp. rate 16, height 5' 6" (1.676 m), weight 187 lb (84.8 kg), SpO2 92 %. Physical Exam Vitals reviewed.  HENT:     Head: Normocephalic and atraumatic.     Nose: Nose normal.     Mouth/Throat:     Mouth: Mucous membranes are moist.  Eyes:     Extraocular Movements: Extraocular movements intact.  Cardiovascular:     Rate and Rhythm: Normal rate and regular rhythm.  Pulmonary:     Effort: Pulmonary effort is normal.     Breath sounds: Normal breath sounds.  Abdominal:     General: There is no distension.     Palpations: Abdomen is soft.     Tenderness: There is abdominal tenderness in the right lower quadrant.  Musculoskeletal:        General: Normal range of motion.     Cervical back: Normal range of motion.  Skin:    General: Skin is warm.  Neurological:     General: No focal deficit present.     Mental Status: Martha Orr is alert and oriented to person, place, and time.  Psychiatric:        Mood and Affect: Mood normal.        Thought Content: Thought content normal.     Results: Personally reviewed CT 09/2022 and CTA 02/2022- celiac and SMA widely patent, no signs of obstruction or inflammation of the bowel that I see, no obvious hernias  CLINICAL DATA:  Abdominal pain, acute, nonlocalized   EXAM: CT ABDOMEN AND PELVIS WITH CONTRAST   TECHNIQUE: Multidetector CT imaging of the abdomen and pelvis was performed using the standard protocol following bolus administration of intravenous contrast.   RADIATION DOSE REDUCTION: This exam was performed  according to the departmental dose-optimization program which includes automated exposure control, adjustment of the mA and/or kV according to patient size and/or use of iterative reconstruction technique.   CONTRAST:  80mL OMNIPAQUE IOHEXOL 300 MG/ML  SOLN   COMPARISON:  CT renal 07/31/2015   FINDINGS: Lower chest: No acute abnormality.   Hepatobiliary: The hepatic parenchyma is diffusely hypodense compared to the splenic parenchyma consistent with fatty infiltration. No focal liver abnormality. Status post cholecystectomy. No biliary dilatation.   Pancreas: No focal lesion. Normal pancreatic contour. No surrounding inflammatory changes. No main pancreatic ductal dilatation.   Spleen: Normal in size without focal abnormality.   Adrenals/Urinary Tract:   No adrenal nodule bilaterally.   Bilateral kidneys enhance symmetrically. Subcentimeter hypodensities within the right kidney too small to characterize.   No hydronephrosis. No hydroureter.  No nephroureterolithiasis.   The urinary bladder is unremarkable.   On delayed imaging, there is no urothelial wall thickening and there are no filling defects in the opacified portions of the bilateral collecting systems or ureters.   Stomach/Bowel: PO contrast reaches the colon. Stomach is within normal limits. No evidence of bowel wall thickening or dilatation. Fatty infiltration of the colon wall suggestive of chronic inflammatory changes. Status post appendectomy.   Vascular/Lymphatic: No abdominal aorta or iliac aneurysm. No abdominal, pelvic, or inguinal lymphadenopathy.   Reproductive: Status post hysterectomy. No adnexal masses.   Other: No intraperitoneal free fluid. No intraperitoneal free gas. No organized fluid collection.   Musculoskeletal:   No abdominal wall hernia or abnormality.   No suspicious lytic or blastic osseous lesions. No acute displaced fracture. Multilevel degenerative changes of the spine.      IMPRESSION: 1. Hepatic steatosis. 2. Otherwise no acute intra-abdominal or intrapelvic abnormality patient status post cholecystectomy, hysterectomy, appendectomy.     Electronically Signed   By: Morgane  Naveau M.D.   On: 10/03/2022 20:48  Assessment & Plan:  Lakyla P Rowles is a 66 y.o. female with RLQ pain that is chronic. We are unsure of the etiology. Discussed that abdominal pain is difficult and can sometimes never be figured out. We discussed that sometimes people even need additional referral to specialized abdominal pain GI like Dr. Douglas Drossman if we cannot figure out another cause. Discussed that these specialist do not accept insurance.   Will review last CT with radiology and also CTA of the chest.  I do not see any disease of the blood vessels that would make me think of any chronic mesenteric ischemia and no signs of any obvious pathology on the CT from 09/2022.  Once I review the CT and talk to the radiologist, we may repeat a different type of CT called a CT enterography to look at the bowel wall more specifically.   If radiology does not think a repeat CT with be helpful, I will call and discuss the option of diagnostic laparoscopy (exploratory surgery using the small incision) to look at the area and your intestines and make sure we are not missing anything.  We discussed risk of bleeding, infection, finding nothing, having to do an open surgery, needing bowel resection, and not helping the pain or making it worse.   I will update Dr. Morrison. I will call you with the next steps.    All questions were answered to the satisfaction of the patient and family.   Herbert Aguinaldo C Jayke Caul 03/17/2023, 2:13 PM       

## 2023-04-14 ENCOUNTER — Encounter (HOSPITAL_COMMUNITY)
Admission: RE | Admit: 2023-04-14 | Discharge: 2023-04-14 | Disposition: A | Discharge status: Home / Self Care | Payer: Medicare Other | Source: Ambulatory Visit | Attending: General Surgery | Admitting: General Surgery

## 2023-04-14 ENCOUNTER — Encounter (HOSPITAL_COMMUNITY): Payer: Self-pay

## 2023-04-14 ENCOUNTER — Other Ambulatory Visit: Payer: Self-pay

## 2023-04-14 VITALS — BP 120/84 | HR 98 | Temp 97.8°F | Resp 18 | Ht 66.0 in | Wt 186.9 lb

## 2023-04-14 DIAGNOSIS — R9431 Abnormal electrocardiogram [ECG] [EKG]: Secondary | ICD-10-CM | POA: Diagnosis not present

## 2023-04-14 DIAGNOSIS — I10 Essential (primary) hypertension: Secondary | ICD-10-CM | POA: Diagnosis not present

## 2023-04-14 DIAGNOSIS — Z79899 Other long term (current) drug therapy: Secondary | ICD-10-CM | POA: Diagnosis not present

## 2023-04-14 DIAGNOSIS — Z01818 Encounter for other preprocedural examination: Secondary | ICD-10-CM | POA: Diagnosis present

## 2023-04-14 LAB — BASIC METABOLIC PANEL
Anion gap: 9 (ref 5–15)
BUN: 14 mg/dL (ref 8–23)
CO2: 26 mmol/L (ref 22–32)
Calcium: 9.1 mg/dL (ref 8.9–10.3)
Chloride: 100 mmol/L (ref 98–111)
Creatinine, Ser: 0.82 mg/dL (ref 0.44–1.00)
GFR, Estimated: >60 mL/min (ref >60)
Glucose, Bld: 177 mg/dL — ABNORMAL HIGH (ref 70–99)
Potassium: 3.1 mmol/L — ABNORMAL LOW (ref 3.5–5.1)
Sodium: 135 mmol/L (ref 135–145)

## 2023-04-15 ENCOUNTER — Other Ambulatory Visit: Payer: Self-pay

## 2023-04-15 ENCOUNTER — Ambulatory Visit (HOSPITAL_COMMUNITY): Payer: Medicare Other | Admitting: Certified Registered Nurse Anesthetist

## 2023-04-15 ENCOUNTER — Encounter (HOSPITAL_COMMUNITY)
Admission: RE | Disposition: A | Discharge status: Home / Self Care | Payer: Self-pay | Source: Home / Self Care | Attending: General Surgery

## 2023-04-15 ENCOUNTER — Encounter (HOSPITAL_COMMUNITY): Payer: Self-pay | Admitting: General Surgery

## 2023-04-15 ENCOUNTER — Ambulatory Visit (HOSPITAL_COMMUNITY)
Admission: RE | Admit: 2023-04-15 | Discharge: 2023-04-15 | Disposition: A | Discharge status: Home / Self Care | Payer: Medicare Other | Attending: General Surgery | Admitting: General Surgery

## 2023-04-15 ENCOUNTER — Ambulatory Visit (HOSPITAL_BASED_OUTPATIENT_CLINIC_OR_DEPARTMENT_OTHER): Payer: Medicare Other | Admitting: Certified Registered Nurse Anesthetist

## 2023-04-15 DIAGNOSIS — I10 Essential (primary) hypertension: Secondary | ICD-10-CM

## 2023-04-15 DIAGNOSIS — R110 Nausea: Secondary | ICD-10-CM

## 2023-04-15 DIAGNOSIS — R1031 Right lower quadrant pain: Secondary | ICD-10-CM

## 2023-04-15 DIAGNOSIS — G8929 Other chronic pain: Secondary | ICD-10-CM | POA: Insufficient documentation

## 2023-04-15 DIAGNOSIS — F418 Other specified anxiety disorders: Secondary | ICD-10-CM | POA: Diagnosis not present

## 2023-04-15 DIAGNOSIS — K219 Gastro-esophageal reflux disease without esophagitis: Secondary | ICD-10-CM | POA: Insufficient documentation

## 2023-04-15 DIAGNOSIS — K449 Diaphragmatic hernia without obstruction or gangrene: Secondary | ICD-10-CM | POA: Insufficient documentation

## 2023-04-15 DIAGNOSIS — Z90710 Acquired absence of both cervix and uterus: Secondary | ICD-10-CM | POA: Insufficient documentation

## 2023-04-15 DIAGNOSIS — E039 Hypothyroidism, unspecified: Secondary | ICD-10-CM

## 2023-04-15 DIAGNOSIS — Z9049 Acquired absence of other specified parts of digestive tract: Secondary | ICD-10-CM | POA: Diagnosis not present

## 2023-04-15 HISTORY — PX: LAPAROSCOPY: SHX197

## 2023-04-15 SURGERY — LAPAROSCOPY, DIAGNOSTIC
Anesthesia: General | Site: Abdomen

## 2023-04-15 MED ORDER — PHENYLEPHRINE HCL (PRESSORS) 10 MG/ML IV SOLN
INTRAVENOUS | Status: AC
  Filled 2023-04-15: qty 1

## 2023-04-15 MED ORDER — PROPOFOL 10 MG/ML IV BOLUS
INTRAVENOUS | Status: AC
  Filled 2023-04-15: qty 20

## 2023-04-15 MED ORDER — FENTANYL CITRATE (PF) 250 MCG/5ML IJ SOLN
INTRAMUSCULAR | Status: DC | PRN
  Administered 2023-04-15: 100 ug via INTRAVENOUS
  Administered 2023-04-15 (×2): 50 ug via INTRAVENOUS

## 2023-04-15 MED ORDER — METOCLOPRAMIDE HCL 5 MG/ML IJ SOLN
INTRAMUSCULAR | Status: AC
  Filled 2023-04-15: qty 2

## 2023-04-15 MED ORDER — PHENYLEPHRINE 80 MCG/ML (10ML) SYRINGE FOR IV PUSH (FOR BLOOD PRESSURE SUPPORT)
PREFILLED_SYRINGE | INTRAVENOUS | Status: AC
  Filled 2023-04-15: qty 30

## 2023-04-15 MED ORDER — ROCURONIUM BROMIDE 10 MG/ML (PF) SYRINGE
PREFILLED_SYRINGE | INTRAVENOUS | Status: AC
  Filled 2023-04-15: qty 20

## 2023-04-15 MED ORDER — ROCURONIUM BROMIDE 10 MG/ML (PF) SYRINGE
PREFILLED_SYRINGE | INTRAVENOUS | Status: DC | PRN
  Administered 2023-04-15: 10 mg via INTRAVENOUS
  Administered 2023-04-15: 30 mg via INTRAVENOUS

## 2023-04-15 MED ORDER — OXYCODONE HCL 5 MG PO TABS: 5.0000 mg | ORAL_TABLET | ORAL | 0 refills | Status: DC | PRN

## 2023-04-15 MED ORDER — BUPIVACAINE HCL (PF) 0.5 % IJ SOLN
INTRAMUSCULAR | Status: AC
  Filled 2023-04-15: qty 30

## 2023-04-15 MED ORDER — DEXAMETHASONE SODIUM PHOSPHATE 10 MG/ML IJ SOLN
INTRAMUSCULAR | Status: AC
  Filled 2023-04-15: qty 2

## 2023-04-15 MED ORDER — PROPOFOL 10 MG/ML IV BOLUS
INTRAVENOUS | Status: DC | PRN
  Administered 2023-04-15: 150 mg via INTRAVENOUS

## 2023-04-15 MED ORDER — SUCCINYLCHOLINE CHLORIDE 200 MG/10ML IV SOSY
PREFILLED_SYRINGE | INTRAVENOUS | Status: DC | PRN
  Administered 2023-04-15: 120 mg via INTRAVENOUS

## 2023-04-15 MED ORDER — LACTATED RINGERS IV SOLN: INTRAVENOUS | Status: DC

## 2023-04-15 MED ORDER — DEXAMETHASONE SODIUM PHOSPHATE 10 MG/ML IJ SOLN
INTRAMUSCULAR | Status: DC | PRN
  Administered 2023-04-15: 10 mg via INTRAVENOUS

## 2023-04-15 MED ORDER — MIDAZOLAM HCL 2 MG/2ML IJ SOLN
INTRAMUSCULAR | Status: DC | PRN
  Administered 2023-04-15: 1 mg via INTRAVENOUS

## 2023-04-15 MED ORDER — METOCLOPRAMIDE HCL 5 MG/ML IJ SOLN
10.0000 mg | Freq: Once | INTRAMUSCULAR | Status: AC
  Administered 2023-04-15: 10 mg via INTRAVENOUS

## 2023-04-15 MED ORDER — CHLORHEXIDINE GLUCONATE 0.12 % MT SOLN
15.0000 mL | Freq: Once | OROMUCOSAL | Status: AC
  Administered 2023-04-15: 15 mL via OROMUCOSAL
  Filled 2023-04-15: qty 15

## 2023-04-15 MED ORDER — ONDANSETRON HCL 4 MG/2ML IJ SOLN
INTRAMUSCULAR | Status: DC | PRN
  Administered 2023-04-15: 4 mg via INTRAVENOUS

## 2023-04-15 MED ORDER — 0.9 % SODIUM CHLORIDE (POUR BTL) OPTIME
TOPICAL | Status: DC | PRN
  Administered 2023-04-15: 1000 mL

## 2023-04-15 MED ORDER — MIDAZOLAM HCL 2 MG/2ML IJ SOLN
2.0000 mg | Freq: Once | INTRAMUSCULAR | Status: AC
  Administered 2023-04-15: 2 mg via INTRAVENOUS

## 2023-04-15 MED ORDER — ORAL CARE MOUTH RINSE: 15.0000 mL | Freq: Once | OROMUCOSAL | Status: AC

## 2023-04-15 MED ORDER — SUGAMMADEX SODIUM 200 MG/2ML IV SOLN
INTRAVENOUS | Status: DC | PRN
  Administered 2023-04-15: 250 mg via INTRAVENOUS

## 2023-04-15 MED ORDER — MIDAZOLAM HCL 2 MG/2ML IJ SOLN
INTRAMUSCULAR | Status: AC
  Filled 2023-04-15: qty 2

## 2023-04-15 MED ORDER — ONDANSETRON HCL 4 MG/2ML IJ SOLN
INTRAMUSCULAR | Status: AC
  Filled 2023-04-15: qty 4

## 2023-04-15 MED ORDER — ONDANSETRON 4 MG PO TBDP
4.0000 mg | ORAL_TABLET | Freq: Three times a day (TID) | ORAL | 0 refills | Status: DC | PRN
Start: 2023-04-15 — End: 2023-04-28

## 2023-04-15 MED ORDER — FENTANYL CITRATE (PF) 250 MCG/5ML IJ SOLN
INTRAMUSCULAR | Status: AC
  Filled 2023-04-15: qty 5

## 2023-04-15 MED ORDER — CEFAZOLIN SODIUM-DEXTROSE 2-4 GM/100ML-% IV SOLN
2.0000 g | INTRAVENOUS | Status: AC
  Administered 2023-04-15: 2 g via INTRAVENOUS
  Filled 2023-04-15: qty 100

## 2023-04-15 MED ORDER — LIDOCAINE 2% (20 MG/ML) 5 ML SYRINGE
INTRAMUSCULAR | Status: DC | PRN
  Administered 2023-04-15: 60 mg via INTRAVENOUS

## 2023-04-15 MED ORDER — LIDOCAINE HCL (PF) 2 % IJ SOLN
INTRAMUSCULAR | Status: AC
  Filled 2023-04-15: qty 10

## 2023-04-15 MED ORDER — HYDROMORPHONE HCL 1 MG/ML IJ SOLN
0.2500 mg | INTRAMUSCULAR | Status: DC | PRN
  Administered 2023-04-15: 0.5 mg via INTRAVENOUS
  Filled 2023-04-15 (×2): qty 0.5

## 2023-04-15 MED ORDER — MEPERIDINE HCL 50 MG/ML IJ SOLN: 6.2500 mg | INTRAMUSCULAR | Status: DC | PRN

## 2023-04-15 MED ORDER — CHLORHEXIDINE GLUCONATE CLOTH 2 % EX PADS: 6.0000 | MEDICATED_PAD | Freq: Once | CUTANEOUS | Status: DC

## 2023-04-15 MED ORDER — BUPIVACAINE HCL (PF) 0.5 % IJ SOLN
INTRAMUSCULAR | Status: DC | PRN
  Administered 2023-04-15: 10 mL
  Administered 2023-04-15: 20 mL

## 2023-04-15 MED ORDER — ONDANSETRON HCL 4 MG/2ML IJ SOLN: 4.0000 mg | Freq: Once | INTRAMUSCULAR | Status: DC | PRN

## 2023-04-15 MED ORDER — SUCCINYLCHOLINE CHLORIDE 200 MG/10ML IV SOSY
PREFILLED_SYRINGE | INTRAVENOUS | Status: AC
  Filled 2023-04-15: qty 20

## 2023-04-15 MED ORDER — EPHEDRINE 5 MG/ML INJ
INTRAVENOUS | Status: AC
  Filled 2023-04-15: qty 10

## 2023-04-15 SURGICAL SUPPLY — 40 items
ADH SKN CLS APL DERMABOND .7 (GAUZE/BANDAGES/DRESSINGS) ×1
APL PRP STRL LF DISP 70% ISPRP (MISCELLANEOUS) ×1
BLADE SURG 15 STRL LF DISP TIS (BLADE) ×1 IMPLANT
BLADE SURG 15 STRL SS (BLADE) ×1
CHLORAPREP W/TINT 26 (MISCELLANEOUS) ×1 IMPLANT
CLOTH BEACON ORANGE TIMEOUT ST (SAFETY) ×1 IMPLANT
COVER LIGHT HANDLE STERIS (MISCELLANEOUS) ×2 IMPLANT
DECANTER SPIKE VIAL GLASS SM (MISCELLANEOUS) ×1 IMPLANT
DERMABOND ADVANCED .7 DNX12 (GAUZE/BANDAGES/DRESSINGS) IMPLANT
ELECT REM PT RETURN 9FT ADLT (ELECTROSURGICAL) ×1
ELECTRODE REM PT RTRN 9FT ADLT (ELECTROSURGICAL) ×1 IMPLANT
GLOVE BIO SURGEON STRL SZ 6.5 (GLOVE) ×1 IMPLANT
GLOVE BIO SURGEON STRL SZ7 (GLOVE) IMPLANT
GLOVE BIOGEL PI IND STRL 6.5 (GLOVE) ×1 IMPLANT
GLOVE BIOGEL PI IND STRL 7.0 (GLOVE) ×2 IMPLANT
GOWN STRL REUS W/TWL LRG LVL3 (GOWN DISPOSABLE) ×2 IMPLANT
INST SET LAPROSCOPIC AP (KITS) ×1 IMPLANT
KIT TURNOVER KIT A (KITS) ×1 IMPLANT
MANIFOLD NEPTUNE II (INSTRUMENTS) ×1 IMPLANT
NDL HYPO 21X1.5 SAFETY (NEEDLE) IMPLANT
NDL HYPO 25GX1X1/2 BEV (NEEDLE) IMPLANT
NDL INSUFFLATION 14GA 120MM (NEEDLE) ×1 IMPLANT
NEEDLE HYPO 21X1.5 SAFETY (NEEDLE) ×1 IMPLANT
NEEDLE HYPO 25GX1X1/2 BEV (NEEDLE) ×1 IMPLANT
NEEDLE INSUFFLATION 14GA 120MM (NEEDLE) ×1 IMPLANT
NS IRRIG 1000ML POUR BTL (IV SOLUTION) ×1 IMPLANT
PACK LAP CHOLE LZT030E (CUSTOM PROCEDURE TRAY) ×1 IMPLANT
PAD ARMBOARD 7.5X6 YLW CONV (MISCELLANEOUS) ×1 IMPLANT
SET BASIN LINEN APH (SET/KITS/TRAYS/PACK) ×1 IMPLANT
SLEEVE Z-THREAD 5X100MM (TROCAR) ×1 IMPLANT
SUT MNCRL AB 4-0 PS2 18 (SUTURE) IMPLANT
SUT VICRYL 0 UR6 27IN ABS (SUTURE) IMPLANT
SYR 30ML LL (SYRINGE) IMPLANT
TRAY FOLEY W/BAG SLVR 16FR (SET/KITS/TRAYS/PACK) ×1
TRAY FOLEY W/BAG SLVR 16FR ST (SET/KITS/TRAYS/PACK) ×1 IMPLANT
TROCAR Z-THRD FIOS HNDL 11X100 (TROCAR) ×1 IMPLANT
TROCAR Z-THREAD FIOS 5X100MM (TROCAR) ×1 IMPLANT
TROCAR Z-THREAD SLEEVE 11X100 (TROCAR) IMPLANT
TUBING EVAC SMOKE HEATED PNEUM (TUBING) ×1 IMPLANT
WARMER LAPAROSCOPE (MISCELLANEOUS) ×1 IMPLANT

## 2023-04-15 NOTE — Discharge Instructions (Signed)
Discharge Laparoscopic Surgery Instructions:  Common Complaints: Right shoulder pain is common after laparoscopic surgery. This is secondary to the gas used in the surgery being trapped under the diaphragm.  Walk to help your body absorb the gas. This will improve in a few days. Pain at the port sites are common, especially the larger port sites. This will improve with time.  Some nausea is common and poor appetite. The main goal is to stay hydrated the first few days after surgery.   Diet/ Activity: Diet as tolerated. You may not have an appetite, but it is important to stay hydrated. Drink 64 ounces of water a day. Your appetite will return with time.  Shower per your regular routine daily.  Do not take hot showers. Take warm showers that are less than 10 minutes. Rest and listen to your body, but do not remain in bed all day.  Walk everyday for at least 15-20 minutes. Deep cough and move around every 1-2 hours in the first few days after surgery.  Do not lift > 10 lbs, perform excessive bending, pushing, pulling, squatting for 1-2 weeks after surgery.  Do not pick at the dermabond glue on your incision sites.  This glue film will remain in place for 1-2 weeks and will start to peel off.  Do not place lotions or balms on your incision unless instructed to specifically by Dr. Robet Crutchfield.   Pain Expectations and Narcotics: -After surgery you will have pain associated with your incisions and this is normal. The pain is muscular and nerve pain, and will get better with time. -You are encouraged and expected to take non narcotic medications like tylenol and ibuprofen (when able) to treat pain as multiple modalities can aid with pain treatment. -Narcotics are only used when pain is severe or there is breakthrough pain. -You are not expected to have a pain score of 0 after surgery, as we cannot prevent pain. A pain score of 3-4 that allows you to be functional, move, walk, and tolerate some activity is  the goal. The pain will continue to improve over the days after surgery and is dependent on your surgery. -Due to [place] law, we are only able to give a certain amount of pain medication to treat post operative pain, and we only give additional narcotics on a patient by patient basis.  -For most laparoscopic surgery, studies have shown that the majority of patients only need 10-15 narcotic pills, and for open surgeries most patients only need 15-20.   -Having appropriate expectations of pain and knowledge of pain management with non narcotics is important as we do not want anyone to become addicted to narcotic pain medication.  -Using ice packs in the first 48 hours and heating pads after 48 hours, wearing an abdominal binder (when recommended), and using over the counter medications are all ways to help with pain management.   -Simple acts like meditation and mindfulness practices after surgery can also help with pain control and research has proven the benefit of these practices.  Medication: Take tylenol and ibuprofen as needed for pain control, alternating every 4-6 hours.  Example:  Tylenol 1000mg @ 6am, 12noon, 6pm, 12midnight (Do not exceed 4000mg of tylenol a day). Ibuprofen 800mg @ 9am, 3pm, 9pm, 3am (Do not exceed 3600mg of ibuprofen a day).  Take Roxicodone for breakthrough pain every 4 hours.  Take Colace for constipation related to narcotic pain medication. If you do not have a bowel movement in 2 days, take Miralax   over the counter.  Drink plenty of water to also prevent constipation.   Contact Information: If you have questions or concerns, please call our office, [phone], Monday- Thursday 8AM-5PM and Friday 8AM-12Noon.  If it is after hours or on the weekend, please call [facility] Main Number, [phone], [phone], and ask to speak to the surgeon on call for Dr. Nuala Chiles at [place].   

## 2023-04-15 NOTE — Interval H&P Note (Signed)
History and Physical Interval Note:  04/15/2023 7:20 AM  Martha Orr  has presented today for surgery, with the diagnosis of CHRONIC ABDOMINAL PAIN, RIGHT LOWER QUADRANT.  The various methods of treatment have been discussed with the patient and family. After consideration of risks, benefits and other options for treatment, the patient has consented to  Procedure(s): LAPAROSCOPY DIAGNOSTIC (N/A) as a surgical intervention.  The patient's history has been reviewed, patient examined, no change in status, stable for surgery.  I have reviewed the patient's chart and labs.  Questions were answered to the patient's satisfaction.    Discussed exploration, possible risk of bleeding, infection, injury to organs, bowel resection, hernia repair, possible mesh.  Lucretia Roers

## 2023-04-15 NOTE — Progress Notes (Signed)
[facility]  Updated patient and husband. Negative diagnostic laparoscopy. Scar noted in the RLQ and local injected in this area to try to do a block, possibly this can help break the pain cycle if the scar is causing her any pain.  Algis Greenhouse, MD [facility] [address] [place], [place] [address] [phone] (office)

## 2023-04-15 NOTE — Op Note (Signed)
[facility] Operative Note  04/15/23  Preoperative Diagnosis:  Chronic right lower quadrant pain    Postoperative Diagnosis: Same   Procedure(s) Performed: Diagnostic laparoscopy   Surgeon: Leatrice Jewels. Henreitta Leber, MD   Assistants: No qualified resident was available    Anesthesia: General endotracheal   Anesthesiologist: Dr. Alva Garnet     Specimens: None    Estimated Blood Loss: Minimal   Blood Replacement: None    Complications: None   Wound Class: Clean   Operative Indications: Martha Orr is a 67 yo with chronic right lower quadrant pain and no explanation on CT scan. We discussed a diagnostic laparoscopy to look in the area and run the bowel. Discusses risk of bleeding, infection, finding a hernia or issue with bowel, needing hernia repair possible use of mesh, needing bowel resection or open surgery, or finding nothing.   Findings: Negative diagnostic laparoscopy          Procedure: The patient was taken to the operating room and placed supine. General endotracheal anesthesia was induced. Intravenous antibiotics were  administered per protocol.  An orogastric tube positioned to decompress the stomach. The left arm was tucked. The abdomen was prepped and draped in the usual sterile fashion.   A supraumbilical incision was made and a Veress needle was used to gain access into the abdomen. A saline drop test confirmed intraperitoneal location and low insufflation pressures. An Optiview 11 mm trocar was placed through the supraumbilical incision under direct visualization. No injury was noted inferior the incision from the port or the Veress.  Additional 5 mm ports were placed under direct visualization the the suprapubic and left lower abdomen area.  The patients abdomen was inspected. There was normal liver and stomach (see photos). Normal omentum blanketed the bowel.  She was placed in head down and right side up position.  The pelvis appeared normal with  normal appearing sigmoid colon. The omentum was pulled back gently with nontraumatic graspers. The cecum appeared normal with minor peritoneal scar to the side wall. The terminal ileum and small bowel appeared normal. The bowel was ran from the terminal ileum back with nontraumatic graspers. No lesions were noted or abnormalities. The bowel was left in anatomic alignment without any twisting. I ran it back to the ligament of treitz.  The cecum and ascending colon appeared normal as did the descending colon and sigmoid colon.  The transverse colon was under omentum and was not able to be visualized. The abdominal wall was normal without hernias. There was a fibrotic appearing scar in the right lower abdomen from her prior appendectomy (see photos).  Given the normal laparoscopy and only finding being the scar in the location of her pain, I think it is possible her scar is causing the pain. A block of Bupivicaine 0.25% was done in the area under direct visualization with the camera. 20cc of local was injected into this region and in the scarred area.  Final inspection revealed acceptable hemostasis.   The 5mm ports were removed under direct visualization. The abdomen was de-sufflated. The 11 mm port sight was closed with 0 Vicryl. The skin was closed with 4-0 Monocryl and dermabond after the remaining 10cc of local was injected.   All counts were correct at the end of the case. The patient was awakened from anesthesia and extubated without complication.  The patient went to the PACU in stable condition.   Algis Greenhouse, MD [facility] [address] [place], [place] [address] [phone] (  office)

## 2023-04-15 NOTE — Anesthesia Postprocedure Evaluation (Signed)
Anesthesia Post Note  Patient: Martha Orr  Procedure(s) Performed: LAPAROSCOPY DIAGNOSTIC (Abdomen)  Patient location during evaluation: Phase II Anesthesia Type: General Level of consciousness: awake and alert and oriented Pain management: pain level controlled Vital Signs Assessment: post-procedure vital signs reviewed and stable Respiratory status: spontaneous breathing, nonlabored ventilation and respiratory function stable Cardiovascular status: blood pressure returned to baseline and stable Postop Assessment: no apparent nausea or vomiting Anesthetic complications: no  No notable events documented.   Last Vitals:  Vitals:   04/15/23 1000 04/15/23 1018  BP: 121/87   Pulse: 95   Resp: 14   Temp:    SpO2: 96% 94%    Last Pain:  Vitals:   04/15/23 1018  TempSrc:   PainSc: 8                  [name] [name]

## 2023-04-15 NOTE — Transfer of Care (Signed)
Immediate Anesthesia Transfer of Care Note  Patient: Martha Orr  Procedure(s) Performed: LAPAROSCOPY DIAGNOSTIC (Abdomen)  Patient Location: PACU  Anesthesia Type:General  Level of Consciousness: sedated  Airway & Oxygen Therapy: Patient Spontanous Breathing and Patient connected to face mask oxygen  Post-op Assessment: Report given to RN and Post -op Vital signs reviewed and stable  Post vital signs: Reviewed and stable  Last Vitals:  Vitals Value Taken Time  BP 124/81 04/15/23 0845  Temp    Pulse 72 04/15/23 0847  Resp 13 04/15/23 0847  SpO2 98 % 04/15/23 0847  Vitals shown include unvalidated device data.  Last Pain:  Vitals:   04/15/23 0636  PainSc: 0-No pain         Complications: No notable events documented.

## 2023-04-15 NOTE — Anesthesia Preprocedure Evaluation (Signed)
Anesthesia Evaluation  Patient identified by MRN, date of birth, ID band Patient awake    Reviewed: Allergy & Precautions, H&P , NPO status , Patient's Chart, lab work & pertinent test results  History of Anesthesia Complications (+) Family history of anesthesia reaction  Airway Mallampati: II  TM Distance: >3 FB Neck ROM: Full    Dental  (+) Dental Advisory Given, Caps   Pulmonary neg pulmonary ROS   Pulmonary exam normal breath sounds clear to auscultation       Cardiovascular Exercise Tolerance: Good hypertension, Pt. on medications Normal cardiovascular exam Rhythm:Regular Rate:Normal     Neuro/Psych  PSYCHIATRIC DISORDERS Anxiety Depression     Neuromuscular disease    GI/Hepatic Neg liver ROS, hiatal hernia,GERD  Medicated, Controlled and Poorly Controlled,,  Endo/Other  Hypothyroidism    Renal/GU negative Renal ROS  negative genitourinary   Musculoskeletal  (+) Arthritis , Osteoarthritis,    Abdominal   Peds negative pediatric ROS (+)  Hematology negative hematology ROS (+)   Anesthesia Other Findings   Reproductive/Obstetrics negative OB ROS                             Anesthesia Physical Anesthesia Plan  ASA: 2  Anesthesia Plan: General   Post-op Pain Management: Minimal or no pain anticipated   Induction: Intravenous  PONV Risk Score and Plan: 4 or greater and Ondansetron and Dexamethasone  Airway Management Planned: Oral ETT  Additional Equipment:   Intra-op Plan:   Post-operative Plan: Extubation in OR  Informed Consent: I have reviewed the patients History and Physical, chart, labs and discussed the procedure including the risks, benefits and alternatives for the proposed anesthesia with the patient or authorized representative who has indicated his/her understanding and acceptance.     Dental advisory given  Plan Discussed with: CRNA and  Surgeon  Anesthesia Plan Comments:        Anesthesia Quick Evaluation

## 2023-04-15 NOTE — Anesthesia Procedure Notes (Signed)
Procedure Name: Intubation Date/Time: 04/15/2023 7:38 AM  Performed by: Minerva Ends, CRNAPre-anesthesia Checklist: Patient identified, Emergency Drugs available, Suction available and Patient being monitored Patient Re-evaluated:Patient Re-evaluated prior to induction Oxygen Delivery Method: Circle System Utilized Preoxygenation: Pre-oxygenation with 100% oxygen Induction Type: IV induction and Cricoid Pressure applied Ventilation: Mask ventilation without difficulty Laryngoscope Size: Miller and 2 Grade View: Grade I Tube type: Oral Tube size: 7.0 mm Number of attempts: 1 Airway Equipment and Method: Stylet Placement Confirmation: ETT inserted through vocal cords under direct vision, positive ETCO2 and breath sounds checked- equal and bilateral Secured at: 22 cm Tube secured with: Tape Dental Injury: Teeth and Oropharynx as per pre-operative assessment  Comments: Smooth IV induction-- intubation AM CRNA atraumatic-- teeth and mouth as preop-- bilat BS

## 2023-04-20 ENCOUNTER — Encounter (HOSPITAL_COMMUNITY): Payer: Self-pay | Admitting: General Surgery

## 2023-04-28 ENCOUNTER — Encounter: Payer: Self-pay | Admitting: General Surgery

## 2023-04-28 ENCOUNTER — Other Ambulatory Visit: Payer: Self-pay

## 2023-04-28 ENCOUNTER — Ambulatory Visit (INDEPENDENT_AMBULATORY_CARE_PROVIDER_SITE_OTHER): Payer: Medicare Other | Admitting: General Surgery

## 2023-04-28 VITALS — BP 116/77 | HR 87 | Temp 98.2°F | Resp 18 | Ht 66.0 in | Wt 186.0 lb

## 2023-04-28 DIAGNOSIS — G8929 Other chronic pain: Secondary | ICD-10-CM

## 2023-04-28 DIAGNOSIS — R1031 Right lower quadrant pain: Secondary | ICD-10-CM

## 2023-04-28 NOTE — Patient Instructions (Signed)
Activity and diet as tolerated.  

## 2023-04-28 NOTE — Progress Notes (Signed)
[facility]  Doing well. She is happy she had the surgery. She says that her pain has improved some.   BP 116/77   Pulse 87   Temp 98.2 F (36.8 C) (Oral)   Resp 18   Ht 5\' 6"  (1.676 m)   Wt 186 lb (84.4 kg)   SpO2 94%   BMI 30.02 kg/m  Port sites c/d/I with peeling dermabond, no erythema or drainage  Patient s/p diagnostic laparoscopy for chronic right lower quadrant pain that was negative for anything obvious that would cause the pain.  She did have scarring from her appendectomy site.   Activity and diet as tolerated.   Algis Greenhouse, MD [facility] [address] [place] [place], [place] [address] [phone] (office)

## 2023-05-24 ENCOUNTER — Telehealth: Payer: Self-pay | Admitting: Internal Medicine

## 2023-05-24 NOTE — Telephone Encounter (Signed)
Patient requests to be called to discuss a RX (not a refill)

## 2023-05-30 ENCOUNTER — Ambulatory Visit: Payer: Medicare Other | Admitting: Internal Medicine

## 2023-05-30 ENCOUNTER — Encounter: Payer: Self-pay | Admitting: Internal Medicine

## 2023-05-30 VITALS — BP 128/77 | HR 80 | Temp 98.4°F | Ht 66.0 in | Wt 189.0 lb

## 2023-05-30 DIAGNOSIS — R0683 Snoring: Secondary | ICD-10-CM

## 2023-05-30 DIAGNOSIS — E669 Obesity, unspecified: Secondary | ICD-10-CM

## 2023-05-30 DIAGNOSIS — Z6830 Body mass index (BMI) 30.0-30.9, adult: Secondary | ICD-10-CM

## 2023-05-30 DIAGNOSIS — F325 Major depressive disorder, single episode, in full remission: Secondary | ICD-10-CM | POA: Diagnosis not present

## 2023-05-30 DIAGNOSIS — R5383 Other fatigue: Secondary | ICD-10-CM

## 2023-05-30 DIAGNOSIS — Z23 Encounter for immunization: Secondary | ICD-10-CM | POA: Diagnosis not present

## 2023-05-30 MED ORDER — SEMAGLUTIDE-WEIGHT MANAGEMENT 1 MG/0.5ML ~~LOC~~ SOAJ
1.0000 mg | SUBCUTANEOUS | 0 refills | Status: DC
Start: 2023-05-30 — End: 2023-09-19

## 2023-05-30 MED ORDER — SEMAGLUTIDE-WEIGHT MANAGEMENT 0.25 MG/0.5ML ~~LOC~~ SOAJ
0.2500 mg | SUBCUTANEOUS | 0 refills | Status: DC
Start: 2023-05-30 — End: 2023-06-24

## 2023-05-30 MED ORDER — SEMAGLUTIDE-WEIGHT MANAGEMENT 2.4 MG/0.75ML ~~LOC~~ SOAJ
2.4000 mg | SUBCUTANEOUS | 11 refills | Status: DC
Start: 2023-05-30 — End: 2023-09-19

## 2023-05-30 MED ORDER — SEMAGLUTIDE-WEIGHT MANAGEMENT 0.5 MG/0.5ML ~~LOC~~ SOAJ
0.5000 mg | SUBCUTANEOUS | 0 refills | Status: DC
Start: 2023-05-30 — End: 2023-09-19

## 2023-05-30 MED ORDER — BUPROPION HCL ER (XL) 150 MG PO TB24
150.0000 mg | ORAL_TABLET | Freq: Every morning | ORAL | 3 refills | Status: DC
Start: 2023-05-30 — End: 2023-08-19

## 2023-05-30 MED ORDER — TOPIRAMATE 25 MG PO TABS
25.0000 mg | ORAL_TABLET | Freq: Two times a day (BID) | ORAL | 3 refills | Status: DC
Start: 2023-05-30 — End: 2023-11-22

## 2023-05-30 MED ORDER — ZEPBOUND 2.5 MG/0.5ML ~~LOC~~ SOAJ: 2.5000 mg | SUBCUTANEOUS | 3 refills | Status: DC

## 2023-05-30 MED ORDER — SEMAGLUTIDE-WEIGHT MANAGEMENT 1.7 MG/0.75ML ~~LOC~~ SOAJ
1.7000 mg | SUBCUTANEOUS | 0 refills | Status: DC
Start: 2023-05-30 — End: 2023-09-19

## 2023-05-30 NOTE — Assessment & Plan Note (Addendum)
Low energy encouraged patient to take vitamin D,

## 2023-05-30 NOTE — Progress Notes (Signed)
[facility]: [phone]   Routine Medical Office Visit  Patient:  Martha Orr      Age: 67 y.o.       Sex:  female  Date:   05/30/2023 PCP:    Lula Olszewski, MD   Today's Healthcare Provider: Lula Olszewski, MD   Assessment and Plan:   I am not confident she will be able to get GLP-1 agonist although her husband gets [place] on same insurance. I therefore sent in several options for her to choose from but encouraged just 1 at a time   Rosea was seen today for weight loss.  Obesity (BMI 30-39.9) -     Semaglutide-Weight Management; Inject 0.25 mg into the skin once a week.  Dispense: 2 mL; Refill: 0 -     Semaglutide-Weight Management; Inject 0.5 mg into the skin once a week.  Dispense: 2 mL; Refill: 0 -     Semaglutide-Weight Management; Inject 1 mg into the skin once a week.  Dispense: 2 mL; Refill: 0 -     Semaglutide-Weight Management; Inject 1.7 mg into the skin once a week.  Dispense: 3 mL; Refill: 0 -     Semaglutide-Weight Management; Inject 2.4 mg into the skin once a week.  Dispense: 3 mL; Refill: 11 -     Zepbound; Inject 2.5 mg into the skin once a week. Body mass index is 30.51 kg/m .  Dispense: 0.5 mL; Refill: 3 -     Topiramate; Take 1 tablet (25 mg total) by mouth 2 (two) times daily. To start: just 1 tablet daily for a week  Dispense: 180 tablet; Refill: 3 -     buPROPion HCl ER (XL); Take 1 tablet (150 mg total) by mouth in the morning.  Dispense: 90 tablet; Refill: 3  Major depressive disorder, single episode, in remission (HCC) Assessment & Plan: Low energy encouraged patient to take vitamin D,   Orders: -     Comprehensive metabolic panel -     CBC with Differential/Platelet -     TSH Rfx on Abnormal to Free T4  Snoring Overview: With severe fatigue and hypertension, never had sleep test.  Orders: -     Ambulatory referral to Sleep Studies  Other fatigue  Need for shingles vaccine -     Varicella-zoster vaccine  IM    Treatment plan discussed and reviewed in detail. Explained medication safety and potential side effects.  Answered all patient questions and confirmed understanding and comfort with the plan. Encouraged patient to contact our office if they have any questions or concerns. Agreed on patient returning to office if symptoms worsen, persist, or new symptoms develop. Discussed precautions in case of needing to visit the Emergency Department.         Clinical Presentation:    67 y.o. female here today for Weight Loss (/)  Based on [employer] AI conversational text extraction:  Discussed the use of AI scribe software for clinical note transcription with the patient, who gave verbal consent to proceed.  History of Present Illness          Reviewed chart data: Active Ambulatory Problems    Diagnosis Date Noted   Bilateral primary osteoarthritis of knee 10/12/2016   Major depressive disorder, single episode, in remission (HCC) 08/02/2019   Anxiety 08/02/2019   Dyslipidemia 08/02/2019   Hypothyroidism 08/02/2019   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   Osteoarthritis 08/02/2019   Restless legs 08/02/2019   Pulmonary nodules 04/23/2020  Otalgia of both ears 04/23/2020   Essential hypertension 02/06/2021   Hyperglycemia 02/06/2021   Hematuria 05/04/2021   Thoracic aortic aneurysm (HCC) 02/09/2022   Allergic rhinitis 02/09/2022   Nausea 09/27/2022   Abdominal pain, chronic, right lower quadrant 09/27/2022   Hepatic steatosis 10/04/2022   Cough, persistent 05/21/2020   Gastroesophageal reflux disease without esophagitis 05/21/2020   Globus pharyngeus 05/21/2020   Lumbar arthropathy 10/08/2022   Occult blood in stools 10/29/2022   Hiatal hernia 02/11/2023   History of colon polyps 02/11/2023   Hemorrhoid 02/11/2023   Obesity (BMI 30-39.9) 05/30/2023   Snoring 05/30/2023   Resolved Ambulatory Problems    Diagnosis Date Noted   History of cholecystectomy 10/04/2022   History of  appendectomy 10/04/2022   History of hysterectomy 10/04/2022   Irritation of left ear 05/21/2020   Past Medical History:  Diagnosis Date   Arthritis    Chronic back pain    Depression    Family history of adverse reaction to anesthesia    Gallstones    High cholesterol    History of shingles    Hypertension    Joint pain    Joint swelling    Microscopic hematuria     Outpatient Medications Prior to Visit  Medication Sig   acetaminophen (TYLENOL) 325 MG tablet Take 325 mg by mouth every 6 (six) hours as needed for moderate pain.   Biotin w/ Vitamins C & E (HAIR/SKIN/NAILS PO) Take 1 capsule by mouth daily.   cholecalciferol (VITAMIN D) 1000 units tablet Take 1,000 Units by mouth at bedtime.   escitalopram (LEXAPRO) 20 MG tablet TAKE 1 TABLET DAILY   fluticasone (FLONASE) 50 MCG/ACT nasal spray Place 2 sprays into both nostrils daily as needed for allergies.   Ginkgo Biloba Extract 60 MG CAPS Take 1 capsule by mouth daily.   hydrochlorothiazide (MICROZIDE) 12.5 MG capsule TAKE 1 CAPSULE DAILY   Omega 3 1000 MG CAPS Take 1 capsule by mouth daily.   omeprazole (PRILOSEC) 40 MG capsule TAKE 1 CAPSULE DAILY AS    NEEDED (Patient taking differently: Take 40 mg by mouth daily.)   rosuvastatin (CRESTOR) 40 MG tablet TAKE 0.5 TABLETS (20 MG TOTAL) BY MOUTH AT BEDTIME. REPLACES 20 MG DOSE (Patient taking differently: Take 40 mg by mouth at bedtime.)   SYNTHROID 112 MCG tablet TAKE 1 TABLET AT BEDTIME   [DISCONTINUED] cyclobenzaprine (FLEXERIL) 5 MG tablet Take 5 mg by mouth 2 (two) times daily as needed for muscle spasms.   Facility-Administered Medications Prior to Visit  Medication Dose Route Frequency Provider   acidophilus (RISAQUAD) capsule 1 capsule  1 capsule Oral Daily Lula Olszewski, MD         Clinical Data Analysis:   Physical Exam  BP 128/77 (BP Location: Left Arm, Patient Position: Sitting)   Pulse 80   Temp 98.4 F (36.9 C) (Temporal)   Ht 5\' 6"  (1.676 m)   Wt 189  lb (85.7 kg)   SpO2 96%   BMI 30.51 kg/m  Wt Readings from Last 10 Encounters:  05/30/23 189 lb (85.7 kg)  04/28/23 186 lb (84.4 kg)  04/15/23 186 lb 15.2 oz (84.8 kg)  04/14/23 186 lb 15.2 oz (84.8 kg)  03/17/23 187 lb (84.8 kg)  02/11/23 187 lb 6.4 oz (85 kg)  01/28/23 179 lb (81.2 kg)  12/08/22 179 lb (81.2 kg)  10/29/22 182 lb 9.6 oz (82.8 kg)  10/08/22 183 lb (83 kg)   Vital signs reviewed.  Nursing notes  reviewed. Weight trend reviewed. Abnormalities and Problem-Specific physical exam findings:  mild truncal adiposity   General Appearance:  No acute distress appreciable.   Well-groomed, healthy-appearing female.  Well proportioned with no abnormal fat distribution.  Good muscle tone. Skin: Clear and well-hydrated. Pulmonary:  Normal work of breathing at rest, no respiratory distress apparent. SpO2: 96 %  Musculoskeletal: All extremities are intact.  Neurological:  Awake, alert, oriented, and engaged.  No obvious focal neurological deficits or cognitive impairments.  Sensorium seems unclouded.   Speech is clear and coherent with logical content. Psychiatric:  Appropriate mood, pleasant and cooperative demeanor, thoughtful and engaged during the exam  Results Reviewed:    No results found for any visits on 05/30/23.  Recent Results (from the past 2160 hour(s))  Basic metabolic panel     Status: Abnormal   Collection Time: 04/14/23  1:26 PM  Result Value Ref Range   Sodium 135 135 - 145 mmol/L   Potassium 3.1 (L) 3.5 - 5.1 mmol/L   Chloride 100 98 - 111 mmol/L   CO2 26 22 - 32 mmol/L   Glucose, Bld 177 (H) 70 - 99 mg/dL    Comment: Glucose reference range applies only to samples taken after fasting for at least 8 hours.   BUN 14 8 - 23 mg/dL   Creatinine, Ser 1.61 0.44 - 1.00 mg/dL   Calcium 9.1 8.9 - 09.6 mg/dL   GFR, Estimated >04 >54 mL/min    Comment: (NOTE) Calculated using the CKD-EPI Creatinine Equation (2021)    Anion gap 9 5 - 15    Comment: Performed at  [facility], [address] [place], [place] [address]    No image results found.   No results found.     Signed: Lula Olszewski, MD 05/30/2023 7:54 PM

## 2023-05-30 NOTE — Patient Instructions (Addendum)
It was a pleasure seeing you today! Your health and satisfaction are our top priorities.   Glenetta Hew, MD  Next Steps:  [x]  Early Intervention: Schedule sooner appointment, call our on-call services, or go to emergency room if there is Increase in pain or discomfort New or worsening symptoms Sudden or severe changes in your health [x]  Flexible Follow-Up: We recommend a Return in about 1 month (around 06/29/2023) for weight loss med management. for optimal routine care. This allows for progress monitoring and treatment adjustments. [x]  Preventive Care: Schedule your annual preventive care visit! It's typically covered by insurance and helps identify potential health issues early. [x]  Lab & X-ray Appointments: Incomplete tests scheduled today, or call to schedule. X-rays: [facility] (M-F, 8:30am-noon or 1pm-5pm). [x]  Medical Information Release: Sign a release form at front desk to obtain relevant medical information we don't have.  Making the Most of Our Focused (20 minute) Appointments:  [x]   Clearly state your top concerns at the beginning of the visit to focus our discussion [x]   If you anticipate you will need more time, please inform the front desk during scheduling - we can book multiple appointments in the same week. [x]   If you have transportation problems- use our convenient video appointments or ask about transportation support. [x]   We can get down to business faster if you use MyChart to update information before the visit and submit non-urgent questions before your visit. Thank you for taking the time to provide details through MyChart.  Let our nurse know and she can import this information into your encounter documents.  Arrival and Wait Times: [x]   Arriving on time ensures that everyone receives prompt attention. [x]   Early morning (8a) and afternoon (1p) appointments tend to have shortest wait times. [x]   Unfortunately, we cannot delay appointments for late  arrivals or hold slots during phone calls.  Getting Answers and Following Up  [x]   Simple Questions & Concerns: For quick questions or basic follow-up after your visit, reach [place] at [phone] or MyChart messaging. [x]   Complex Concerns: If your concern is more complex, scheduling an appointment might be best. Discuss this with the staff to find the most suitable option. [x]   Lab & Imaging Results: We'll contact you directly if results are abnormal or you don't use MyChart. Most normal results will be on MyChart within 2-3 business days, with a review message from Dr. Jon Billings. Haven't heard back in 2 weeks? Need results sooner? Contact us at [phone]. [x]   Referrals: Our referral coordinator will manage specialist referrals. The specialist's office should contact you within 2 weeks to schedule an appointment. Call us if you haven't heard from them after 2 weeks.  Staying Connected  [x]   MyChart: Activate your MyChart for the fastest way to access results and message [place]. See the last page of this paperwork for instructions on how to activate.  Bring to Your Next Appointment  [x]   Medications: Please bring all your medication bottles to your next appointment to ensure we have an accurate record of your prescriptions. [x]   Health Diaries: If you're monitoring any health conditions at home, keeping a diary of your readings can be very helpful for discussions at your next appointment.  Billing  [x]   X-ray & Lab Orders: These are billed by separate companies. Contact the invoicing company directly for questions or concerns. [x]   Visit Charges: Discuss any billing inquiries with our administrative services team.  Your Satisfaction Matters  [x]   Share Your Experience: We strive for your satisfaction! If you have any complaints, or preferably compliments, please let Dr. Jon Billings know directly or contact our Practice Administrators, Edwena Felty or [employer], by asking at the front  desk.   Reviewing Your Records  [x]   Review this early draft of your clinical encounter notes below and the final encounter summary tomorrow on MyChart after its been completed.   Obesity (BMI 30-39.9) -     Semaglutide-Weight Management; Inject 0.25 mg into the skin once a week.  Dispense: 2 mL; Refill: 0 -     Semaglutide-Weight Management; Inject 0.5 mg into the skin once a week.  Dispense: 2 mL; Refill: 0 -     Semaglutide-Weight Management; Inject 1 mg into the skin once a week.  Dispense: 2 mL; Refill: 0 -     Semaglutide-Weight Management; Inject 1.7 mg into the skin once a week.  Dispense: 3 mL; Refill: 0 -     Semaglutide-Weight Management; Inject 2.4 mg into the skin once a week.  Dispense: 3 mL; Refill: 11 -     Zepbound; Inject 2.5 mg into the skin once a week. Body mass index is 30.51 kg/m .  Dispense: 0.5 mL; Refill: 3 -     Topiramate; Take 1 tablet (25 mg total) by mouth 2 (two) times daily. To start: just 1 tablet daily for a week  Dispense: 180 tablet; Refill: 3 -     buPROPion HCl ER (XL); Take 1 tablet (150 mg total) by mouth in the morning.  Dispense: 90 tablet; Refill: 3  Major depressive disorder, single episode, in remission (HCC) Assessment & Plan: Low energy encouraged patient to take vitamin D,   Orders: -     Comprehensive metabolic panel -     CBC with Differential/Platelet -     TSH Rfx on Abnormal to Free T4  Snoring -     Ambulatory referral to Sleep Studies  Other fatigue  Need for shingles vaccine -     Varicella-zoster vaccine IM    Just take 1 weight loss medication(s) at a time at first

## 2023-05-31 ENCOUNTER — Encounter: Payer: Self-pay | Admitting: Internal Medicine

## 2023-05-31 LAB — COMPREHENSIVE METABOLIC PANEL
ALT: 24 U/L (ref 0–35)
AST: 22 U/L (ref 0–37)
Albumin: 4.3 g/dL (ref 3.5–5.2)
Alkaline Phosphatase: 44 U/L (ref 39–117)
BUN: 13 mg/dL (ref 6–23)
CO2: 34 mEq/L — ABNORMAL HIGH (ref 19–32)
Calcium: 9.7 mg/dL (ref 8.4–10.5)
Chloride: 98 mEq/L (ref 96–112)
Creatinine, Ser: 0.8 mg/dL (ref 0.40–1.20)
GFR: 75.35 mL/min (ref >60.00)
Glucose, Bld: 119 mg/dL — ABNORMAL HIGH (ref 70–99)
Potassium: 3.2 mEq/L — ABNORMAL LOW (ref 3.5–5.1)
Sodium: 141 mEq/L (ref 135–145)
Total Bilirubin: 0.3 mg/dL (ref 0.2–1.2)
Total Protein: 7.1 g/dL (ref 6.0–8.3)

## 2023-05-31 LAB — CBC WITH DIFFERENTIAL/PLATELET
Basophils Absolute: 0 10*3/uL (ref 0.0–0.1)
Basophils Relative: 0.7 % (ref 0.0–3.0)
Eosinophils Absolute: 0.3 10*3/uL (ref 0.0–0.7)
Eosinophils Relative: 5.2 % — ABNORMAL HIGH (ref 0.0–5.0)
HCT: 38.1 % (ref 36.0–46.0)
Hemoglobin: 12.9 g/dL (ref 12.0–15.0)
Lymphocytes Relative: 40.8 % (ref 12.0–46.0)
Lymphs Abs: 2.4 10*3/uL (ref 0.7–4.0)
MCHC: 34 g/dL (ref 30.0–36.0)
MCV: 89.3 fl (ref 78.0–100.0)
Monocytes Absolute: 0.7 10*3/uL (ref 0.1–1.0)
Monocytes Relative: 11.6 % (ref 3.0–12.0)
Neutro Abs: 2.5 10*3/uL (ref 1.4–7.7)
Neutrophils Relative %: 41.7 % — ABNORMAL LOW (ref 43.0–77.0)
Platelets: 245 10*3/uL (ref 150.0–400.0)
RBC: 4.26 Mil/uL (ref 3.87–5.11)
RDW: 12.9 % (ref 11.5–15.5)
WBC: 5.9 10*3/uL (ref 4.0–10.5)

## 2023-05-31 LAB — TSH RFX ON ABNORMAL TO FREE T4: TSH: 0.965 u[IU]/mL (ref 0.450–4.500)

## 2023-05-31 NOTE — Progress Notes (Signed)
Dear Ms.Rance,  I hope this message finds you well. I have reviewed your recent bloodwork and would like to share the following observations and recommendations:  Potassium Levels: Your potassium level is slightly low (3.2 mmol/L). This can sometimes contribute to fatigue and other symptoms. I recommend rechecking your potassium levels and evaluating for potential causes of this low level. If necessary, we may consider potassium supplementation.  CO2 Levels: Your CO2 level is slightly high (34 mmol/L). This could possibly indicate a respiratory or metabolic condition. It would be beneficial to assess your overall acid-base balance and respiratory function.  Glucose Levels: Your glucose level is slightly elevated (119 mg/dL). This could suggest a potential issue with glucose metabolism, such as prediabetes or a stress response. Monitoring your glucose levels and recommending lifestyle modifications to address potential issues with glucose metabolism would be beneficial. Further evaluation for prediabetes may be warranted based on these results and any additional risk factors.  Please note that there is no immediate urgency for a follow-up appointment unless your symptoms become more complicated. However, if you have any concerns or if your condition changes, please don't hesitate to reach out.  Your health and well-being are my top priority. I'm here to support you every step of the way.  Best, Lula Olszewski, MD  05/31/2023 4:47 PM   [facility]:  [phone]

## 2023-06-24 ENCOUNTER — Other Ambulatory Visit: Payer: Self-pay

## 2023-06-24 ENCOUNTER — Telehealth: Payer: Self-pay | Admitting: Internal Medicine

## 2023-06-24 DIAGNOSIS — E669 Obesity, unspecified: Secondary | ICD-10-CM

## 2023-06-24 MED ORDER — SEMAGLUTIDE-WEIGHT MANAGEMENT 0.25 MG/0.5ML ~~LOC~~ SOAJ
0.2500 mg | SUBCUTANEOUS | 1 refills | Status: DC
Start: 2023-06-24 — End: 2023-06-27

## 2023-06-24 NOTE — Telephone Encounter (Signed)
Pt would like a call back to discuss a medication she is taking. She has som questions. Please advise.

## 2023-06-24 NOTE — Telephone Encounter (Signed)
Spoke with patient concerning this. 

## 2023-06-27 ENCOUNTER — Other Ambulatory Visit: Payer: Self-pay

## 2023-06-27 DIAGNOSIS — E669 Obesity, unspecified: Secondary | ICD-10-CM

## 2023-06-27 MED ORDER — SEMAGLUTIDE-WEIGHT MANAGEMENT 0.25 MG/0.5ML ~~LOC~~ SOAJ
0.2500 mg | SUBCUTANEOUS | 1 refills | Status: DC
Start: 2023-06-27 — End: 2023-08-19

## 2023-07-14 ENCOUNTER — Ambulatory Visit: Payer: Medicare Other | Admitting: Neurology

## 2023-07-14 ENCOUNTER — Encounter: Payer: Self-pay | Admitting: Neurology

## 2023-07-14 VITALS — BP 107/66 | HR 76 | Ht 66.0 in | Wt 179.8 lb

## 2023-07-14 DIAGNOSIS — R0683 Snoring: Secondary | ICD-10-CM | POA: Diagnosis not present

## 2023-07-14 DIAGNOSIS — E663 Overweight: Secondary | ICD-10-CM

## 2023-07-14 DIAGNOSIS — G4761 Periodic limb movement disorder: Secondary | ICD-10-CM

## 2023-07-14 DIAGNOSIS — G2581 Restless legs syndrome: Secondary | ICD-10-CM

## 2023-07-14 DIAGNOSIS — Z9189 Other specified personal risk factors, not elsewhere classified: Secondary | ICD-10-CM | POA: Diagnosis not present

## 2023-07-14 DIAGNOSIS — G4719 Other hypersomnia: Secondary | ICD-10-CM | POA: Diagnosis not present

## 2023-07-14 DIAGNOSIS — Z820 Family history of epilepsy and other diseases of the nervous system: Secondary | ICD-10-CM

## 2023-07-14 NOTE — Progress Notes (Signed)
Subjective:    Patient ID: Martha Orr is a 67 y.o. female.  HPI    Huston Foley, MD, PhD [facility] [address] [address] [place], [place] [address]  Dear Dr. Jon Billings,  I saw your patient, Martha Orr, upon your kind request in my sleep clinic today for initial consultation of her sleep disorder in particular, concern for underlying obstructive sleep apnea.  The patient is unaccompanied today.  As you know, [name] is a 67 year old female with an underlying medical history of hypertension, hypothyroidism, hyperlipidemia, restless leg syndrome, anxiety, depression, chronic back pain, arthritis, status post bilateral knee replacement surgeries, status post bilateral carpal tunnel surgeries, status post tonsillectomy, and overweight state, who reports snoring, sleep disruption, and excessive daytime somnolence.  Her Epworth sleepiness score is 5 out of 24, fatigue severity score is 48 out of 63.  I reviewed your office note from 05/30/2023.  Per husband's feedback, she moves her feet during sleep.  She has had restless leg symptoms for the past few years, usually takes Tylenol PM at night to help her symptoms, restless leg symptoms started after her bilateral knee replacement surgeries in 2017.  She reports that 2 of her sisters have RLS as well.  She also reports that 2 brothers and 1 sister have sleep apnea.  She goes to bed between 11 and midnight and rise time is between 9 and 10.  She does not have nightly nocturia or recurrent morning headaches.  She is retired, she worked as a Geophysical data processor.  She had a tonsillectomy.  Weight has been slowly coming down.  She does not watch TV in her bedroom.  She lives with her husband, they have 2 grown children, ages 66 and 90.  They have 1 dog in the household.  She drinks caffeine in the form of coffee, 1 cup in the morning, occasional alcohol, she is a non-smoker.  She has been on Lexapro for over 20  years.  Her Past Medical History Is Significant For: Past Medical History:  Diagnosis Date   Abdominal pain, chronic, right lower quadrant 09/27/2022   67 year old female Abdominal pain across her entire right side but has remote removal of uterus, appendix, and gallbladder. Pain started 10/07/22.  Nausea and abdominal pain on the right, often occurs after eating.  Does not have any diarrhea or constipation.  Does not improve with stool softening.  Seems worse after large meal.  Partial response to bentyl.   Is not affected by urinating does have   Anxiety    Arthritis    Chronic back pain    buldging disc   Depression    takes Lexapro daily   Family history of adverse reaction to anesthesia    sister gets sick after anesthesia   Gallstones    Hepatic steatosis 10/04/2022   High cholesterol    takes Crestor daily   History of appendectomy 10/04/2022   History of cholecystectomy 10/04/2022   History of colon polyps    benign   History of hysterectomy 10/04/2022   History of shingles    Hypertension    Hypothyroidism    takes Synthroid daily   Joint pain    Joint swelling    Microscopic hematuria    states her entire life and family is the same way   Nausea 09/27/2022    Her Past Surgical History Is Significant For: Past Surgical History:  Procedure Laterality Date   ABDOMINAL HYSTERECTOMY  1998  APPENDECTOMY     BREAST BIOPSY     BUNIONECTOMY Bilateral    CARPAL TUNNEL RELEASE Bilateral    CHOLECYSTECTOMY     COLONOSCOPY     KNEE ARTHROSCOPY Right    KNEE ARTHROSCOPY Left 11/01/2017   Procedure: ARTHROSCOPY LEFT KNEE;  Surgeon: Marcene Corning, MD;  Location: [facility] OR;  Service: Orthopedics;  Laterality: Left;   knot removed from right breast     LAPAROSCOPY N/A 04/15/2023   Procedure: LAPAROSCOPY DIAGNOSTIC;  Surgeon: Lucretia Roers, MD;  Location: [facility];  Service: General;  Laterality: N/A;   NASAL SINUS SURGERY     TONSILLECTOMY     TONSILLECTOMY AND  ADENOIDECTOMY  2002   TOTAL KNEE ARTHROPLASTY Bilateral 10/12/2016   Procedure: TOTAL KNEE BILATERAL;  Surgeon: Marcene Corning, MD;  Location: [facility] OR;  Service: Orthopedics;  Laterality: Bilateral;    Her Family History Is Significant For: Family History  Problem Relation Age of Onset   Heart attack Mother    Hyperlipidemia Mother    Arthritis Father    Arthritis Sister    Arthritis Sister    Heart attack Sister    Arthritis Sister    Hyperlipidemia Sister    Stroke Sister    Sleep apnea Brother    Cancer Brother    Depression Brother    Heart attack Brother    Heart disease Brother    Hypertension Brother    Hyperlipidemia Brother    Cancer Brother    Heart attack Brother    Colon cancer Neg Hx    Esophageal cancer Neg Hx    Rectal cancer Neg Hx    Stomach cancer Neg Hx     Her Social History Is Significant For: Social History   Socioeconomic History   Marital status: Married    Spouse name: Not on file   Number of children: 2   Years of education: Not on file   Highest education level: Not on file  Occupational History   Not on file  Tobacco Use   Smoking status: Never   Smokeless tobacco: Never  Vaping Use   Vaping status: Never Used  Substance and Sexual Activity   Alcohol use: Yes    Comment: occasionally   Drug use: No   Sexual activity: Not on file  Other Topics Concern   Not on file  Social History Narrative   Not on file   Social Determinants of Health   Financial Resource Strain: Low Risk  (02/08/2022)   Overall Financial Resource Strain (CARDIA)    Difficulty of Paying Living Expenses: Not hard at all  Food Insecurity: No Food Insecurity (02/08/2022)   Hunger Vital Sign    Worried About Running Out of Food in the Last Year: Never true    Ran Out of Food in the Last Year: Never true  Transportation Needs: No Transportation Needs (02/08/2022)   PRAPARE - Administrator, Civil Service (Medical): No    Lack of Transportation  (Non-Medical): No  Physical Activity: Inactive (02/08/2022)   Exercise Vital Sign    Days of Exercise per Week: 0 days    Minutes of Exercise per Session: 0 min  Stress: No Stress Concern Present (05/29/2023)   [employer] of Occupational Health - Occupational Stress Questionnaire    Feeling of Stress : Not at all  Social Connections: Moderately Integrated (05/29/2023)   Social Connection and Isolation Panel [NHANES]    Frequency of Communication with Friends and Family: More than  three times a week    Frequency of Social Gatherings with Friends and Family: Once a week    Attends Religious Services: More than 4 times per year    Active Member of [employer] or Organizations: No    Attends Engineer, structural: Not on file    Marital Status: Married    Her Allergies Are:  No Known Allergies:   Her Current Medications Are:  Outpatient Encounter Medications as of 07/14/2023  Medication Sig   acetaminophen (TYLENOL) 325 MG tablet Take 325 mg by mouth every 6 (six) hours as needed for moderate pain.   Biotin w/ Vitamins C & E (HAIR/SKIN/NAILS PO) Take 1 capsule by mouth daily.   cholecalciferol (VITAMIN D) 1000 units tablet Take 1,000 Units by mouth at bedtime.   escitalopram (LEXAPRO) 20 MG tablet TAKE 1 TABLET DAILY   fluticasone (FLONASE) 50 MCG/ACT nasal spray Place 2 sprays into both nostrils daily as needed for allergies.   Ginkgo Biloba Extract 60 MG CAPS Take 1 capsule by mouth daily.   hydrochlorothiazide (MICROZIDE) 12.5 MG capsule TAKE 1 CAPSULE DAILY   Omega 3 1000 MG CAPS Take 1 capsule by mouth daily.   omeprazole (PRILOSEC) 40 MG capsule TAKE 1 CAPSULE DAILY AS    NEEDED (Patient taking differently: Take 40 mg by mouth daily.)   rosuvastatin (CRESTOR) 40 MG tablet TAKE 0.5 TABLETS (20 MG TOTAL) BY MOUTH AT BEDTIME. REPLACES 20 MG DOSE (Patient taking differently: Take 40 mg by mouth at bedtime.)   Semaglutide-Weight Management 0.25 MG/0.5ML SOAJ Inject 0.25 mg into the  skin once a week.   SYNTHROID 112 MCG tablet TAKE 1 TABLET AT BEDTIME   tirzepatide (ZEPBOUND) 2.5 MG/0.5ML Pen Inject 2.5 mg into the skin once a week. Body mass index is 30.51 kg/m .   topiramate (TOPAMAX) 25 MG tablet Take 1 tablet (25 mg total) by mouth 2 (two) times daily. To start: just 1 tablet daily for a week   buPROPion (WELLBUTRIN XL) 150 MG 24 hr tablet Take 1 tablet (150 mg total) by mouth in the morning. (Patient not taking: Reported on 07/14/2023)   Semaglutide-Weight Management 0.5 MG/0.5ML SOAJ Inject 0.5 mg into the skin once a week. (Patient not taking: Reported on 07/14/2023)   Semaglutide-Weight Management 1 MG/0.5ML SOAJ Inject 1 mg into the skin once a week. (Patient not taking: Reported on 07/14/2023)   Semaglutide-Weight Management 1.7 MG/0.75ML SOAJ Inject 1.7 mg into the skin once a week. (Patient not taking: Reported on 07/14/2023)   Semaglutide-Weight Management 2.4 MG/0.75ML SOAJ Inject 2.4 mg into the skin once a week. (Patient not taking: Reported on 07/14/2023)   Facility-Administered Encounter Medications as of 07/14/2023  Medication   acidophilus (RISAQUAD) capsule 1 capsule  :   Review of Systems:  Out of a complete 14 point review of systems, all are reviewed and negative with the exception of these symptoms as listed below:   Review of Systems  Neurological:        Pt here for sleep consult  Pt snores,fatigue,little headaches,RLS ,controlled hypertension Pt states sleep study 20 years ago . Pt denies CPAP machine    ESS:5 FSS:48    Objective:  Neurological Exam  Physical Exam Physical Examination:   Vitals:   07/14/23 0909  BP: 107/66  Pulse: 76    General Examination: The patient is a very pleasant 67 y.o. female in no acute distress. She appears well-developed and well-nourished and well groomed.   HEENT: Normocephalic, atraumatic, pupils  are equal, round and reactive to light, extraocular tracking is good without limitation to gaze  excursion or nystagmus noted. Hearing is grossly intact. Face is symmetric with normal facial animation. Speech is clear with no dysarthria noted. There is no hypophonia. There is no lip, neck/head, jaw or voice tremor. Neck is supple with full range of passive and active motion. There are no carotid bruits on auscultation. Oropharynx exam reveals: mild mouth dryness, adequate dental hygiene and moderate airway crowding, due to small airway entry and redundant soft palate, Mallampati class II, minimal overbite noted.  Tongue protrudes centrally and palate elevates symmetrically, neck circumference 15-3/8 inches.  Chest: Clear to auscultation without wheezing, rhonchi or crackles noted.  Heart: S1+S2+0, regular and normal without murmurs, rubs or gallops noted.   Abdomen: Soft, non-tender and non-distended.  Extremities: There is no pitting edema in the distal lower extremities bilaterally.   Skin: Warm and dry without trophic changes noted.   Musculoskeletal: exam reveals no obvious joint deformities.   Neurologically:  Mental status: The patient is awake, alert and oriented in all 4 spheres. Her immediate and remote memory, attention, language skills and fund of knowledge are appropriate. There is no evidence of aphasia, agnosia, apraxia or anomia. Speech is clear with normal prosody and enunciation. Thought process is linear. Mood is normal and affect is normal.  Cranial nerves II - XII are as described above under HEENT exam.  Motor exam: Normal bulk, strength and tone is noted. There is no obvious action or resting tremor.  Fine motor skills and coordination: grossly intact.  Cerebellar testing: No dysmetria or intention tremor. There is no truncal or gait ataxia.  Sensory exam: intact to light touch in the upper and lower extremities.  Gait, station and balance: She stands easily. No veering to one side is noted. No leaning to one side is noted. Posture is age-appropriate and stance is  narrow based. Gait shows normal stride length and normal pace. No problems turning are noted.   Assessment and Plan:   In summary, ZADIA UHDE is a very pleasant 67 y.o.-year old female with an underlying medical history of hypertension, hypothyroidism, hyperlipidemia, restless leg syndrome, anxiety, depression, chronic back pain, arthritis, status post bilateral knee replacement surgeries, status post bilateral carpal tunnel surgeries, status post tonsillectomy, and overweight state, whose history and physical exam are concerning for sleep disordered breathing, particularly obstructive sleep apnea (OSA) versus upper airway resistance syndrome (UARS) versus central sleep apnea (CSA), or mixed sleep apnea. A laboratory attended sleep study is typically considered "gold standard" for evaluation of sleep disordered breathing.  A laboratory study will allow [place] to look for her leg twitching at night. I had a long chat with the patient about my findings and the diagnosis of sleep apnea, particularly OSA, its prognosis and treatment options. We talked about medical/conservative treatments, surgical interventions and non-pharmacological approaches for symptom control. I explained, in particular, the risks and ramifications of untreated moderate to severe OSA, especially with respect to developing cardiovascular disease down the road, including congestive heart failure (CHF), difficult to treat hypertension, cardiac arrhythmias (particularly A-fib), neurovascular complications including TIA, stroke and dementia. Even type 2 diabetes has, in part, been linked to untreated OSA. Symptoms of untreated OSA may include (but may not be limited to) daytime sleepiness, nocturia (i.e. frequent nighttime urination), memory problems, mood irritability and suboptimally controlled or worsening mood disorder such as depression and/or anxiety, lack of energy, lack of motivation, physical discomfort, as well as recurrent  headaches,  especially morning or nocturnal headaches. We talked about the importance of maintaining a healthy lifestyle and striving for healthy weight. In addition, we talked about the importance of striving for and maintaining good sleep hygiene. I recommended a sleep study at this time. I outlined the differences between a laboratory attended sleep study which is considered more comprehensive and accurate over the option of a home sleep test (HST); the latter may lead to underestimation of sleep disordered breathing in some instances and does not help with diagnosing upper airway resistance syndrome and is not accurate enough to diagnose primary central sleep apnea typically. I outlined possible surgical and non-surgical treatment options of OSA, including the use of a positive airway pressure (PAP) device (i.e. CPAP, AutoPAP/APAP or BiPAP in certain circumstances), a custom-made dental device (aka oral appliance, which would require a referral to a specialist dentist or orthodontist typically, and is generally speaking not considered for patients with full dentures or edentulous state), upper airway surgical options, such as traditional UPPP (which is not considered a first-line treatment) or the Inspire device (hypoglossal nerve stimulator, which would involve a referral for consultation with an ENT surgeon, after careful selection, following inclusion criteria - also not first-line treatment). I explained the PAP treatment option to the patient in detail, as this is generally considered first-line treatment.  The patient indicated that she would be willing to try PAP therapy, if the need arises. I explained the importance of being compliant with PAP treatment, not only for insurance purposes but primarily to improve patient's symptoms symptoms, and for the patient's long term health benefit, including to reduce Her cardiovascular risks longer-term.    We will pick up our discussion about the next steps and treatment  options after testing.  We will keep her posted as to the test results by phone call and/or MyChart messaging where possible.  We will plan to follow-up in sleep clinic accordingly as well.  I answered all her questions today and the patient was in agreement.   I encouraged her to call with any interim questions, concerns, problems or updates or email [place] through MyChart.  Generally speaking, sleep test authorizations may take up to 2 weeks, sometimes less, sometimes longer, the patient is encouraged to get in touch with [place] if they do not hear back from the sleep lab staff directly within the next 2 weeks.  Thank you very much for allowing me to participate in the care of this nice patient. If I can be of any further assistance to you please do not hesitate to call me at [phone].  Sincerely,   Huston Foley, MD, PhD

## 2023-07-14 NOTE — Patient Instructions (Signed)

## 2023-07-27 ENCOUNTER — Encounter (INDEPENDENT_AMBULATORY_CARE_PROVIDER_SITE_OTHER): Payer: Self-pay

## 2023-07-30 ENCOUNTER — Other Ambulatory Visit: Payer: Self-pay | Admitting: Family Medicine

## 2023-08-09 ENCOUNTER — Telehealth: Payer: Self-pay | Admitting: Neurology

## 2023-08-09 NOTE — Telephone Encounter (Signed)
8/5:lvm-mla 07/14/23 [employer] medicare no auth req EE

## 2023-08-19 ENCOUNTER — Encounter: Payer: Self-pay | Admitting: Internal Medicine

## 2023-08-19 ENCOUNTER — Ambulatory Visit: Payer: Medicare Other | Admitting: Internal Medicine

## 2023-08-19 VITALS — BP 108/70 | HR 68 | Temp 98.1°F | Ht 66.0 in | Wt 177.0 lb

## 2023-08-19 DIAGNOSIS — R918 Other nonspecific abnormal finding of lung field: Secondary | ICD-10-CM

## 2023-08-19 DIAGNOSIS — M79642 Pain in left hand: Secondary | ICD-10-CM

## 2023-08-19 DIAGNOSIS — E039 Hypothyroidism, unspecified: Secondary | ICD-10-CM

## 2023-08-19 DIAGNOSIS — I712 Thoracic aortic aneurysm, without rupture, unspecified: Secondary | ICD-10-CM

## 2023-08-19 DIAGNOSIS — R638 Other symptoms and signs concerning food and fluid intake: Secondary | ICD-10-CM | POA: Diagnosis not present

## 2023-08-19 DIAGNOSIS — F325 Major depressive disorder, single episode, in full remission: Secondary | ICD-10-CM

## 2023-08-19 DIAGNOSIS — R195 Other fecal abnormalities: Secondary | ICD-10-CM

## 2023-08-19 DIAGNOSIS — G5603 Carpal tunnel syndrome, bilateral upper limbs: Secondary | ICD-10-CM | POA: Diagnosis not present

## 2023-08-19 DIAGNOSIS — R110 Nausea: Secondary | ICD-10-CM

## 2023-08-19 MED ORDER — DICLOFENAC SODIUM 1 % EX GEL: 4.0000 g | Freq: Four times a day (QID) | CUTANEOUS | 3 refills | Status: DC | PRN

## 2023-08-19 MED ORDER — BUPROPION HCL ER (XL) 300 MG PO TB24: 300.0000 mg | ORAL_TABLET | Freq: Every morning | ORAL | 3 refills | Status: DC

## 2023-08-19 MED ORDER — ZEPBOUND 2.5 MG/0.5ML ~~LOC~~ SOAJ: SUBCUTANEOUS | 3 refills | Status: DC

## 2023-08-19 NOTE — Assessment & Plan Note (Addendum)
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 We will order a repeat CT scan to monitor for progression and refer her to cardiology for further evaluation, including a potential echocardiogram and stress test.

## 2023-08-19 NOTE — Progress Notes (Signed)
[facility]: [phone]   -- Routine Medical Office Visit --  Patient:  Martha Orr      Age: 67 y.o.       Sex:  female  Date:   08/19/2023 Patient Care Team: Lula Olszewski, MD as PCP - General (Internal Medicine) Corky Crafts, MD as PCP - Cardiology (Cardiology) Jeannett Senior, MD as Consulting Physician (Obstetrics and Gynecology) Exie Parody, MD as Consulting Physician (Endocrinology) Marcelino Duster, MD as Referring Physician (Dermatology) Marcene Corning, MD as Consulting Physician (Orthopedic Surgery) Johnnette Gourd, DDS as Consulting Physician (Dentistry) Raymond Gurney, PT as Consulting Physician (Physical Therapy) Claria Dice, MD as Attending Physician (Physical Medicine and Rehabilitation) Unk Lightning, [place] as Physician Assistant (Gastroenterology) Lucretia Roers, MD as Consulting Physician (General Surgery) Huston Foley, MD as Attending Physician (Sleep Medicine) Today's Healthcare Provider: Lula Olszewski, MD      Assessment & Plan Thoracic aortic aneurysm without rupture, unspecified part [facility]) A mild aneurysmal dilatation of the thoracic aorta was noted on a CT scan from March 2023, with no current symptoms. We will order a repeat CT scan to monitor for progression and refer her to cardiology for further evaluation, including a potential echocardiogram and stress test. Hand pain, left Post-operative hand pain and weakness Following hand surgery in May, she exhibits pain and weakness, likely due to nerve injury with inflammation. She is on pain medication and attending physical therapy. We will order an EMG to identify potential nerve damage, continue physical therapy, start B12 supplementation to support nerve healing, and apply Voltaren gel for inflammation.  Inflammation post surgical associated with weakness/tremor since carpal tunnel syndrome suspicious for surgical complication not responding well to  physical therapy Encouraged patient to take NSAIDs Encouraged patient to continue(s) with physical therapy Encouraged patient to see her hand surgeon again I will get eeg to assess for suspected never Bilateral carpal tunnel syndrome  Acquired hypothyroidism  Major depressive disorder, single episode, in remission (HCC)  Nausea  Occult blood in stools  Pulmonary nodules  Weight disorder She discontinued Wegovy due to side effects and is currently benefiting from Wellbutrin. She expressed interest in Zepbound. We will continue Wellbutrin and start Zepbound injection for weight loss, pending approval by the pharmacy benefits manager.   General Health Maintenance We will continue her current medications, including cholesterol medication, consider increasing the dose of Wellbutrin for additional energy, and continue monitoring thyroid function, last checked in June.        ED Discharge Orders          Ordered    Zoster Recombinant (Shingrix )        08/19/23 0828    tirzepatide (ZEPBOUND) 2.5 MG/0.5ML Pen  Weekly        08/19/23 0835    diclofenac Sodium (VOLTAREN) 1 % GEL  4 times daily PRN        08/19/23 0858    CT Angio Chest W/Cm &/Or Wo Cm       Comments: Specimen [id number]: UHCepicWT 177No DiabetesNo allergies to contrast dyeNo kidney cancer/kidney surgery/yes both kidneysNo special needs/No spinal cord stimulator/body injector/glucose monitor/port attached)If you need to cancel your appt, please do so 24 hrs prior to your appointment to avoid getting charged a no show fee of $75PT is aware/PT verbal understood instructions given    08/19/23 0858    Ambulatory referral to Cardiology        08/19/23 0858    NCV with EMG(electromyography)  08/19/23 0858    buPROPion (WELLBUTRIN XL) 300 MG 24 hr tablet  Every morning        08/19/23 0859          Recommended follow up: No follow-ups on file. Future Appointments  Date Time Provider Department Center   08/25/2023  3:00 PM GI-315 CT 2 GI-315CT [facility]  11/22/2023  8:20 AM Lula Olszewski, MD [facility]            Subjective   67 y.o. female who has Bilateral primary osteoarthritis of knee; Major depressive disorder, single episode, in remission (HCC); Anxiety; Dyslipidemia; Hypothyroidism; H/O cold sores; Osteoarthritis; Restless legs; Pulmonary nodules; Otalgia of both ears; Essential hypertension; Hyperglycemia; Hematuria; Thoracic aortic aneurysm (HCC); Allergic rhinitis; Nausea; Hepatic steatosis; Cough, persistent; Gastroesophageal reflux disease without esophagitis; Globus pharyngeus; Lumbar arthropathy; Occult blood in stools; Hiatal hernia; History of colon polyps; Hemorrhoid; Obesity (BMI 30-39.9); Snoring; and Hand pain, left on their problem list. Her reasons/main concerns/chief complaints for today's office visit are 2 month follow-up (Requesting a replacement for [facility]. Made her feel nauseous and fatigue.)   ------------------------------------------------------------------------------------------------------------------------ AI-Extracted: Discussed the use of AI scribe software for clinical note transcription with the patient, who gave verbal consent to proceed.  History of Present Illness   The patient, with a history of abdominal pain and weight management issues, presents for a routine six-month follow-up. She reports intermittent abdominal discomfort, which she describes as tolerable. The patient also reports issues with her hand, which has been causing significant discomfort and functional impairment. She underwent surgery for this issue in May, but has since experienced persistent pain, weakness, and reduced coordination. The patient is scheduled to see her orthopedic surgeon for further evaluation.  The patient had been on [facility] for weight management but discontinued it due to side effects including lethargy and persistent nausea. She expresses interest in an  alternative injectable medication for weight management, despite acknowledging the potential cost barriers. She is currently taking Wellbutrin, which she reports as beneficial for energy levels but not significantly effective for weight loss.  The patient also has a history of right-sided abdominal cramps, which she reports as currently manageable. She has stopped attending physical therapy for her hand issue on the advice of her therapist, who recommended further medical evaluation. The patient reports a persistent tremor in her hand, which she attributes to the recent surgery.  The patient has a history of aortic atherosclerosis and is due for a follow-up CT scan. She also reports a history of blood in her urine, which she states has been a lifelong issue. She has had a colonoscopy within the last year and is due for a follow-up in the coming year. The patient also reports restless leg syndrome, which she manages with Tylenol PM.    ------------------------------------------------------------------------------------------------------------------------ She has a past medical history of Abdominal pain, chronic, right lower quadrant (09/27/2022), Anxiety, Arthritis, Chronic back pain, Depression, Family history of adverse reaction to anesthesia, Gallstones, Hepatic steatosis (10/04/2022), High cholesterol, History of appendectomy (10/04/2022), History of cholecystectomy (10/04/2022), History of colon polyps, History of hysterectomy (10/04/2022), History of shingles, Hypertension, Hypothyroidism, Joint pain, Joint swelling, Microscopic hematuria, and Nausea (09/27/2022).  Problem list overviews that were updated at today's visit: Problem  Hand Pain, Left   Had surgery on it for carpal tunnel syndrome May 05 2023 Since then reports weakness and pain pressure sensitivity. Tried physical therapy     Occult Blood in Stools   X 2/2 in oct 2023- no blood  noted in stools Had colonoscopy plan 1 year(s) f/u    Nausea   Seems was MVHQIO induced Associated with r sided abdominal cramps Postmenopausal over 10 years ago and s/p hysterectomy No recent heartburn and takes ppi No recent constipation Gallbladder removed 18 years ago, and diarrhea ever since but she denies diarrhea associated with nausea and r sided pain Takes pepto bismol, never maalox, stopped tums Takes vinegar and cranberry juice about a tablespoon daily.  - symptoms started prior to this.    Beer and tomato juice occasional Denies any bloating or gas    Major Depressive Disorder, Single Episode, in Remission (Hcc)      08/19/2023    8:23 AM 02/11/2023    8:00 AM 02/09/2022    9:27 AM  PHQ9 SCORE ONLY  PHQ-9 Total Score 0 0 0      Hypothyroidism       Latest Ref Rng & Units 05/30/2023    4:20 PM 02/09/2022   10:12 AM 04/14/2021   12:04 PM 02/06/2021   10:39 AM 11/13/2019    1:34 PM  THYROID  TSH 0.450 - 4.500 uIU/mL 0.965  1.19  1.89  2.89  1.92    anti-TPO antibodies: N/A  anti-Tg antibodies: N/A  Anti-TSI antibodies: N/A   Family history of thyroid disease: Patient last evaluated for thyroid nodules: Last ultrasound for thyroid nodules: N/A  Last heart exam for rhythm check:      Current Outpatient Medications on File Prior to Visit  Medication Sig   acetaminophen (TYLENOL) 325 MG tablet Take 325 mg by mouth every 6 (six) hours as needed for moderate pain.   Biotin w/ Vitamins C & E (HAIR/SKIN/NAILS PO) Take 1 capsule by mouth daily.   cholecalciferol (VITAMIN D) 1000 units tablet Take 1,000 Units by mouth at bedtime.   escitalopram (LEXAPRO) 20 MG tablet TAKE 1 TABLET DAILY   fluticasone (FLONASE) 50 MCG/ACT nasal spray Place 2 sprays into both nostrils daily as needed for allergies.   Ginkgo Biloba Extract 60 MG CAPS Take 1 capsule by mouth daily.   hydrochlorothiazide (MICROZIDE) 12.5 MG capsule TAKE 1 CAPSULE DAILY   Omega 3 1000 MG CAPS Take 1 capsule by mouth daily.   omeprazole (PRILOSEC) 40 MG capsule  TAKE 1 CAPSULE DAILY AS    NEEDED (Patient taking differently: Take 40 mg by mouth daily.)   rosuvastatin (CRESTOR) 40 MG tablet TAKE 0.5 TABLETS (20 MG TOTAL) BY MOUTH AT BEDTIME. REPLACES 20 MG DOSE (Patient taking differently: Take 40 mg by mouth at bedtime.)   SYNTHROID 112 MCG tablet TAKE 1 TABLET AT BEDTIME   topiramate (TOPAMAX) 25 MG tablet Take 1 tablet (25 mg total) by mouth 2 (two) times daily. To start: just 1 tablet daily for a week   Semaglutide-Weight Management 0.5 MG/0.5ML SOAJ Inject 0.5 mg into the skin once a week. (Patient not taking: Reported on 07/14/2023)   Semaglutide-Weight Management 1 MG/0.5ML SOAJ Inject 1 mg into the skin once a week. (Patient not taking: Reported on 07/14/2023)   Semaglutide-Weight Management 1.7 MG/0.75ML SOAJ Inject 1.7 mg into the skin once a week. (Patient not taking: Reported on 07/14/2023)   Semaglutide-Weight Management 2.4 MG/0.75ML SOAJ Inject 2.4 mg into the skin once a week. (Patient not taking: Reported on 07/14/2023)   Current Facility-Administered Medications on File Prior to Visit  Medication   acidophilus (RISAQUAD) capsule 1 capsule   Medications Discontinued During This Encounter  Medication Reason   Semaglutide-Weight Management 0.25 MG/0.5ML SOAJ  Patient Preference   Ascorbic Acid (VITAMIN C PO)    tirzepatide (ZEPBOUND) 2.5 MG/0.5ML Pen    buPROPion (WELLBUTRIN XL) 150 MG 24 hr tablet Reorder        Objective    Physical Exam  BP 108/70 (BP Location: Left Arm, Patient Position: Sitting)   Pulse 68   Temp 98.1 F (36.7 C) (Temporal)   Ht 5\' 6"  (1.676 m)   Wt 177 lb (80.3 kg)   SpO2 96%   BMI 28.57 kg/m  Wt Readings from Last 10 Encounters:  08/19/23 177 lb (80.3 kg)  07/14/23 179 lb 12.8 oz (81.6 kg)  05/30/23 189 lb (85.7 kg)  04/28/23 186 lb (84.4 kg)  04/15/23 186 lb 15.2 oz (84.8 kg)  04/14/23 186 lb 15.2 oz (84.8 kg)  03/17/23 187 lb (84.8 kg)  02/11/23 187 lb 6.4 oz (85 kg)  01/28/23 179 lb (81.2 kg)   12/08/22 179 lb (81.2 kg)   Vital signs reviewed.  Nursing notes reviewed. Weight trend reviewed. Abnormalities and Problem-Specific physical exam findings:  hand weakness left can't hold steady under load.  General Appearance:  No acute distress appreciable.   Well-groomed, healthy-appearing female.  Well proportioned with no abnormal fat distribution.  Good muscle tone. Pulmonary:  Normal work of breathing at rest, no respiratory distress apparent. SpO2: 96 %  Musculoskeletal: All extremities are intact.  Neurological:  Awake, alert, oriented, and engaged.  No obvious focal neurological deficits or cognitive impairments.  Sensorium seems unclouded.   Speech is clear and coherent with logical content. Psychiatric:  Appropriate mood, pleasant and cooperative demeanor, thoughtful and engaged during the exam  Results   LABS Occult blood: Positive (09/2022)  RADIOLOGY Chest CT: Mild aneurysmal dilatation of thoracic aorta (02/2022)  DIAGNOSTIC Colonoscopy: Normal (2023)        No results found for any visits on 08/19/23.  Office Visit on 05/30/2023  Component Date Value   Sodium 05/30/2023 141    Potassium 05/30/2023 3.2 (L)    Chloride 05/30/2023 98    CO2 05/30/2023 34 (H)    Glucose, Bld 05/30/2023 119 (H)    BUN 05/30/2023 13    Creatinine, Ser 05/30/2023 0.80    Total Bilirubin 05/30/2023 0.3    Alkaline Phosphatase 05/30/2023 44    AST 05/30/2023 22    ALT 05/30/2023 24    Total Protein 05/30/2023 7.1    Albumin 05/30/2023 4.3    GFR 05/30/2023 75.35    Calcium 05/30/2023 9.7    WBC 05/30/2023 5.9    RBC 05/30/2023 4.26    Hemoglobin 05/30/2023 12.9    HCT 05/30/2023 38.1    MCV 05/30/2023 89.3    MCHC 05/30/2023 34.0    RDW 05/30/2023 12.9    Platelets 05/30/2023 245.0    Neutrophils Relative % 05/30/2023 41.7 (L)    Lymphocytes Relative 05/30/2023 40.8    Monocytes Relative 05/30/2023 11.6    Eosinophils Relative 05/30/2023 5.2 (H)    Basophils Relative  05/30/2023 0.7    Neutro Abs 05/30/2023 2.5    Lymphs Abs 05/30/2023 2.4    Monocytes Absolute 05/30/2023 0.7    Eosinophils Absolute 05/30/2023 0.3    Basophils Absolute 05/30/2023 0.0    TSH 05/30/2023 0.965   Hospital Outpatient Visit on 04/14/2023  Component Date Value   Sodium 04/14/2023 135    Potassium 04/14/2023 3.1 (L)    Chloride 04/14/2023 100    CO2 04/14/2023 26    Glucose, Bld 04/14/2023 177 (H)  BUN 04/14/2023 14    Creatinine, Ser 04/14/2023 0.82    Calcium 04/14/2023 9.1    GFR, Estimated 04/14/2023 >60    Anion gap 04/14/2023 9   Lab on 10/18/2022  Component Date Value   Fecal Occult Bld 10/18/2022 Positive (A)   Lab on 10/18/2022  Component Date Value   WBC 10/18/2022 5.7    RBC 10/18/2022 4.51    Hemoglobin 10/18/2022 13.7    HCT 10/18/2022 40.3    MCV 10/18/2022 89.2    MCHC 10/18/2022 33.9    RDW 10/18/2022 13.1    Platelets 10/18/2022 266.0    Neutrophils Relative % 10/18/2022 47.3    Lymphocytes Relative 10/18/2022 38.2    Monocytes Relative 10/18/2022 11.3    Eosinophils Relative 10/18/2022 2.8    Basophils Relative 10/18/2022 0.4    Neutro Abs 10/18/2022 2.7    Lymphs Abs 10/18/2022 2.2    Monocytes Absolute 10/18/2022 0.6    Eosinophils Absolute 10/18/2022 0.2    Basophils Absolute 10/18/2022 0.0   Lab on 10/15/2022  Component Date Value   Fecal Occult Bld 10/15/2022 Positive (A)   Office Visit on 10/08/2022  Component Date Value   H pylori Ag, Stl 10/14/2022 Negative   Office Visit on 09/27/2022  Component Date Value   WBC 09/27/2022 7.2    RBC 09/27/2022 4.21    Platelets 09/27/2022 228.0    Hemoglobin 09/27/2022 13.2    HCT 09/27/2022 37.6    MCV 09/27/2022 89.3    MCHC 09/27/2022 35.0    RDW 09/27/2022 13.1    Sodium 09/27/2022 140    Potassium 09/27/2022 3.5    Chloride 09/27/2022 102    CO2 09/27/2022 29    Glucose, Bld 09/27/2022 119 (H)    BUN 09/27/2022 13    Creatinine, Ser 09/27/2022 0.89    Total Bilirubin  09/27/2022 0.4    Alkaline Phosphatase 09/27/2022 41    AST 09/27/2022 23    ALT 09/27/2022 29    Total Protein 09/27/2022 7.0    Albumin 09/27/2022 4.2    GFR 09/27/2022 66.61    Calcium 09/27/2022 9.4    Amylase 09/27/2022 45    Lipase 09/27/2022 31.0    LACTIC ACID 09/27/2022 1.2    Color, Urine 09/27/2022 YELLOW    APPearance 09/27/2022 CLEAR    Specific Gravity, Urine 09/27/2022 1.025    pH 09/27/2022 5.5    Total Protein, Urine 09/27/2022 NEGATIVE    Urine Glucose 09/27/2022 NEGATIVE    Ketones, ur 09/27/2022 TRACE (A)    Bilirubin Urine 09/27/2022 NEGATIVE    Hgb urine dipstick 09/27/2022 SMALL (A)    Urobilinogen, UA 09/27/2022 0.2    Leukocytes,Ua 09/27/2022 NEGATIVE    Nitrite 09/27/2022 NEGATIVE    WBC, UA 09/27/2022 0-2/hpf    RBC / HPF 09/27/2022 3-6/hpf (A)    No image results found.      Additional Notes:   This encounter employed real-time, collaborative documentation. The patient actively reviewed and updated their medical record on a shared screen, ensuring transparency and facilitating joint problem-solving for the problem list, overview, and plan. This approach promotes accurate, informed care. The treatment plan was discussed and reviewed in detail, including medication safety, potential side effects, and all patient questions. We confirmed understanding and comfort with the plan. Follow-up instructions were established, including contacting the office for any concerns, returning if symptoms worsen, persist, or new symptoms develop, and precautions for potential emergency department visits. ----------------------------------------------------- Lula Olszewski, MD  08/19/2023 7:59 PM  [facility]:  [phone]

## 2023-08-19 NOTE — Assessment & Plan Note (Addendum)
She discontinued Wegovy due to side effects and is currently benefiting from Wellbutrin. She expressed interest in Zepbound. We will continue Wellbutrin and start Zepbound injection for weight loss, pending approval by the pharmacy benefits manager.

## 2023-08-19 NOTE — Patient Instructions (Addendum)
VISIT SUMMARY:  During your recent visit, we discussed several health concerns including your post-operative hand pain and weakness, weight management, aortic atherosclerosis, potential sleep apnea. We also reviewed your general health maintenance.  YOUR PLAN:  -POST-OPERATIVE HAND PAIN AND WEAKNESS: Your hand pain and weakness after surgery may be due to nerve injury with inflammation. We plan to order an EMG, a test to check the health of your nerves, continue your physical therapy, start B12 supplementation to support nerve healing, and apply Voltaren gel for inflammation.  -WEIGHT MANAGEMENT: Since you stopped Wegovy due to side effects and are currently benefiting from Wellbutrin, we plan to continue Wellbutrin and start Zepbound injection for weight loss, pending approval by the pharmacy benefits manager.  -AORTIC ATHEROSCLEROSIS: Your previous CT scan showed a mild widening of the thoracic aorta, a condition known as aortic aneurysm. We plan to order a repeat CT scan to monitor for progression and refer you to a cardiologist for further evaluation.  -GENERAL HEALTH MAINTENANCE: We will continue your current medications, including your cholesterol medication. We are considering increasing the dose of Wellbutrin for additional energy and will continue monitoring your thyroid function.  INSTRUCTIONS:   Continue taking your current medications as prescribed and start the new treatments as directed. Drink plenty of fluids to help pass the kidney stone. We will be in touch regarding the approval for the Zepbound injection and the scheduling of your repeat CT scan.

## 2023-08-19 NOTE — Assessment & Plan Note (Addendum)
Post-operative hand pain and weakness Following hand surgery in May, she exhibits pain and weakness, likely due to nerve injury with inflammation. She is on pain medication and attending physical therapy. We will order an EMG to identify potential nerve damage, continue physical therapy, start B12 supplementation to support nerve healing, and apply Voltaren gel for inflammation.  Inflammation post surgical associated with weakness/tremor since carpal tunnel syndrome suspicious for surgical complication not responding well to physical therapy Encouraged patient to take NSAIDs Encouraged patient to continue(s) with physical therapy Encouraged patient to see her hand surgeon again I will get eeg to assess for suspected never

## 2023-08-25 ENCOUNTER — Encounter: Payer: Self-pay | Admitting: Internal Medicine

## 2023-08-25 ENCOUNTER — Other Ambulatory Visit: Payer: Self-pay | Admitting: Internal Medicine

## 2023-08-25 ENCOUNTER — Ambulatory Visit
Admission: RE | Admit: 2023-08-25 | Discharge: 2023-08-25 | Disposition: A | Discharge status: Home / Self Care | Payer: Medicare Other | Source: Ambulatory Visit | Attending: Internal Medicine | Admitting: Internal Medicine

## 2023-08-25 DIAGNOSIS — M79642 Pain in left hand: Secondary | ICD-10-CM

## 2023-08-25 DIAGNOSIS — R195 Other fecal abnormalities: Secondary | ICD-10-CM

## 2023-08-25 DIAGNOSIS — R918 Other nonspecific abnormal finding of lung field: Secondary | ICD-10-CM

## 2023-08-25 DIAGNOSIS — I712 Thoracic aortic aneurysm, without rupture, unspecified: Secondary | ICD-10-CM

## 2023-08-25 DIAGNOSIS — E039 Hypothyroidism, unspecified: Secondary | ICD-10-CM

## 2023-08-25 DIAGNOSIS — G5603 Carpal tunnel syndrome, bilateral upper limbs: Secondary | ICD-10-CM

## 2023-08-25 DIAGNOSIS — I700 Atherosclerosis of aorta: Secondary | ICD-10-CM | POA: Insufficient documentation

## 2023-08-25 DIAGNOSIS — R638 Other symptoms and signs concerning food and fluid intake: Secondary | ICD-10-CM

## 2023-08-25 DIAGNOSIS — F325 Major depressive disorder, single episode, in full remission: Secondary | ICD-10-CM

## 2023-08-25 DIAGNOSIS — R110 Nausea: Secondary | ICD-10-CM

## 2023-08-25 MED ORDER — IOPAMIDOL (ISOVUE-370) INJECTION 76%
200.0000 mL | Freq: Once | INTRAVENOUS | Status: AC | PRN
  Administered 2023-08-25: 75 mL via INTRAVENOUS

## 2023-08-25 NOTE — Progress Notes (Signed)
Patient's CT angiography results for thoracic aortic aneurysm follow-up:  Key findings: Stable mildly dilated ascending thoracic aorta, measuring 4.2 cm (unchanged from March 2023) Mild coronary artery calcifications and aortic atherosclerosis noted Stable small solid pulmonary nodules, considered benign due to >1 year stability  Clinical interpretation: Results are reassuring. Aneurysm size is below the threshold for surgical intervention. Follows 2010 ACCF/AHA guidelines for thoracic aortic disease management.  Patient instructions: No medication changes at this time Continue current hypertension management Emphasize importance of annual imaging follow-up  Next steps: Schedule follow-up CT angiogram or MRA in one year Routine cardiovascular risk factor management  If patient expresses anxiety about the aneurysm, reassure them that it's stable and being appropriately monitored. Encourage them to maintain a healthy lifestyle and adhere to their current treatment plan. Your role in communicating these results is crucial. Thank you for your expertise in patient care and for ensuring our patients understand their health status.

## 2023-09-16 ENCOUNTER — Other Ambulatory Visit: Payer: Self-pay | Admitting: *Deleted

## 2023-09-16 ENCOUNTER — Telehealth: Payer: Self-pay | Admitting: Internal Medicine

## 2023-09-16 MED ORDER — TIRZEPATIDE-WEIGHT MANAGEMENT 5 MG/0.5ML ~~LOC~~ SOLN: 5.0000 mg | SUBCUTANEOUS | 2 refills | Status: DC

## 2023-09-16 NOTE — Telephone Encounter (Signed)
Patient states Pharmacy requires a new RX for Zepbound every time dosage is increased. Patient states she completed the 2.5 mg and should now be taking 5.0 mg.  Requests RX for Zepbound for dosage of 5.0 mg be sent to  [facility] [address], [place] - [address] [place] [address] Phone: [phone]  Fax: [phone]

## 2023-09-16 NOTE — Telephone Encounter (Signed)
Rx sent to the pharmacy.

## 2023-09-19 ENCOUNTER — Other Ambulatory Visit: Payer: Self-pay

## 2023-09-19 DIAGNOSIS — R638 Other symptoms and signs concerning food and fluid intake: Secondary | ICD-10-CM

## 2023-09-19 DIAGNOSIS — E669 Obesity, unspecified: Secondary | ICD-10-CM

## 2023-09-19 MED ORDER — ZEPBOUND 5 MG/0.5ML ~~LOC~~ SOAJ
5.0000 mg | SUBCUTANEOUS | 0 refills | Status: DC
Start: 2023-09-19 — End: 2023-10-17

## 2023-09-20 ENCOUNTER — Ambulatory Visit: Payer: Medicare Other | Attending: Cardiology | Admitting: Cardiology

## 2023-09-20 ENCOUNTER — Encounter: Payer: Self-pay | Admitting: Cardiology

## 2023-09-20 VITALS — BP 110/74 | HR 96 | Ht 66.5 in | Wt 171.2 lb

## 2023-09-20 DIAGNOSIS — I77810 Thoracic aortic ectasia: Secondary | ICD-10-CM | POA: Diagnosis not present

## 2023-09-20 DIAGNOSIS — I2510 Atherosclerotic heart disease of native coronary artery without angina pectoris: Secondary | ICD-10-CM | POA: Diagnosis not present

## 2023-09-20 DIAGNOSIS — I10 Essential (primary) hypertension: Secondary | ICD-10-CM | POA: Diagnosis not present

## 2023-09-20 DIAGNOSIS — I700 Atherosclerosis of aorta: Secondary | ICD-10-CM

## 2023-09-20 MED ORDER — OMEGA-3-ACID ETHYL ESTERS 1 G PO CAPS: 2.0000 g | ORAL_CAPSULE | Freq: Two times a day (BID) | ORAL | 3 refills | Status: DC

## 2023-09-20 MED ORDER — OMEGA-3-ACID ETHYL ESTERS 1 G PO CAPS: 2.0000 g | ORAL_CAPSULE | Freq: Two times a day (BID) | ORAL | 5 refills | Status: DC

## 2023-09-20 NOTE — Progress Notes (Signed)
Cardiology Office Note:  .   Date:  09/20/2023  ID:  Martha Orr, DOB [birth year], MRN [id number] PCP: Lula Olszewski, MD  [facility] Providers Cardiologist:  Donato Schultz, MD    History of Present Illness: .   Martha Orr is a 67 y.o. 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  Discussed the use of AI scribe software for clinical note transcription with the patient, who gave verbal consent to proceed.  History of Present Illness   The patient, a 67 year old individual with a history of mildly dilated thoracic aorta 42mm, coronary artery calcifications, and aortic atherosclerosis, presents for a follow-up visit. The patient was last seen over three years ago, at which time she reported difficulty walking distances, dyspnea on exertion, decreased stamina, and palpitations. The patient's heart rate would occasionally increase to the 120s. The patient's mother had a history of myocardial infarction, which was a concern for her.  In April 2021, the patient experienced chest discomfort and underwent a coronary CT scan. The scan revealed a calcium score of 31 (69th percentile) with mild calcified plaque in the proximal LAD, indicative of nonobstructive disease. The ascending aorta measured approximately 4.2 cm at that time.  The patient is currently on hydrochlorothiazide 12.5 mg daily for hypertension and Crestor 40 mg daily for hyperlipidemia. The patient also takes Zepbound 5 mg once a week. The patient reports feeling generally well, with no significant complaints.  The patient has lost about 20 pounds and denies having diabetes. The patient also takes an omega-3 supplement. The patient's triglycerides have been on the high side, despite dietary modifications. The patient has no history of smoking.  The patient's aorta has remained stable  at 4.2 cm since the last CT scan in April 2021. The patient has no chest pain or other significant symptoms.       - Denies smoking  Studies Reviewed: Marland Kitchen        LABS Creatinine: 0.8 Triglycerides: 300 (01/2022)  RADIOLOGY CT scan: Mildly dilated thoracic aorta ascending at 4.2 cm (08/25/2023) Coronary CT scan: Calcium score of 31, 69th percentile, mild calcified plaque in proximal LAD, nonobstructive disease (04/10/2020)  Risk Assessment/Calculations:            Physical Exam:   VS:  BP 110/74   Pulse 96   Ht 5' 6.5" (1.689 m)   Wt 171 lb 3.2 oz (77.7 kg)   SpO2 95%   BMI 27.22 kg/m    Wt Readings from Last 3 Encounters:  09/20/23 171 lb 3.2 oz (77.7 kg)  08/19/23 177 lb (80.3 kg)  07/14/23 179 lb 12.8 oz (81.6 kg)    GEN: Well nourished, well developed in no acute distress NECK: No JVD; No carotid bruits CARDIAC: RRR, no murmurs, rubs, gallops RESPIRATORY:  Clear to auscultation without rales, wheezing or rhonchi  ABDOMEN: Soft, non-tender, non-distended EXTREMITIES:  No edema; No deformity   ASSESSMENT AND PLAN: .    Assessment and Plan    Mildly Dilated Ascending Aorta Stable at 4.2 cm since 2021. No significant change over the past few years. Discussed the low risk of rupture at this size and the importance of blood pressure control, diet, exercise, and weight loss in management. -Continue current management and consider skipping CT scan surveillance for one year given stability.  Coronary Artery Disease Hyperlipidemia/hypertriglyceridemia Mild calcified plaque in the proximal LAD  noted on coronary CT scan in 2021. Currently on Crestor 40mg  daily for hyperlipidemia. Discussed the role of Crestor in stabilizing plaque and reducing risk of myocardial infarction. -Check lipid panel and LP(a) today to assess current status and risk. -Will switch from omega-3 supplement to Lovaza for more targeted triglyceride management.  Hypertension Currently managed with  hydrochlorothiazide 12.5mg  daily. -Continue current management.  Follow-up in one year.             Signed, Donato Schultz, MD

## 2023-09-20 NOTE — Patient Instructions (Signed)
Medication Instructions:  Your physician has recommended you make the following change in your medication:  1.) stop omega 3 2.) start Lovaza 1 gm - take TWO tablets (2 gm) twice a day  *If you need a refill on your cardiac medications before your next appointment, please call your pharmacy*   Lab Work: Today: lipids and Lp(a)  If you have labs (blood work) drawn today and your tests are completely normal, you will receive your results only by: MyChart Message (if you have MyChart) OR A paper copy in the mail If you have any lab test that is abnormal or we need to change your treatment, we will call you to review the results.   Testing/Procedures: none   Follow-Up: At [facility], you and your health needs are our priority.  As part of our continuing mission to provide you with exceptional heart care, we have created designated Provider Care Teams.  These Care Teams include your primary Cardiologist (physician) and Advanced Practice Providers (APPs -  Physician Assistants and Nurse Practitioners) who all work together to provide you with the care you need, when you need it.   Your next appointment:   12 month(s)  Provider:   Donato Schultz, MD

## 2023-09-21 ENCOUNTER — Telehealth: Payer: Self-pay | Admitting: Pharmacy Technician

## 2023-09-21 ENCOUNTER — Other Ambulatory Visit (HOSPITAL_COMMUNITY): Payer: Self-pay

## 2023-09-21 LAB — LIPID PANEL
Chol/HDL Ratio: 2.4 ratio (ref 0.0–4.4)
Cholesterol, Total: 147 mg/dL (ref 100–199)
HDL: 61 mg/dL (ref >39)
LDL Chol Calc (NIH): 55 mg/dL (ref 0–99)
Triglycerides: 194 mg/dL — ABNORMAL HIGH (ref 0–149)
VLDL Cholesterol Cal: 31 mg/dL (ref 5–40)

## 2023-09-21 LAB — LIPOPROTEIN A (LPA): Lipoprotein (a): <8.4 nmol/L (ref <75.0)

## 2023-09-21 NOTE — Telephone Encounter (Signed)
Pharmacy Patient Advocate Encounter   Received notification from CoverMyMeds that prior authorization for lovaza is required/requested.   Insurance verification completed.   The patient is insured through [employer] [facility] [employer] .   Per test claim: PA required; PA submitted to [facility] via CoverMyMeds Key/confirmation #/[id number] Status is pending

## 2023-09-21 NOTE — Telephone Encounter (Signed)
Pharmacy Patient Advocate Encounter  Received notification from [facility] that Prior Authorization for lovaza has been APPROVED from 09/21/23 to 09/20/24. Ran test claim, Copay is $10.00. This test claim was processed through [facility]- copay amounts may vary at other pharmacies due to pharmacy/plan contracts, or as the patient moves through the different stages of their insurance plan.   PA #/Case ID/Reference #: [id number]

## 2023-10-15 ENCOUNTER — Other Ambulatory Visit: Payer: Self-pay | Admitting: Internal Medicine

## 2023-10-15 DIAGNOSIS — R638 Other symptoms and signs concerning food and fluid intake: Secondary | ICD-10-CM

## 2023-10-15 DIAGNOSIS — E669 Obesity, unspecified: Secondary | ICD-10-CM

## 2023-11-17 ENCOUNTER — Other Ambulatory Visit: Payer: Self-pay | Admitting: Internal Medicine

## 2023-11-17 DIAGNOSIS — R638 Other symptoms and signs concerning food and fluid intake: Secondary | ICD-10-CM

## 2023-11-17 DIAGNOSIS — E669 Obesity, unspecified: Secondary | ICD-10-CM

## 2023-11-22 ENCOUNTER — Encounter: Payer: Self-pay | Admitting: Internal Medicine

## 2023-11-22 ENCOUNTER — Ambulatory Visit: Payer: Medicare Other | Admitting: Internal Medicine

## 2023-11-22 VITALS — BP 113/76 | HR 75 | Temp 98.1°F | Ht 66.5 in | Wt 167.2 lb

## 2023-11-22 DIAGNOSIS — M1990 Unspecified osteoarthritis, unspecified site: Secondary | ICD-10-CM

## 2023-11-22 DIAGNOSIS — R319 Hematuria, unspecified: Secondary | ICD-10-CM | POA: Diagnosis not present

## 2023-11-22 DIAGNOSIS — E669 Obesity, unspecified: Secondary | ICD-10-CM | POA: Diagnosis not present

## 2023-11-22 DIAGNOSIS — R638 Other symptoms and signs concerning food and fluid intake: Secondary | ICD-10-CM

## 2023-11-22 DIAGNOSIS — I700 Atherosclerosis of aorta: Secondary | ICD-10-CM | POA: Diagnosis not present

## 2023-11-22 DIAGNOSIS — J309 Allergic rhinitis, unspecified: Secondary | ICD-10-CM

## 2023-11-22 DIAGNOSIS — E785 Hyperlipidemia, unspecified: Secondary | ICD-10-CM

## 2023-11-22 DIAGNOSIS — R918 Other nonspecific abnormal finding of lung field: Secondary | ICD-10-CM

## 2023-11-22 LAB — CBC WITH DIFFERENTIAL/PLATELET
Basophils Absolute: 0 10*3/uL (ref 0.0–0.1)
Basophils Relative: 0.6 % (ref 0.0–3.0)
Eosinophils Absolute: 0.3 10*3/uL (ref 0.0–0.7)
Eosinophils Relative: 4.7 % (ref 0.0–5.0)
HCT: 38.3 % (ref 36.0–46.0)
Hemoglobin: 13.2 g/dL (ref 12.0–15.0)
Lymphocytes Relative: 31.5 % (ref 12.0–46.0)
Lymphs Abs: 1.9 10*3/uL (ref 0.7–4.0)
MCHC: 34.4 g/dL (ref 30.0–36.0)
MCV: 88.4 fL (ref 78.0–100.0)
Monocytes Absolute: 0.6 10*3/uL (ref 0.1–1.0)
Monocytes Relative: 9.5 % (ref 3.0–12.0)
Neutro Abs: 3.2 10*3/uL (ref 1.4–7.7)
Neutrophils Relative %: 53.7 % (ref 43.0–77.0)
Platelets: 268 10*3/uL (ref 150.0–400.0)
RBC: 4.33 Mil/uL (ref 3.87–5.11)
RDW: 13.5 % (ref 11.5–15.5)
WBC: 6 10*3/uL (ref 4.0–10.5)

## 2023-11-22 LAB — COMPREHENSIVE METABOLIC PANEL
ALT: 20 U/L (ref 0–35)
AST: 20 U/L (ref 0–37)
Albumin: 4.3 g/dL (ref 3.5–5.2)
Alkaline Phosphatase: 34 U/L — ABNORMAL LOW (ref 39–117)
BUN: 15 mg/dL (ref 6–23)
CO2: 32 meq/L (ref 19–32)
Calcium: 9.4 mg/dL (ref 8.4–10.5)
Chloride: 100 meq/L (ref 96–112)
Creatinine, Ser: 0.75 mg/dL (ref 0.40–1.20)
GFR: 81.14 mL/min (ref >60.00)
Glucose, Bld: 90 mg/dL (ref 70–99)
Potassium: 3.3 meq/L — ABNORMAL LOW (ref 3.5–5.1)
Sodium: 140 meq/L (ref 135–145)
Total Bilirubin: 0.5 mg/dL (ref 0.2–1.2)
Total Protein: 7 g/dL (ref 6.0–8.3)

## 2023-11-22 LAB — URINALYSIS, ROUTINE W REFLEX MICROSCOPIC
Bilirubin Urine: NEGATIVE
Ketones, ur: NEGATIVE
Nitrite: NEGATIVE
Specific Gravity, Urine: >=1.03 — AB (ref 1.000–1.030)
Total Protein, Urine: NEGATIVE
Urine Glucose: NEGATIVE
Urobilinogen, UA: 0.2 (ref 0.0–1.0)
pH: 6 (ref 5.0–8.0)

## 2023-11-22 LAB — LIPID PANEL
Cholesterol: 133 mg/dL (ref 0–200)
HDL: 52.9 mg/dL (ref >39.00)
LDL Cholesterol: 45 mg/dL (ref 0–99)
NonHDL: 80.12
Total CHOL/HDL Ratio: 3
Triglycerides: 174 mg/dL — ABNORMAL HIGH (ref 0.0–149.0)
VLDL: 34.8 mg/dL (ref 0.0–40.0)

## 2023-11-22 MED ORDER — ZEPBOUND 5 MG/0.5ML ~~LOC~~ SOAJ
5.0000 mg | SUBCUTANEOUS | 11 refills | Status: DC
Start: 2023-11-22 — End: 2024-03-20

## 2023-11-22 MED ORDER — LEVOCETIRIZINE DIHYDROCHLORIDE 5 MG PO TABS: 5.0000 mg | ORAL_TABLET | Freq: Every evening | ORAL | 1 refills | Status: DC

## 2023-11-22 NOTE — Patient Instructions (Signed)
After Visit Summary  Today's Visit Overview We discussed several ongoing health conditions including arthritis, blood in urine, cholesterol management, and sinus problems. Below are important instructions from today's visit:  Laboratory Tests - Please complete ordered blood work today while fasting - Provide urine sample for testing - Results will be reviewed at follow-up visit  Medication Changes and Instructions For Allergies/Sinus: 1. Start using saline nasal rinse every evening 2. After rinsing, use Flonase nasal spray 3. Start taking Xyzal daily for allergies 4. Continue other medications as prescribed  For Cholesterol: 1. Increase omega-3 (fish oil) supplements as discussed 2. Continue other cholesterol medications as prescribed  Dietary Recommendations - Avoid foods fried in lard (such as fast food french fries) - Choose foods prepared with healthier oils (like peanut oil) when eating out - Continue heart-healthy diet choices  Follow-up Care 1. Schedule follow-up appointment to review lab results 2. CT scan follow-up scheduled for August 2025 3. Continue care with hand specialist Dr. Janee Morn 4. New referral to urologist will be arranged  Important Warning Signs Contact office if you experience: - Severe joint pain or swelling - New or worsening symptoms - Side effects from any medications  Prescription Information Your Zepbound prescription will be set up for automatic refills at [facility] in [place]  Next Appointment Follow-up scheduled for lab result review  Questions or Concerns? Please contact our office during business hours for any questions or concerns about your treatment plan.

## 2023-11-22 NOTE — Assessment & Plan Note (Signed)
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 Plan:  Schedule repeat CT angiogram for August 2025 Continue current surveillance protocol  Medication Management Plan:  Coordinate with [facility] in [place] for automatic Zetban refills Maintain current 5mg  dosing as per patient preference Review and optimize current medication regimen

## 2023-11-22 NOTE — Assessment & Plan Note (Signed)
Hyperlipidemia with Atherosclerosis (I70.0) Elevated triglycerides (194) with known atherosclerosis requiring aggressive management. Currently suboptimally controlled on current regimen. Plan:  Increase omega-3 supplementation from current once daily dosing Order fasting lipid panel Consider additional cholesterol-lowering medication based on results Counsel on dietary modifications, specifically avoiding saturated fat

## 2023-11-22 NOTE — Assessment & Plan Note (Signed)
Chronic Microscopic Hematuria (R31.9) Longstanding microscopic hematuria with family history requires urologic evaluation to rule out underlying pathology. Plan:  Order urinalysis and urine cytology Refer to urology for comprehensive evaluation Educate on importance of evaluation despite chronic nature and family history

## 2023-11-22 NOTE — Progress Notes (Signed)
[facility]: [phone]   -- Medical Office Visit --  Patient:  Martha Orr      Age: 67 y.o.       Sex:  female  Date:   11/22/2023 Today's Healthcare Provider: Lula Olszewski, MD  ==========================================================================      Assessment & Plan Hematuria, unspecified type Chronic Microscopic Hematuria (R31.9) Longstanding microscopic hematuria with family history requires urologic evaluation to rule out underlying pathology. Plan:  Order urinalysis and urine cytology Refer to urology for comprehensive evaluation Educate on importance of evaluation despite chronic nature and family history Allergic sinusitis Chronic Allergic Sinusitis (J31.0) Persistent nasal drainage with positive response to antihistamines suggesting allergic etiology. Associated chronic cough likely post-nasal drip related. Plan:  Start Xyzal daily Continue Flonase nasal spray Add saline nasal rinse before Flonase administration Monitor for improvement in symptoms Return if symptoms persist or worsen Obesity (BMI 30-39.9)  Weight disorder  Atherosclerosis of aorta (HCC) Hyperlipidemia with Atherosclerosis (I70.0) Elevated triglycerides (194) with known atherosclerosis requiring aggressive management. Currently suboptimally controlled on current regimen. Plan:  Increase omega-3 supplementation from current once daily dosing Order fasting lipid panel Consider additional cholesterol-lowering medication based on results Counsel on dietary modifications, specifically avoiding saturated fat Arthritis Primary Osteoarthritis (M19.91) Chronic osteoarthritis affecting multiple joints including knees and hands. Clinical presentation strongly suggests osteoarthritis rather than inflammatory arthritis. Based on physical exam and distribution of joint involvement, rheumatoid arthritis is unlikely but will be definitively evaluated. Plan:Order CCP  antibody and rheumatoid factor for definitive diagnosis Continue care with hand specialist Dr. Janee Morn Maintain current pain management regimen including acetaminophen and topical diclofenac as needed Hyperlipidemia, unspecified hyperlipidemia type Hyperlipidemia with Atherosclerosis (I70.0) Elevated triglycerides (194) with known atherosclerosis requiring aggressive management. Currently suboptimally controlled on current regimen. Plan:  Increase omega-3 supplementation from current once daily dosing Order fasting lipid panel Consider additional cholesterol-lowering medication based on results Counsel on dietary modifications, specifically avoiding saturated fat Pulmonary nodules Stable Pulmonary Nodules and Thoracic Aortic Aneurysm (R91.1, I71.2) Recent CT angiogram from August 2024 shows stability of both pulmonary nodules and aortic aneurysm. Plan:  Schedule repeat CT angiogram for August 2025 Continue current surveillance protocol  Medication Management Plan:  Coordinate with [facility] in [place] for automatic Zetban refills Maintain current 5mg  dosing as per patient preference Review and optimize current medication regimen     Orders Placed During this Encounter:   ED Discharge Orders     None     Follow-up:  Schedule follow-up visit after completion of ordered labs Sooner for any new or worsening symptoms Continue yearly imaging surveillance Patient Care Team: Lula Olszewski, MD as PCP - General (Internal Medicine) Jake Bathe, MD as PCP - Cardiology (Cardiology) Jeannett Senior, MD as Consulting Physician (Obstetrics and Gynecology) Exie Parody, MD as Consulting Physician (Endocrinology) Marcelino Duster, MD as Referring Physician (Dermatology) Marcene Corning, MD as Consulting Physician (Orthopedic Surgery) Johnnette Gourd, DDS as Consulting Physician (Dentistry) Raymond Gurney, PT as Consulting Physician (Physical Therapy) Claria Dice, MD as  Attending Physician (Physical Medicine and Rehabilitation) Unk Lightning, [place] as Physician Assistant (Gastroenterology) Lucretia Roers, MD as Consulting Physician (General Surgery) Huston Foley, MD as Attending Physician (Sleep Medicine)    SUBJECTIVE: 67 y.o. female who has Bilateral primary osteoarthritis of knee; Major depressive disorder, single episode, in remission (HCC); Anxiety; Dyslipidemia; Hypothyroidism; H/O cold sores; Osteoarthritis; Restless legs; Pulmonary nodules; Otalgia of both ears; Hypertension; Hyperglycemia; Hematuria; Thoracic aortic aneurysm (HCC); Allergic rhinitis; Nausea; Hepatic steatosis; Cough,  persistent; Gastroesophageal reflux disease without esophagitis; Globus pharyngeus; Lumbar arthropathy; Occult blood in stools; Hiatal hernia; History of colon polyps; Hemorrhoid; Obesity (BMI 30-39.9); Snoring; Hand pain, left; and Atherosclerosis of aorta (HCC) on their problem list.  Main reasons for visit/main concerns/chief complaint: Depression (Pt here for f/u with any concerns. ), Anxiety, Hypertension, and Osteoarthritis    AI-Extracted: Discussed the use of AI scribe software for clinical note transcription with the patient, who gave verbal consent to proceed.     History of Present Illness  66 year old female presenting for routine follow-up of multiple chronic conditions. Primary concerns today include arthritis management, chronic hematuria evaluation, and ongoing sinus symptoms.  The patient reports chronic arthritis affecting multiple joints, most notably the knees and hands, diagnosed as osteoarthritis over four years ago. She expresses concern about the specific type of arthritis and requests evaluation to differentiate between osteoarthritis and rheumatoid arthritis. She is currently under the care of hand specialist Dr. Janee Morn.  Patient reports longstanding microscopic hematuria which she notes runs in her family, affecting her sisters and  nieces. She has not previously had urologic evaluation for this condition. She denies any associated symptoms or changes in the pattern of hematuria.  Chronic sinus symptoms persist with continuous nasal drainage. She has tried Careers adviser with partial improvement, suggesting an allergic component. Associated symptoms include a persistent cough. Prior medical history includes nasal surgery, with known anatomical variation showing more bone on one side. Previous ENT evaluation noted.  Patient has known atherosclerosis requiring aggressive cholesterol management. Recent labs showed elevated triglycerides at 194 despite being on omega-3 supplementation at one capsule daily, which she acknowledges is likely suboptimal dosing. She reports compliance with fasting for today's lab work.  Surveillance of known pulmonary nodules and thoracic aortic aneurysm shows stability on most recent CT angiogram from August 2024. Patient is aware of need for ongoing monitoring.  Medication management discussion reveals challenges with monthly Zetban prescription refills, requesting transition to automatic refills if possible. Current dose is 5mg  and patient prefers not to increase.      Note that patient  has a past medical history of Abdominal pain, chronic, right lower quadrant (09/27/2022), Anxiety, Arthritis, Chronic back pain, Depression, Family history of adverse reaction to anesthesia, Gallstones, Hepatic steatosis (10/04/2022), High cholesterol, History of appendectomy (10/04/2022), History of cholecystectomy (10/04/2022), History of colon polyps, History of hysterectomy (10/04/2022), History of shingles, Hypertension, Hypothyroidism, Joint pain, Joint swelling, Microscopic hematuria, and Nausea (09/27/2022).  Problem list overviews that were updated at today's visit:No problems updated.  Med reconciliation: Current Outpatient Medications on File Prior to Visit  Medication Sig   acetaminophen (TYLENOL) 325 MG tablet  Take 325 mg by mouth every 6 (six) hours as needed for moderate pain.   Biotin w/ Vitamins C & E (HAIR/SKIN/NAILS PO) Take 1 capsule by mouth daily.   buPROPion (WELLBUTRIN XL) 300 MG 24 hr tablet Take 1 tablet (300 mg total) by mouth in the morning. (Patient taking differently: Take 150 mg by mouth daily.)   cholecalciferol (VITAMIN D) 1000 units tablet Take 1,000 Units by mouth at bedtime.   diclofenac Sodium (VOLTAREN) 1 % GEL Apply 4 g topically 4 (four) times daily as needed. (Patient not taking: Reported on 09/20/2023)   escitalopram (LEXAPRO) 20 MG tablet TAKE 1 TABLET DAILY   fluticasone (FLONASE) 50 MCG/ACT nasal spray Place 2 sprays into both nostrils daily as needed for allergies.   Ginkgo Biloba Extract 60 MG CAPS Take 1 capsule by mouth daily.   hydrochlorothiazide (  MICROZIDE) 12.5 MG capsule TAKE 1 CAPSULE DAILY   omega-3 acid ethyl esters (LOVAZA) 1 g capsule Take 2 capsules (2 g total) by mouth 2 (two) times daily.   omeprazole (PRILOSEC) 40 MG capsule TAKE 1 CAPSULE DAILY AS    NEEDED (Patient taking differently: Take 40 mg by mouth daily.)   rosuvastatin (CRESTOR) 40 MG tablet TAKE 0.5 TABLETS (20 MG TOTAL) BY MOUTH AT BEDTIME. REPLACES 20 MG DOSE (Patient taking differently: Take 40 mg by mouth at bedtime.)   SYNTHROID 112 MCG tablet TAKE 1 TABLET AT BEDTIME   topiramate (TOPAMAX) 25 MG tablet Take 1 tablet (25 mg total) by mouth 2 (two) times daily. To start: just 1 tablet daily for a week (Patient not taking: Reported on 09/20/2023)   ZEPBOUND 5 MG/0.5ML Pen INJECT 5 MG SUBCUTANEOUSLY ONCE A WEEK   Current Facility-Administered Medications on File Prior to Visit  Medication   acidophilus (RISAQUAD) capsule 1 capsule  There are no discontinued medications.   Objective   Physical Exam     11/22/2023    8:11 AM 09/20/2023    1:02 PM 08/19/2023    8:09 AM  Vitals with BMI  Height 5' 6.5" 5' 6.5" 5\' 6"   Weight  171 lbs 3 oz 177 lbs  BMI  27.22 28.58  Systolic  110 108   Diastolic  74 70  Pulse  96 68   Wt Readings from Last 10 Encounters:  09/20/23 171 lb 3.2 oz (77.7 kg)  08/19/23 177 lb (80.3 kg)  07/14/23 179 lb 12.8 oz (81.6 kg)  05/30/23 189 lb (85.7 kg)  04/28/23 186 lb (84.4 kg)  04/15/23 186 lb 15.2 oz (84.8 kg)  04/14/23 186 lb 15.2 oz (84.8 kg)  03/17/23 187 lb (84.8 kg)  02/11/23 187 lb 6.4 oz (85 kg)  01/28/23 179 lb (81.2 kg)   Vital signs reviewed.  Nursing notes reviewed. Weight trend reviewed.  General Appearance:  No acute distress appreciable.   Well-groomed, healthy-appearing female.  Well proportioned with no abnormal fat distribution.  Good muscle tone. Pulmonary:  Normal work of breathing at rest, no respiratory distress apparent.    Musculoskeletal: All extremities are intact.  Neurological:  Awake, alert, oriented, and engaged.  No obvious focal neurological deficits or cognitive impairments.  Sensorium seems unclouded.   Speech is clear and coherent with logical content. Psychiatric:  Appropriate mood, pleasant and cooperative demeanor, thoughtful and engaged during the exam  Results            No results found for any visits on 11/22/23.  Office Visit on 09/20/2023  Component Date Value   Cholesterol, Total 09/20/2023 147    Triglycerides 09/20/2023 194 (H)    HDL 09/20/2023 61    VLDL Cholesterol Cal 09/20/2023 31    LDL Chol Calc (NIH) 09/20/2023 55    Chol/HDL Ratio 09/20/2023 2.4    Lipoprotein (a) 09/20/2023 <8.4   Office Visit on 05/30/2023  Component Date Value   Sodium 05/30/2023 141    Potassium 05/30/2023 3.2 (L)    Chloride 05/30/2023 98    CO2 05/30/2023 34 (H)    Glucose, Bld 05/30/2023 119 (H)    BUN 05/30/2023 13    Creatinine, Ser 05/30/2023 0.80    Total Bilirubin 05/30/2023 0.3    Alkaline Phosphatase 05/30/2023 44    AST 05/30/2023 22    ALT 05/30/2023 24    Total Protein 05/30/2023 7.1    Albumin 05/30/2023 4.3    GFR 05/30/2023  75.35    Calcium 05/30/2023 9.7    WBC 05/30/2023  5.9    RBC 05/30/2023 4.26    Hemoglobin 05/30/2023 12.9    HCT 05/30/2023 38.1    MCV 05/30/2023 89.3    MCHC 05/30/2023 34.0    RDW 05/30/2023 12.9    Platelets 05/30/2023 245.0    Neutrophils Relative % 05/30/2023 41.7 (L)    Lymphocytes Relative 05/30/2023 40.8    Monocytes Relative 05/30/2023 11.6    Eosinophils Relative 05/30/2023 5.2 (H)    Basophils Relative 05/30/2023 0.7    Neutro Abs 05/30/2023 2.5    Lymphs Abs 05/30/2023 2.4    Monocytes Absolute 05/30/2023 0.7    Eosinophils Absolute 05/30/2023 0.3    Basophils Absolute 05/30/2023 0.0    TSH 05/30/2023 0.965   Hospital Outpatient Visit on 04/14/2023  Component Date Value   Sodium 04/14/2023 135    Potassium 04/14/2023 3.1 (L)    Chloride 04/14/2023 100    CO2 04/14/2023 26    Glucose, Bld 04/14/2023 177 (H)    BUN 04/14/2023 14    Creatinine, Ser 04/14/2023 0.82    Calcium 04/14/2023 9.1    GFR, Estimated 04/14/2023 >60    Anion gap 04/14/2023 9    No image results found.   CT ANGIO CHEST AORTA W/CM & OR WO/CM  Result Date: 08/25/2023 CLINICAL DATA:  Follow-up ascending thoracic aortic aneurysm EXAM: CT ANGIOGRAPHY CHEST WITH CONTRAST TECHNIQUE: Multidetector CT imaging of the chest was performed using the standard protocol during bolus administration of intravenous contrast. Multiplanar CT image reconstructions and MIPs were obtained to evaluate the vascular anatomy. RADIATION DOSE REDUCTION: This exam was performed according to the departmental dose-optimization program which includes automated exposure control, adjustment of the mA and/or kV according to patient size and/or use of iterative reconstruction technique. CONTRAST:  75mL ISOVUE-370 IOPAMIDOL (ISOVUE-370) INJECTION 76% COMPARISON:  Chest CTA dated February 26, 2022 FINDINGS: Cardiovascular: Mildly dilated ascending thoracic aorta measuring 4.2 x 4.2 cm. Mild atherosclerotic disease of the thoracic aorta. Mild coronary artery calcifications.  Mediastinum/Nodes: Esophagus and thyroid are unremarkable. No pathologically enlarged lymph nodes seen in the chest. Lungs/Pleura: Central airways are patent. No consolidation, pleural effusion or pneumothorax. Stable small solid pulmonary nodules. Right middle lobe nodule measuring 4 mm on series 6, image 83, unchanged. Upper Abdomen: Prior cholecystectomy.  No acute abnormality. Musculoskeletal: No chest wall abnormality. No acute or significant osseous findings. Review of the MIP images confirms the above findings. IMPRESSION: 1. Stable mildly dilated ascending thoracic aorta measuring 4.2 cm. Recommend annual imaging followup by CTA or MRA. This recommendation follows 2010 ACCF/AHA/AATS/ACR/ASA/SCA/SCAI/SIR/STS/SVM Guidelines for the Diagnosis and Management of Patients with Thoracic Aortic Disease. Circulation. 2010; 121: W098-J191. Aortic aneurysm NOS (ICD10-I71.9) 2. Stable small solid pulmonary nodules, considered benign given greater than 1 year stability. 3. Mild coronary artery calcifications and aortic Atherosclerosis (ICD10-I70.0). Electronically Signed   By: Allegra Lai M.D.   On: 08/25/2023 15:57    CT ANGIO CHEST AORTA W/CM & OR WO/CM  Result Date: 08/25/2023 CLINICAL DATA:  Follow-up ascending thoracic aortic aneurysm EXAM: CT ANGIOGRAPHY CHEST WITH CONTRAST TECHNIQUE: Multidetector CT imaging of the chest was performed using the standard protocol during bolus administration of intravenous contrast. Multiplanar CT image reconstructions and MIPs were obtained to evaluate the vascular anatomy. RADIATION DOSE REDUCTION: This exam was performed according to the departmental dose-optimization program which includes automated exposure control, adjustment of the mA and/or kV according to patient size and/or use of iterative reconstruction technique.  CONTRAST:  75mL ISOVUE-370 IOPAMIDOL (ISOVUE-370) INJECTION 76% COMPARISON:  Chest CTA dated February 26, 2022 FINDINGS: Cardiovascular: Mildly dilated  ascending thoracic aorta measuring 4.2 x 4.2 cm. Mild atherosclerotic disease of the thoracic aorta. Mild coronary artery calcifications. Mediastinum/Nodes: Esophagus and thyroid are unremarkable. No pathologically enlarged lymph nodes seen in the chest. Lungs/Pleura: Central airways are patent. No consolidation, pleural effusion or pneumothorax. Stable small solid pulmonary nodules. Right middle lobe nodule measuring 4 mm on series 6, image 83, unchanged. Upper Abdomen: Prior cholecystectomy.  No acute abnormality. Musculoskeletal: No chest wall abnormality. No acute or significant osseous findings. Review of the MIP images confirms the above findings. IMPRESSION: 1. Stable mildly dilated ascending thoracic aorta measuring 4.2 cm. Recommend annual imaging followup by CTA or MRA. This recommendation follows 2010 ACCF/AHA/AATS/ACR/ASA/SCA/SCAI/SIR/STS/SVM Guidelines for the Diagnosis and Management of Patients with Thoracic Aortic Disease. Circulation. 2010; 121: Y782-N562. Aortic aneurysm NOS (ICD10-I71.9) 2. Stable small solid pulmonary nodules, considered benign given greater than 1 year stability. 3. Mild coronary artery calcifications and aortic Atherosclerosis (ICD10-I70.0). Electronically Signed   By: Allegra Lai M.D.   On: 08/25/2023 15:57         Additional Info: This encounter employed real-time, collaborative documentation. The patient actively reviewed and updated their medical record on a shared screen, ensuring transparency and facilitating joint problem-solving for the problem list, overview, and plan. This approach promotes accurate, informed care. The treatment plan was discussed and reviewed in detail, including medication safety, potential side effects, and all patient questions. We confirmed understanding and comfort with the plan. Follow-up instructions were established, including contacting the office for any concerns, returning if symptoms worsen, persist, or new symptoms develop,  and precautions for potential emergency department visits.

## 2023-11-25 LAB — CYCLIC CITRUL PEPTIDE ANTIBODY, IGG: Cyclic Citrullin Peptide Ab: <16 U

## 2023-11-25 LAB — RHEUMATOID FACTOR: Rheumatoid fact SerPl-aCnc: 11 [IU]/mL (ref <14)

## 2023-11-27 ENCOUNTER — Other Ambulatory Visit: Payer: Self-pay | Admitting: Internal Medicine

## 2023-11-27 DIAGNOSIS — R109 Unspecified abdominal pain: Secondary | ICD-10-CM

## 2023-12-29 ENCOUNTER — Other Ambulatory Visit: Payer: Self-pay | Admitting: Internal Medicine

## 2023-12-29 DIAGNOSIS — R638 Other symptoms and signs concerning food and fluid intake: Secondary | ICD-10-CM

## 2023-12-29 DIAGNOSIS — E669 Obesity, unspecified: Secondary | ICD-10-CM

## 2023-12-29 NOTE — Telephone Encounter (Signed)
 Duplicate request. Patient has plenty of refills for this medication. Left vm for patient to call the office back to let us know if she

## 2023-12-29 NOTE — Telephone Encounter (Signed)
 Copied from CRM [id number]. Topic: Clinical - Prescription Issue >> Dec 29, 2023 12:12 PM Mercedes MATSU wrote: Reason for CRM: Patient called in to inquire if the clinical team did a prior authorization on her medication Zepound (injectable pen). Is requesting a call back with that information.   Completed the prior authorization for Zepbound  5 mg. Called patient to inform her but we could not hear one another. Sent my chart message informing patient.

## 2023-12-29 NOTE — Telephone Encounter (Signed)
 Copied from CRM [id number]. Topic: Clinical - Medication Refill >> Dec 29, 2023 12:10 PM Mercedes MATSU wrote: Most Recent Primary Care Visit:  Provider: MORRISON, RYAN G  Department: [facility]  Visit Type: OFFICE VISIT  Date: 11/22/2023  Medication: tirzepatide  (ZEPBOUND ) 5 MG/0.5ML Pen  Has the patient contacted their pharmacy? Yes (Agent: If no, request that the patient contact the pharmacy for the refill. If patient does not wish to contact the pharmacy document the reason why and proceed with request.) (Agent: If yes, when and what did the pharmacy advise?)  Is this the correct pharmacy for this prescription? Yes If no, delete pharmacy and type the correct one.  This is the patient's preferred pharmacy:    [facility] [address], [place] - [address] [place] [address] [place] [address] Phone: [phone] Fax: [phone]   Has the prescription been filled recently? Yes  Is the patient out of the medication? Yes  Has the patient been seen for an appointment in the last year OR does the patient have an upcoming appointment? Yes  Can we respond through MyChart? Yes  Agent: Please be advised that Rx refills may take up to 3 business days. We ask that you follow-up with your pharmacy.

## 2023-12-29 NOTE — Telephone Encounter (Signed)
 Wants to have the medication sent to this pharmacy instead.

## 2023-12-29 NOTE — Telephone Encounter (Signed)
 Copied from CRM [id number]. Topic: Clinical - Prescription Issue >> Dec 29, 2023 12:12 PM Martha Orr wrote: Reason for CRM: Patient called in to inquire if the clinical team did a prior authorization on her medication Zepound (injectable pen). Is requesting a call back with that information.

## 2024-01-03 NOTE — Telephone Encounter (Signed)
 Copied from CRM [id number]. Topic: Clinical - Medication Question >> Jan 03, 2024 12:17 PM Martha Orr wrote: Reason for CRM: Patient called in requesting to speak with Nurse Christyl Osentoski regarding medication Zepbound  5 mg / would like to be reached at [phone]   Called patient back and informed her that I would attempt to appeal the denial of the Zepbound  5 mg. I called the number on the denial letter [phone]) and was provided with another number [phone]). Then I was provided with third phone number (was a non-working [facility] number). Was unable to reach anybody at either number. Will try again at a later time.

## 2024-01-03 NOTE — Telephone Encounter (Signed)
 Copied from CRM [id number]. Topic: Clinical - Medication Question >> Jan 03, 2024 12:17 PM Martha Orr wrote: Reason for CRM: Patient called in requesting to speak with Nurse Deunte Bledsoe regarding medication Zepbound 5 mg / would like to be reached at [phone]

## 2024-01-06 NOTE — Telephone Encounter (Signed)
 Patient has called requesting an update in regard to appeal.  Patient stated she received a letter stating her insurance will not cover medication for weight loss.  I have advised patient to reach out to her insurance company to inquire about coverage and to inform us  if there is a medication they will cover.  I have also advised patient that she can also reach out to her insurance company to start an appeal if one has not been started.

## 2024-01-09 NOTE — Telephone Encounter (Signed)
 Called patient back.

## 2024-01-09 NOTE — Telephone Encounter (Signed)
 I will attempt to call about this appeal tomorrow.

## 2024-01-25 ENCOUNTER — Telehealth: Payer: Self-pay

## 2024-01-25 ENCOUNTER — Other Ambulatory Visit: Payer: Self-pay

## 2024-01-25 NOTE — Telephone Encounter (Signed)
Spoke with patient's insurance, completed appeal for Zepbound 5 mg over the phone. Case # [id number]. Donzetta Starch, CMA

## 2024-02-05 IMAGING — CT CT ANGIO CHEST
3 of 8 series · 18 of 46 positions shown · IV contrast (OMNIPAQUE 350)
Comparison: Coronary CTA on 04/10/2020

CLINICAL DATA: Dilated ascending thoracic aorta of by prior
coronary CTA.

EXAM:
CT ANGIOGRAPHY CHEST WITH CONTRAST
TECHNIQUE: Multidetector CT imaging of the chest was performed using the
standard protocol during bolus administration of intravenous
contrast. Multiplanar CT image reconstructions and MIPs were
obtained to evaluate the vascular anatomy.

[Series 4: aorta 3.0 bf37 2 · axial · 0.74mm/px · z∈[-312,-42]mm · 13 of 106 slices shown]
[im 8/106  lung]
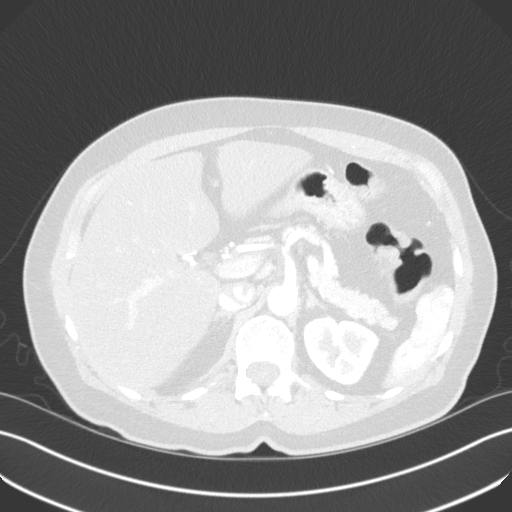
[im 16/106  soft-tissue]
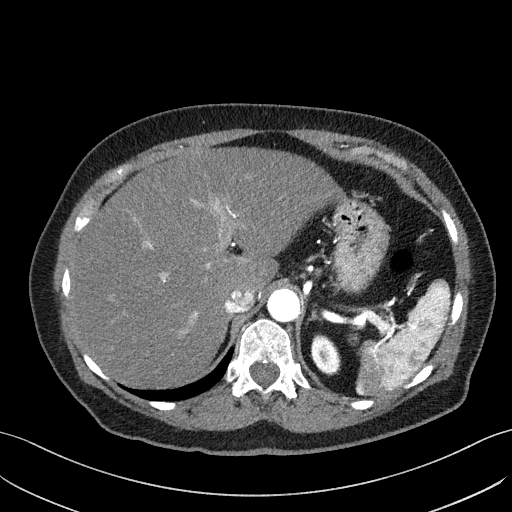
[im 23/106  lung]
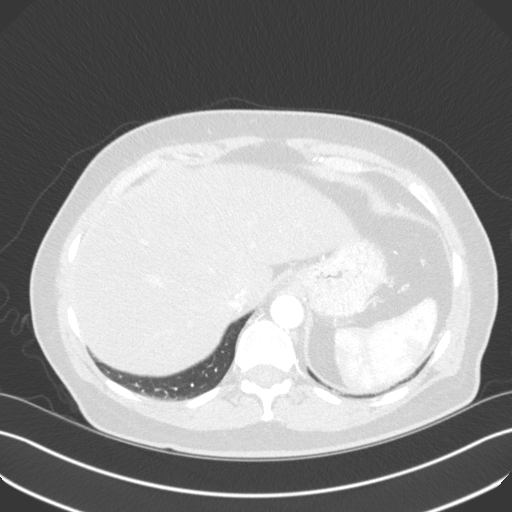
[im 31/106  soft-tissue]
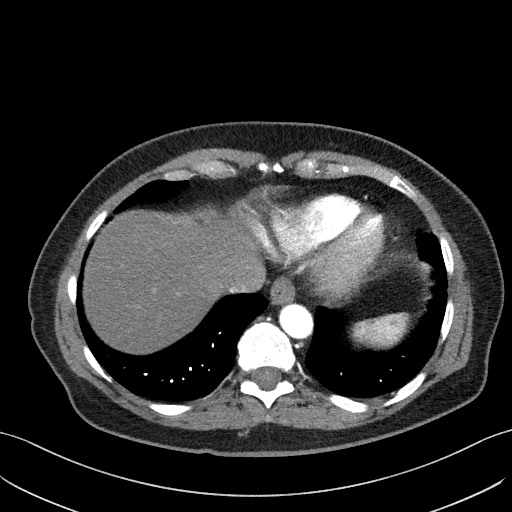
[im 38/106  lung]
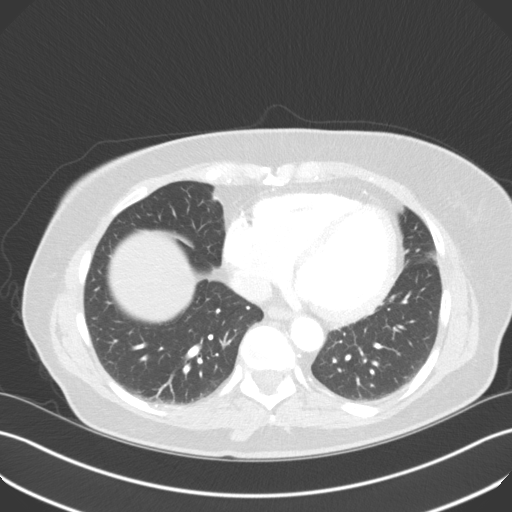
[im 46/106  soft-tissue]
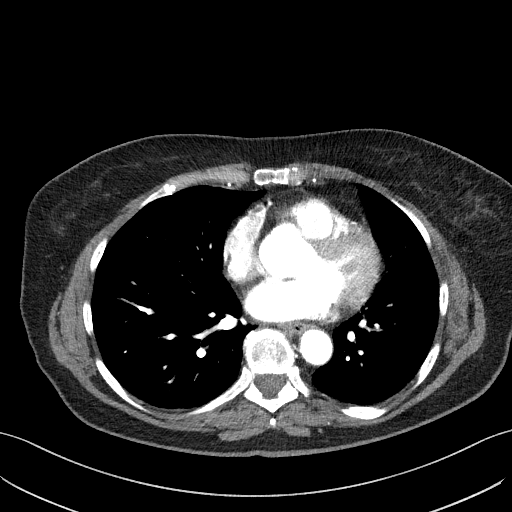
[im 53/106  lung]
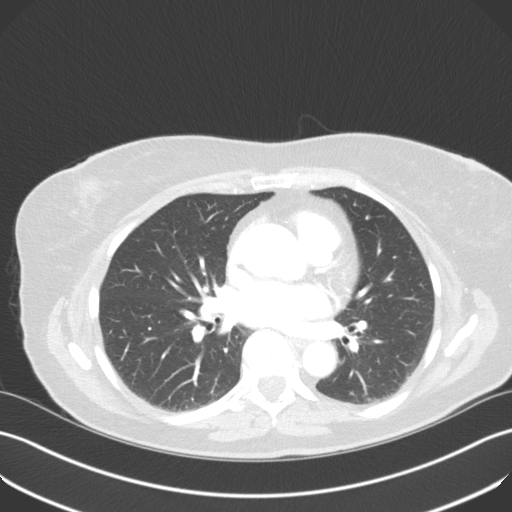
[im 61/106  soft-tissue]
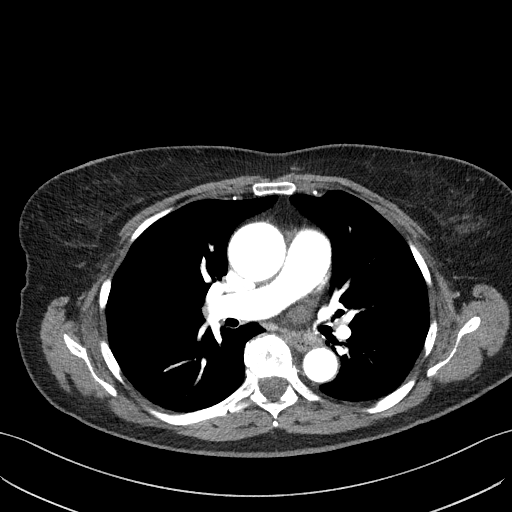
[im 68/106  lung]
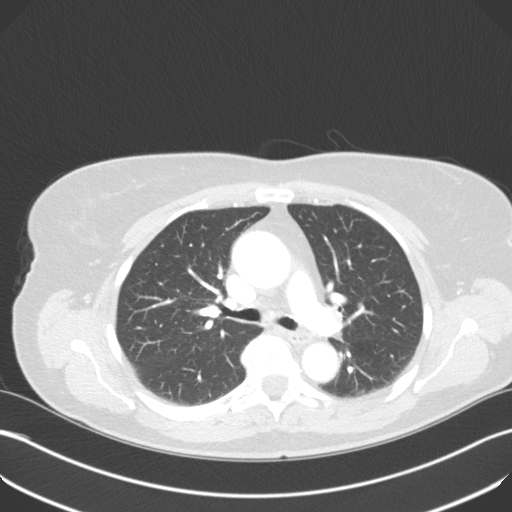
[im 76/106  soft-tissue]
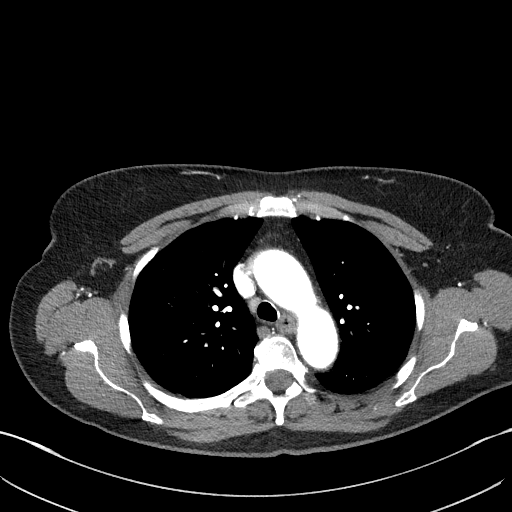
[im 83/106  lung]
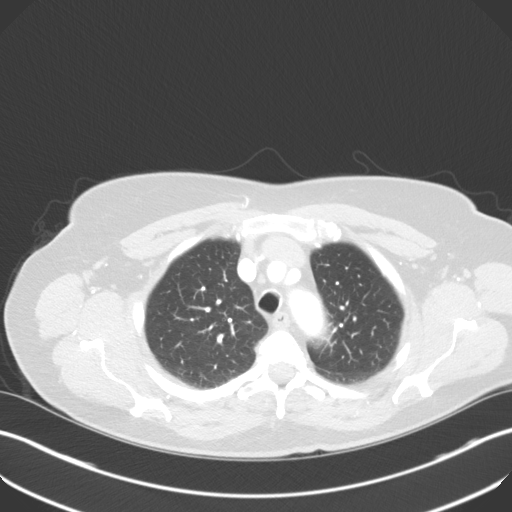
[im 91/106  soft-tissue]
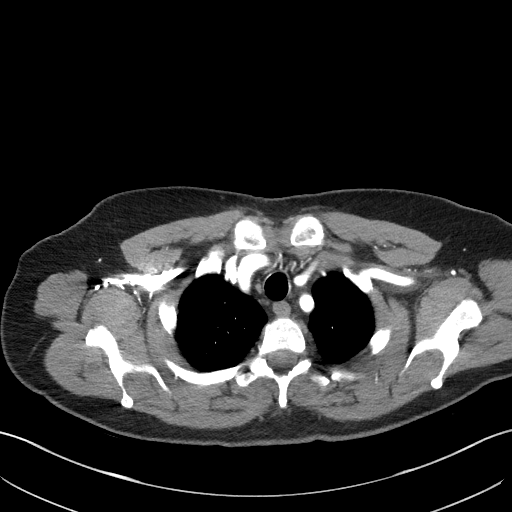
[im 98/106  lung]
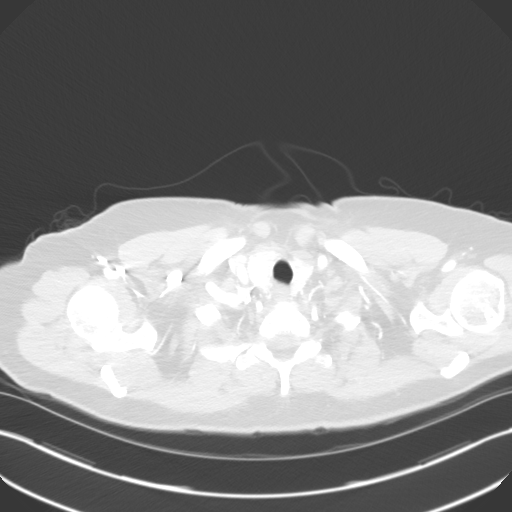

[Series 5: lung · axial · 0.74mm/px · z∈[-312,-268]mm · 2 of 106 slices shown]
[im 8/106  soft-tissue]
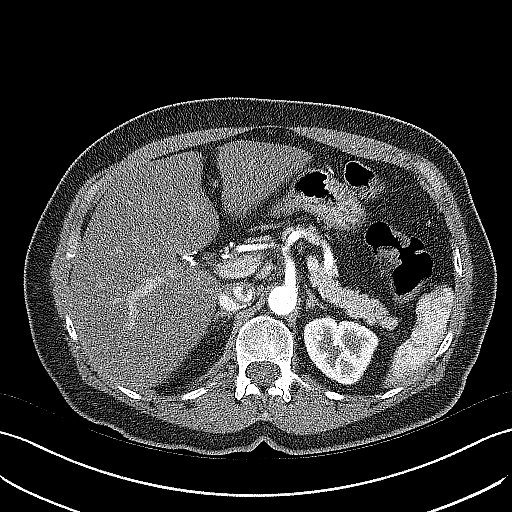
[im 23/106  soft-tissue]
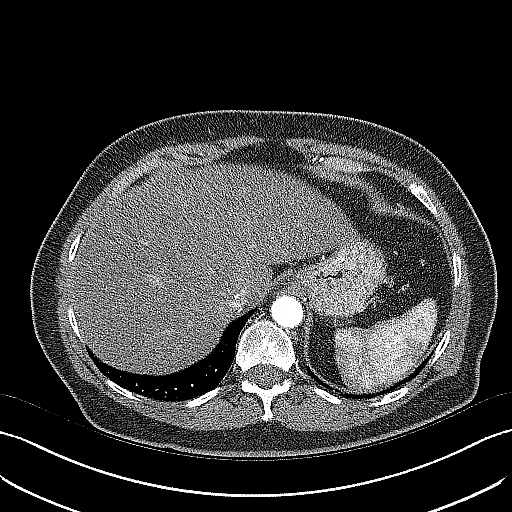

[Series 7: coronals · coronal · 0.61mm/px · 3 of 108 slices shown]
[im 27/108  soft-tissue]
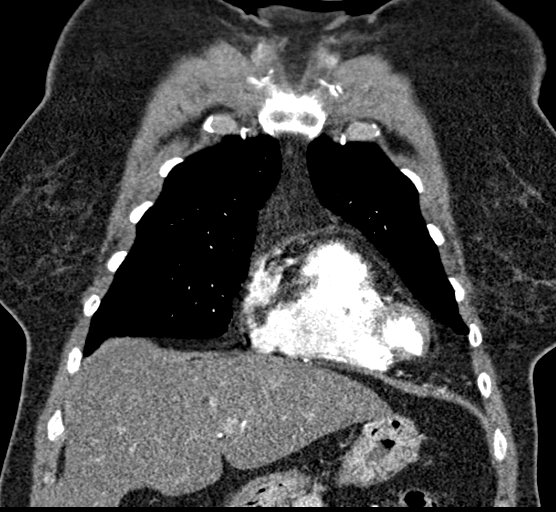
[im 54/108  soft-tissue]
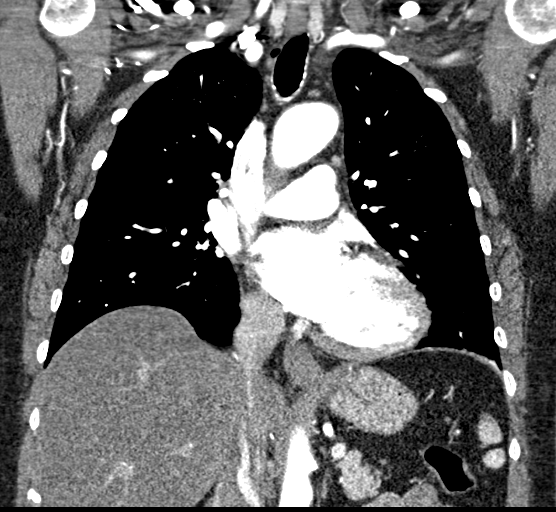
[im 81/108  soft-tissue]
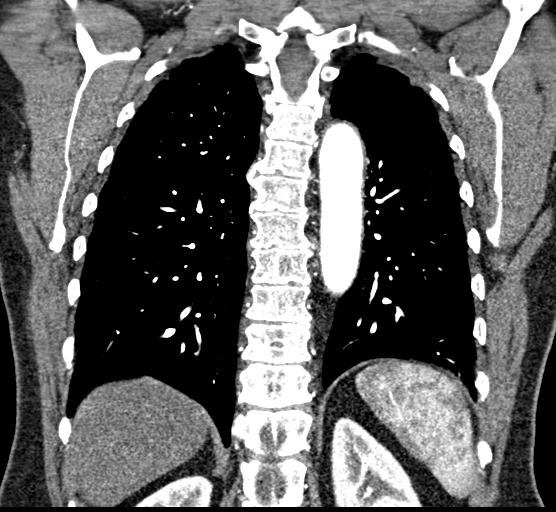

[18 of 46 positions shown; findings below may reference images not displayed]

RADIATION DOSE REDUCTION: This exam was performed according to the
departmental dose-optimization program which includes automated
exposure control, adjustment of the mA and/or kV according to
patient size and/or use of iterative reconstruction technique.

CONTRAST:  100mL OMNIPAQUE IOHEXOL 350 MG/ML SOLN
FINDINGS: Cardiovascular: The aortic root measures approximately 4.1-4.3 cm in
diameter at the level of the sinuses of Valsalva. The ascending
thoracic aorta demonstrates stable mild aneurysmal dilatation with
maximum diameter of approximately 4.2 cm. The proximal arch measures
3.1 cm and the distal arch 2.9 cm. The descending thoracic aorta
measures 2.5 cm. No evidence of aortic dissection. Visualized
proximal great vessels demonstrate normal patency and branching
anatomy.

The heart size is within normal limits. No pericardial fluid
identified. Mild calcified coronary artery plaque. Central pulmonary
arteries are normal in caliber.

Mediastinum/Nodes: No enlarged mediastinal, hilar, or axillary lymph
nodes. Thyroid gland, trachea, and esophagus demonstrate no
significant findings.

Lungs/Pleura: Stable 3 mm fissural nodule in the right lung along
the medial aspect of the major fissure. Calcified granuloma in the
anterior right upper lobe.

Upper Abdomen: Evidence of hepatic steatosis.

Musculoskeletal: No chest wall abnormality. No acute or significant
osseous findings.

Review of the MIP images confirms the above findings.
IMPRESSION: 1. Stable mild aneurysmal disease of the ascending thoracic aorta
measuring up to approximately 4.2 cm in maximum diameter. Recommend
annual imaging followup by CTA or MRA. This recommendation follows
9434 ACCF/AHA/AATS/ACR/ASA/SCA/MARLIX/DEEQA RAYAAN/BOZENA/JOSINETE Guidelines for the
Diagnosis and Management of Patients with Thoracic Aortic Disease.
Circulation. 9434; 121: E266-e369. Aortic aneurysm NOS (QR6G9-YZT.R)
2. Coronary atherosclerosis again identified with mild visualized
calcified coronary artery plaque.
3. Stable 3 mm fissural nodule in the right major fissure appears
benign.
4. Calcified granuloma in the right upper lobe.
5. Hepatic steatosis.

Aortic aneurysm NOS (QR6G9-YZT.R).

## 2024-02-10 ENCOUNTER — Telehealth: Payer: Self-pay | Admitting: Internal Medicine

## 2024-02-10 NOTE — Telephone Encounter (Unsigned)
Copied from CRM [id number]. Topic: Clinical - Medical Advice >> Feb 10, 2024  2:32 PM Clayton Bibles wrote: Reason for CRM:  Emmalyne would like a call back from [facility]. She would not tell me what it was about. Please call her at [phone]

## 2024-02-13 ENCOUNTER — Encounter: Payer: Self-pay | Admitting: Family Medicine

## 2024-02-13 ENCOUNTER — Telehealth: Payer: Medicare Other | Admitting: Family Medicine

## 2024-02-13 DIAGNOSIS — R059 Cough, unspecified: Secondary | ICD-10-CM | POA: Diagnosis not present

## 2024-02-13 DIAGNOSIS — R519 Headache, unspecified: Secondary | ICD-10-CM | POA: Diagnosis not present

## 2024-02-13 DIAGNOSIS — J111 Influenza due to unidentified influenza virus with other respiratory manifestations: Secondary | ICD-10-CM | POA: Diagnosis not present

## 2024-02-13 DIAGNOSIS — J069 Acute upper respiratory infection, unspecified: Secondary | ICD-10-CM

## 2024-02-13 MED ORDER — DOXYCYCLINE HYCLATE 100 MG PO TABS: 100.0000 mg | ORAL_TABLET | Freq: Two times a day (BID) | ORAL | 0 refills | Status: DC

## 2024-02-13 MED ORDER — BENZONATATE 100 MG PO CAPS: 100.0000 mg | ORAL_CAPSULE | Freq: Three times a day (TID) | ORAL | 0 refills | Status: DC | PRN

## 2024-02-13 NOTE — Patient Instructions (Signed)
Sorry to hear that you are still feeling sick.  As we discussed you could have a viral illness such as influenza but with the persistent headache and worsening congestion, you could have an early sinus infection.  I sent antibiotics to your pharmacy as well as Jerilynn Som if needed for cough.  Mucinex over the counter can be helpful for cough as well.  Saline nasal spray can help with nasal congestion, make sure to drink plenty of fluids and rest.  I do recommend being seen in person if you are not improving in the next few days, sooner if any worsening symptoms.  Hang in there.  Dr. Neva Seat

## 2024-02-13 NOTE — Progress Notes (Signed)
Virtual Visit via Video Note Not on video at 410pm - repeat link sent.  4:16 PM -patient connected with no initial video or audio, when audio connected significant echo in the background.  I disconnected and reconnected - still unsuccessful.  Called patient -she stated she could not see me and hear me but unable to unmutated or connected to video on her phone.  Discussed with staff, will have my CMA contact patient to see if she can help get her connected. 5:01 PM Not on link.  I connected with Martha Orr on 02/13/24 at 5:02pm by a video enabled telemedicine application and verified that I am speaking with the correct person using two identifiers.  Patient location: home, with spouse Martha Orr. Consent obtained to discuss PHI.  My location: office [place].    I discussed the limitations, risks, security and privacy concerns of performing an evaluation and management service by telephone and the availability of in person appointments. I also discussed with the patient that there may be a patient responsible charge related to this service. The patient expressed understanding and agreed to proceed, consent obtained  Chief complaint:  Chief Complaint  Patient presents with   Nasal Congestion    Pt notes started a week ago with congestion, headache, possible fever -unconfirmed, difficulty sleeping, denies chest pain, no known exposure.      History of Present Illness: Martha Orr is a 68 y.o. female  Headache, congestion: Started 1 week ago with headache, hoarse voice, sneezing, coughing. Scratchy throat. No known sick contacts. Upper/frontal headache - worse past week. Yellow nasal congestion. Fever at times - subjective. No measured temp.  Some cough - minimally productive.  No chest congestion, no chest pain, no shortness of breath.  Expired home covid test  - no results. No other testing.  No known exposure to covid, flu.  Drinking fluids. Feeling fatigued - could  drink more.  Some diarrhea - improved with otc meds - otc immodium.   Tx:  [place], tylenol, [place].   Did not have flu vaccine.   Patient Active Problem List   Diagnosis Date Noted   Atherosclerosis of aorta (HCC) 08/25/2023   Hand pain, left 08/19/2023   Obesity (BMI 30-39.9) 05/30/2023   Snoring 05/30/2023   Hiatal hernia 02/11/2023   History of colon polyps 02/11/2023   Hemorrhoid 02/11/2023   Occult blood in stools 10/29/2022   Lumbar arthropathy 10/08/2022   Hepatic steatosis 10/04/2022   Nausea 09/27/2022   Thoracic aortic aneurysm (HCC) 02/09/2022   Allergic rhinitis 02/09/2022   Hematuria 05/04/2021   Hypertension 02/06/2021   Hyperglycemia 02/06/2021   Cough, persistent 05/21/2020   Gastroesophageal reflux disease without esophagitis 05/21/2020   Globus pharyngeus 05/21/2020   Pulmonary nodules 04/23/2020   Otalgia of both ears 04/23/2020   Major depressive disorder, single episode, in remission (HCC) 08/02/2019   Anxiety 08/02/2019   Dyslipidemia 08/02/2019   Hypothyroidism 08/02/2019   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   Osteoarthritis 08/02/2019   Restless legs 08/02/2019   Bilateral primary osteoarthritis of knee 10/12/2016   Past Medical History:  Diagnosis Date   Abdominal pain, chronic, right lower quadrant 09/27/2022   68 year old female Abdominal pain across her entire right side but has remote removal of uterus, appendix, and gallbladder. Pain started 10/07/22.  Nausea and abdominal pain on the right, often occurs after eating.  Does not have any diarrhea or constipation.  Does not improve with stool softening.  Seems worse after  large meal.  Partial response to bentyl.   Is not affected by urinating does have   Anxiety    Arthritis    Chronic back pain    buldging disc   Depression    takes Lexapro daily   Family history of adverse reaction to anesthesia    sister gets sick after anesthesia   Gallstones    Hepatic steatosis 10/04/2022   High  cholesterol    takes Crestor daily   History of appendectomy 10/04/2022   History of cholecystectomy 10/04/2022   History of colon polyps    benign   History of hysterectomy 10/04/2022   History of shingles    Hypertension    Hypothyroidism    takes Synthroid daily   Joint pain    Joint swelling    Microscopic hematuria    states her entire life and family is the same way   Nausea 09/27/2022   Past Surgical History:  Procedure Laterality Date   ABDOMINAL HYSTERECTOMY  1998   APPENDECTOMY     BREAST BIOPSY     BUNIONECTOMY Bilateral    CARPAL TUNNEL RELEASE Bilateral    CHOLECYSTECTOMY     COLONOSCOPY     KNEE ARTHROSCOPY Right    KNEE ARTHROSCOPY Left 11/01/2017   Procedure: ARTHROSCOPY LEFT KNEE;  Surgeon: Marcene Corning, MD;  Location: [facility] OR;  Service: Orthopedics;  Laterality: Left;   knot removed from right breast     LAPAROSCOPY N/A 04/15/2023   Procedure: LAPAROSCOPY DIAGNOSTIC;  Surgeon: Lucretia Roers, MD;  Location: [facility];  Service: General;  Laterality: N/A;   NASAL SINUS SURGERY     TONSILLECTOMY     TONSILLECTOMY AND ADENOIDECTOMY  2002   TOTAL KNEE ARTHROPLASTY Bilateral 10/12/2016   Procedure: TOTAL KNEE BILATERAL;  Surgeon: Marcene Corning, MD;  Location: [facility] OR;  Service: Orthopedics;  Laterality: Bilateral;   No Known Allergies Prior to Admission medications   Medication Sig Start Date End Date Taking? Authorizing Provider  acetaminophen (TYLENOL) 325 MG tablet Take 325 mg by mouth every 6 (six) hours as needed for moderate pain.   Yes [provider]  Biotin w/ Vitamins C & E (HAIR/SKIN/NAILS PO) Take 1 capsule by mouth daily.   Yes [provider]  cholecalciferol (VITAMIN D) 1000 units tablet Take 1,000 Units by mouth at bedtime.   Yes [provider]  diclofenac Sodium (VOLTAREN) 1 % GEL Apply 4 g topically 4 (four) times daily as needed. 08/19/23  Yes Lula Olszewski, MD  escitalopram (LEXAPRO) 20 MG tablet TAKE 1 TABLET  DAILY 08/01/23  Yes Lula Olszewski, MD  fluticasone [facility]) 50 MCG/ACT nasal spray Place 2 sprays into both nostrils daily as needed for allergies.   Yes [provider]  Ginkgo Biloba Extract 60 MG CAPS Take 1 capsule by mouth daily.   Yes [provider]  hydrochlorothiazide (MICROZIDE) 12.5 MG capsule TAKE 1 CAPSULE DAILY 03/01/23  Yes Lula Olszewski, MD  levocetirizine (XYZAL) 5 MG tablet Take 1 tablet (5 mg total) by mouth every evening. 11/22/23  Yes Lula Olszewski, MD  omeprazole (PRILOSEC) 40 MG capsule TAKE 1 CAPSULE DAILY AS    NEEDED Patient taking differently: Take 40 mg by mouth daily. 03/01/23  Yes Lula Olszewski, MD  rosuvastatin (CRESTOR) 40 MG tablet Take 1 tablet (40 mg total) by mouth at bedtime. 11/28/23  Yes Lula Olszewski, MD  SYNTHROID 112 MCG tablet TAKE 1 TABLET AT BEDTIME 03/01/23  Yes  Lula Olszewski, MD  tirzepatide [facility]) 5 MG/0.5ML Pen Inject 5 mg into the skin once a week. 11/22/23  Yes Lula Olszewski, MD   Social History   Socioeconomic History   Marital status: Married    Spouse name: Not on file   Number of children: 2   Years of education: Not on file   Highest education level: Not on file  Occupational History   Not on file  Tobacco Use   Smoking status: Never   Smokeless tobacco: Never  Vaping Use   Vaping status: Never Used  Substance and Sexual Activity   Alcohol use: Yes    Comment: occasionally   Drug use: No   Sexual activity: Not on file  Other Topics Concern   Not on file  Social History Narrative   Not on file   Social Drivers of Health   Financial Resource Strain: Low Risk  (02/13/2024)   Overall Financial Resource Strain (CARDIA)    Difficulty of Paying Living Expenses: Not hard at all  Food Insecurity: No Food Insecurity (02/08/2022)   Hunger Vital Sign    Worried About Running Out of Food in the Last Year: Never true    Ran Out of Food in the Last Year: Never true  Transportation Needs: No  Transportation Needs (02/08/2022)   PRAPARE - Administrator, Civil Service (Medical): No    Lack of Transportation (Non-Medical): No  Physical Activity: Unknown (02/13/2024)   Exercise Vital Sign    Days of Exercise per Week: 0 days    Minutes of Exercise per Session: Not on file  Stress: No Stress Concern Present (02/13/2024)   [employer] of Occupational Health - Occupational Stress Questionnaire    Feeling of Stress : Not at all  Social Connections: Moderately Integrated (02/13/2024)   Social Connection and Isolation Panel [NHANES]    Frequency of Communication with Friends and Family: More than three times a week    Frequency of Social Gatherings with Friends and Family: Once a week    Attends Religious Services: More than 4 times per year    Active Member of [employer] or Organizations: No    Attends Banker Meetings: Not on file    Marital Status: Married  Catering manager Violence: Not At Risk (02/08/2022)   Humiliation, Afraid, Rape, and Kick questionnaire    Fear of Current or Ex-Partner: No    Emotionally Abused: No    Physically Abused: No    Sexually Abused: No    Observations/Objective: There were no vitals filed for this visit. Nontoxic appearance on video.  Speaking full sentences without respiratory distress, audible wheeze or stridor.  Coherent responses, all questions answered with understanding of plan expressed.  Assessment and Plan: Cough, unspecified type - Plan: benzonatate (TESSALON PERLES) 100 MG capsule, doxycycline (VIBRA-TABS) 100 MG tablet  Sinus headache - Plan: doxycycline (VIBRA-TABS) 100 MG tablet  Upper respiratory tract infection, unspecified type  Influenza-like illness Approximately 1 week history of influenza-like illness with headache, cough, congestion.  Worsening headache and sinus pressure past few days with discolored nasal discharge, possible early bacterial sinusitis.  Start doxycycline with potential side  effects and risks of antibiotics discussed.  Symptomatic care for cough, body aches with antipyretics, fluids, Mucinex, saline nasal spray and rest.  Tessalon Perles as needed for cough.  If not improving next few days or any worsening symptoms recommended an office appointment.  RTC/ER precautions given.  Follow Up Instructions: As above Patient  Instructions  Sorry to hear that you are still feeling sick.  As we discussed you could have a viral illness such as influenza but with the persistent headache and worsening congestion, you could have an early sinus infection.  I sent antibiotics to your pharmacy as well as Jerilynn Som if needed for cough.  Mucinex over the counter can be helpful for cough as well.  Saline nasal spray can help with nasal congestion, make sure to drink plenty of fluids and rest.  I do recommend being seen in person if you are not improving in the next few days, sooner if any worsening symptoms.  Hang in there.  Dr. Neva Seat    I discussed the assessment and treatment plan with the patient. The patient was provided an opportunity to ask questions and all were answered. The patient agreed with the plan and demonstrated an understanding of the instructions.   The patient was advised to call back or seek an in-person evaluation if the symptoms worsen or if the condition fails to improve as anticipated.   Shade Flood, MD

## 2024-03-07 ENCOUNTER — Telehealth: Payer: Self-pay

## 2024-03-07 DIAGNOSIS — I10 Essential (primary) hypertension: Secondary | ICD-10-CM

## 2024-03-07 NOTE — Telephone Encounter (Signed)
 Copied from CRM [id number]. Topic: Clinical - Prescription Issue >> Mar 07, 2024 12:44 PM Denese Killings wrote: Reason for CRM: Patient is requesting a callback from [facility] regarding a prescription hydrochlorothiazide (MICROZIDE) 12.5 MG capsule. She was advised to call doctor about it.     Forwarding this message to Dr. Jon Billings. Last OV: 11/22/23. Last sent in: a year supply on 03/01/23 by Dr. Jimmey Ralph. Donzetta Starch, CMA

## 2024-03-08 ENCOUNTER — Other Ambulatory Visit: Payer: Self-pay | Admitting: Internal Medicine

## 2024-03-08 DIAGNOSIS — I10 Essential (primary) hypertension: Secondary | ICD-10-CM

## 2024-03-08 MED ORDER — HYDROCHLOROTHIAZIDE 25 MG PO TABS
25.0000 mg | ORAL_TABLET | Freq: Every day | ORAL | 0 refills | Status: DC
Start: 2024-03-08 — End: 2024-03-08

## 2024-03-08 MED ORDER — HYDROCHLOROTHIAZIDE 12.5 MG PO CAPS: 12.5000 mg | ORAL_CAPSULE | Freq: Every day | ORAL | 3 refills | Status: DC

## 2024-03-08 NOTE — Addendum Note (Signed)
 Addended by: Lula Olszewski on: 03/08/2024 10:35 AM   Modules accepted: Orders

## 2024-03-08 NOTE — Progress Notes (Signed)
 Spoke with patient/patients husband by phone, who expressed frustration about numerous prior calls/requests for refills not being responded to.  Sent urgent refill to [facility] and bridge prescription to [facility].

## 2024-03-19 ENCOUNTER — Telehealth: Payer: Self-pay | Admitting: Neurology

## 2024-03-19 NOTE — Telephone Encounter (Signed)
 I called pt and relayed that she will need another visit per medicare requirement f77f within last 6 months.  I made the appt for tomorrow at 1415 on 03-20-2024.  I gave her directions  arrive 1400.

## 2024-03-19 NOTE — Telephone Encounter (Signed)
 Patient called in stated she wanted to know if she needs a new referral for [place] to have her HST done. She saw Dr. Frances Furbish last year, never did HST. Now is wanting to do this. Can a new order for this be put in?  Pt is expecting a call back

## 2024-03-20 ENCOUNTER — Ambulatory Visit: Admitting: Neurology

## 2024-03-20 ENCOUNTER — Encounter: Payer: Self-pay | Admitting: Neurology

## 2024-03-20 VITALS — BP 109/75 | HR 94 | Ht 65.5 in | Wt 171.8 lb

## 2024-03-20 DIAGNOSIS — G478 Other sleep disorders: Secondary | ICD-10-CM | POA: Diagnosis not present

## 2024-03-20 DIAGNOSIS — R0683 Snoring: Secondary | ICD-10-CM

## 2024-03-20 DIAGNOSIS — G4719 Other hypersomnia: Secondary | ICD-10-CM

## 2024-03-20 DIAGNOSIS — G2581 Restless legs syndrome: Secondary | ICD-10-CM

## 2024-03-20 DIAGNOSIS — Z9189 Other specified personal risk factors, not elsewhere classified: Secondary | ICD-10-CM

## 2024-03-20 DIAGNOSIS — G4761 Periodic limb movement disorder: Secondary | ICD-10-CM

## 2024-03-20 DIAGNOSIS — E663 Overweight: Secondary | ICD-10-CM

## 2024-03-20 NOTE — Patient Instructions (Signed)
 We will proceed with a sleep study through our sleep lab if possible. As explained, an attended sleep study (meaning you get to stay overnight in the sleep lab), lets [place] monitor sleep-related behaviors such as sleep talking and leg movements in sleep, in addition to monitoring for sleep apnea.  A home sleep test is a screening tool for sleep apnea diagnosis only, but unfortunately, does not help with any other sleep-related diagnoses.  Please remember, the long-term risks and ramifications of untreated moderate to severe obstructive sleep apnea may include (but are not limited to): increased risk for cardiovascular disease, including congestive heart failure, stroke, difficult to control hypertension, treatment resistant obesity, arrhythmias, especially irregular heartbeat commonly known as A. Fib. (atrial fibrillation); even type 2 diabetes has been linked to untreated OSA.   Other correlations that untreated obstructive sleep apnea include macular edema which is swelling of the retina in the eyes, droopy eyelid syndrome, and elevated hemoglobin and hematocrit levels (often referred to as polycythemia).  Sleep apnea can cause disruption of sleep and sleep deprivation in most cases, which, in turn, can cause recurrent headaches, problems with memory, mood, concentration, focus, and vigilance. Most people with untreated sleep apnea report excessive daytime sleepiness, which can affect their ability to drive. Please do not drive or use heavy equipment or machinery, if you feel sleepy! Patients with sleep apnea can also develop difficulty initiating and maintaining sleep (aka insomnia).   Having sleep apnea may increase your risk for other sleep disorders, including involuntary behaviors sleep such as sleep terrors, sleep talking, sleepwalking.    Having sleep apnea can also increase your risk for restless leg syndrome and leg movements at night.   Please note that untreated obstructive sleep apnea may  carry additional perioperative morbidity. Patients with significant obstructive sleep apnea (typically, in the moderate to severe degree) should receive, if possible, perioperative PAP (positive airway pressure) therapy and the surgeons and particularly the anesthesiologists should be informed of the diagnosis and the severity of the sleep disordered breathing.   We will call you or email you through MyChart with regards to your test results and plan a follow-up in sleep clinic accordingly. Most likely, you will hear from one of our nurses.   Our sleep lab administrative assistant will call you to schedule your sleep study and give you further instructions, regarding the check in process for the sleep study, arrival time, what to bring, when you can expect to leave after the study, etc., and to answer any other logistical questions you may have. If you don't hear back from her by about 2 weeks from now, please feel free to call her direct line at [phone] or you can call our general clinic number, or email [place] through My Chart.

## 2024-03-20 NOTE — Progress Notes (Addendum)
 Subjective:    Patient ID: Martha Orr is a 68 y.o. female.  HPI    Interim history:   [name] is a 68 year old female with an underlying medical history of hypertension, hypothyroidism, hyperlipidemia, restless leg syndrome, anxiety, depression, chronic back pain, arthritis, status post bilateral knee replacement surgeries, status post bilateral carpal tunnel surgeries, status post tonsillectomy, and overweight state, who presents for follow-up consultation of her sleep disturbance, concern for obstructive sleep apnea.  The patient is accompanied by her husband today.  I first met her at the request of her primary care physician on 07/14/2023, at which time she reported snoring and sleep disruption.  She had daytime tiredness.  She was advised to proceed with a sleep study.  She did not proceed with testing at the time.  Today, 03/20/2024: She reports worsening daytime somnolence.  Her husband also feels that her snoring has become louder.  She has lost weight in the past 6 months. She was on Zepbound  but is currently not on it.  She reports ongoing symptoms of restless leg syndrome, typically when she is in bed at night trying to fall asleep.  She takes Tylenol  PM every night.  She goes to bed somewhere between 11 PM and 1 AM.  Typical rise time is between 10 AM and noon.  She has not tried melatonin.  Her husband endorses that she has leg twitching in her sleep.  She also has some mumbling with sleep talking at times.  She is not sure if she had sleep talking as a child. Her Epworth sleepiness score is 13 out of 24, fatigue severity score is 40 out of 63.  The patient's allergies, current medications, family history, past medical history, past social history, past surgical history and problem list were reviewed and updated as appropriate.   Previously:   07/14/2023: (She) reports snoring, sleep disruption, and excessive daytime somnolence.  Her Epworth sleepiness score is 5 out of 24,  fatigue severity score is 48 out of 63.  I reviewed your office note from 05/30/2023.  Per husband's feedback, she moves her feet during sleep.  She has had restless leg symptoms for the past few years, usually takes Tylenol  PM at night to help her symptoms, restless leg symptoms started after her bilateral knee replacement surgeries in 2017.  She reports that 2 of her sisters have RLS as well.  She also reports that 2 brothers and 1 sister have sleep apnea.  She goes to bed between 11 and midnight and rise time is between 9 and 10.  She does not have nightly nocturia or recurrent morning headaches.  She is retired, she worked as a Geophysical data processor.  She had a tonsillectomy.  Weight has been slowly coming down.  She does not watch TV in her bedroom.  She lives with her husband, they have 2 grown children, ages 40 and 9.  They have 1 dog in the household.  She drinks caffeine in the form of coffee, 1 cup in the morning, occasional alcohol, she is a non-smoker.  She has been on Lexapro  for over 20 years.  Her Past Medical History Is Significant For: Past Medical History:  Diagnosis Date   Abdominal pain, chronic, right lower quadrant 09/27/2022   68 year old female Abdominal pain across her entire right side but has remote removal of uterus, appendix, and gallbladder. Pain started 10/07/22.  Nausea and abdominal pain on the right, often occurs after eating.  Does not have any diarrhea or constipation.  Does not improve with stool softening.  Seems worse after large meal.  Partial response to bentyl .   Is not affected by urinating does have   Anxiety    Arthritis    Chronic back pain    buldging disc   Depression    takes Lexapro  daily   Family history of adverse reaction to anesthesia    sister gets sick after anesthesia   Gallstones    Hepatic steatosis 10/04/2022   High cholesterol    takes Crestor  daily   History of appendectomy 10/04/2022   History of cholecystectomy 10/04/2022   History of  colon polyps    benign   History of hysterectomy 10/04/2022   History of shingles    Hypertension    Hypothyroidism    takes Synthroid  daily   Joint pain    Joint swelling    Microscopic hematuria    states her entire life and family is the same way   Nausea 09/27/2022    Her Past Surgical History Is Significant For: Past Surgical History:  Procedure Laterality Date   ABDOMINAL HYSTERECTOMY  1998   APPENDECTOMY     BREAST BIOPSY     BUNIONECTOMY Bilateral    CARPAL TUNNEL RELEASE Bilateral    CHOLECYSTECTOMY     COLONOSCOPY     KNEE ARTHROSCOPY Right    KNEE ARTHROSCOPY Left 11/01/2017   Procedure: ARTHROSCOPY LEFT KNEE;  Surgeon: Dayne Even, MD;  Location: [facility];  Service: Orthopedics;  Laterality: Left;   knot removed from right breast     LAPAROSCOPY N/A 04/15/2023   Procedure: LAPAROSCOPY DIAGNOSTIC;  Surgeon: Awilda Bogus, MD;  Location: [facility];  Service: General;  Laterality: N/A;   NASAL SINUS SURGERY     TONSILLECTOMY     TONSILLECTOMY AND ADENOIDECTOMY  2002   TOTAL KNEE ARTHROPLASTY Bilateral 10/12/2016   Procedure: TOTAL KNEE BILATERAL;  Surgeon: Dayne Even, MD;  Location: [facility] OR;  Service: Orthopedics;  Laterality: Bilateral;    Her Family History Is Significant For: Family History  Problem Relation Age of Onset   Heart attack Mother    Hyperlipidemia Mother    Arthritis Father    Arthritis Sister    Arthritis Sister    Heart attack Sister    Arthritis Sister    Hyperlipidemia Sister    Stroke Sister    Sleep apnea Brother    Cancer Brother    Depression Brother    Heart attack Brother    Heart disease Brother    Hypertension Brother    Hyperlipidemia Brother    Cancer Brother    Heart attack Brother    Colon cancer Neg Hx    Esophageal cancer Neg Hx    Rectal cancer Neg Hx    Stomach cancer Neg Hx     Her Social History Is Significant For: Social History   Socioeconomic History   Marital status: Married    Spouse name: Not  on file   Number of children: 2   Years of education: Not on file   Highest education level: Not on file  Occupational History   Not on file  Tobacco Use   Smoking status: Never   Smokeless tobacco: Never  Vaping Use   Vaping status: Never Used  Substance and Sexual Activity   Alcohol use: Yes    Comment: occasionally   Drug use: No   Sexual activity: Not on file  Other Topics Concern   Not on file  Social History  Narrative   Not on file   Social Drivers of Health   Financial Resource Strain: Low Risk  (02/13/2024)   Overall Financial Resource Strain (CARDIA)    Difficulty of Paying Living Expenses: Not hard at all  Food Insecurity: No Food Insecurity (02/08/2022)   Hunger Vital Sign    Worried About Running Out of Food in the Last Year: Never true    Ran Out of Food in the Last Year: Never true  Transportation Needs: No Transportation Needs (02/08/2022)   PRAPARE - Administrator, Civil Service (Medical): No    Lack of Transportation (Non-Medical): No  Physical Activity: Unknown (02/13/2024)   Exercise Vital Sign    Days of Exercise per Week: 0 days    Minutes of Exercise per Session: Not on file  Stress: No Stress Concern Present (02/13/2024)   [employer] of Occupational Health - Occupational Stress Questionnaire    Feeling of Stress : Not at all  Social Connections: Moderately Integrated (02/13/2024)   Social Connection and Isolation Panel [NHANES]    Frequency of Communication with Friends and Family: More than three times a week    Frequency of Social Gatherings with Friends and Family: Once a week    Attends Religious Services: More than 4 times per year    Active Member of [employer] or Organizations: No    Attends Engineer, structural: Not on file    Marital Status: Married    Her Allergies Are:  No Known Allergies:   Her Current Medications Are:  Outpatient Encounter Medications as of 03/20/2024  Medication Sig   acetaminophen  (TYLENOL )  325 MG tablet Take 325 mg by mouth every 6 (six) hours as needed for moderate pain.   Biotin w/ Vitamins C & E (HAIR/SKIN/NAILS PO) Take 1 capsule by mouth daily.   cholecalciferol (VITAMIN D ) 1000 units tablet Take 1,000 Units by mouth at bedtime.   diclofenac  Sodium (VOLTAREN ) 1 % GEL Apply 4 g topically 4 (four) times daily as needed.   escitalopram  (LEXAPRO ) 20 MG tablet TAKE 1 TABLET DAILY   fluticasone (FLONASE) 50 MCG/ACT nasal spray Place 2 sprays into both nostrils daily as needed for allergies.   Ginkgo Biloba Extract 60 MG CAPS Take 1 capsule by mouth daily.   hydrochlorothiazide  (MICROZIDE ) 12.5 MG capsule Take 1 capsule (12.5 mg total) by mouth daily.   hydrochlorothiazide  (MICROZIDE ) 12.5 MG capsule Take 1 capsule (12.5 mg total) by mouth daily.   levocetirizine (XYZAL ) 5 MG tablet Take 1 tablet (5 mg total) by mouth every evening.   rosuvastatin  (CRESTOR ) 40 MG tablet Take 1 tablet (40 mg total) by mouth at bedtime.   SYNTHROID  112 MCG tablet TAKE 1 TABLET AT BEDTIME   [DISCONTINUED] omeprazole  (PRILOSEC) 40 MG capsule TAKE 1 CAPSULE DAILY AS    NEEDED (Patient taking differently: Take 40 mg by mouth daily.)   [DISCONTINUED] tirzepatide  (ZEPBOUND ) 5 MG/0.5ML Pen Inject 5 mg into the skin once a week.   [DISCONTINUED] benzonatate  (TESSALON  PERLES) 100 MG capsule Take 1 capsule (100 mg total) by mouth 3 (three) times daily as needed for cough. (Patient not taking: Reported on 03/20/2024)   [DISCONTINUED] doxycycline  (VIBRA -TABS) 100 MG tablet Take 1 tablet (100 mg total) by mouth 2 (two) times daily. (Patient not taking: Reported on 03/20/2024)   Facility-Administered Encounter Medications as of 03/20/2024  Medication   acidophilus (RISAQUAD) capsule 1 capsule  :  Review of Systems:  Out of a complete 14 point review of  systems, all are reviewed and negative with the exception of these symptoms as listed below:  Review of Systems  Neurological:        Restart process over.  Has  Medicare (needed F2F appt) Snoring, RLS, not resting well, ESS 13  FSS 40.    Objective:  Neurological Exam  Physical Exam Physical Examination:   Vitals:   03/20/24 1406  BP: 109/75  Pulse: 94    General Examination: The patient is a very pleasant 68 y.o. female in no acute distress. She appears well-developed and well-nourished and well groomed.   HEENT: Normocephalic, atraumatic, pupils are equal, round and reactive to light, extraocular tracking is good without limitation to gaze excursion or nystagmus noted. Hearing is grossly intact. Face is symmetric with normal facial animation. Speech is clear with no dysarthria noted. There is no hypophonia. There is no lip, neck/head, jaw or voice tremor. Neck is supple with full range of passive and active motion. There are no carotid bruits on auscultation. Oropharynx exam reveals: mild mouth dryness, adequate dental hygiene and moderate airway crowding, due to small airway entry and redundant soft palate, Mallampati class II, minimal overbite noted, tonsils absent.  Tongue protrudes centrally and palate elevates symmetrically, neck circumference 15-1/4 inches.   Chest: Clear to auscultation without wheezing, rhonchi or crackles noted.   Heart: S1+S2+0, regular and normal without murmurs, rubs or gallops noted.    Abdomen: Soft, non-tender and non-distended.   Extremities: There is no pitting edema in the distal lower extremities bilaterally.    Skin: Warm and dry without trophic changes noted.    Musculoskeletal: exam reveals no obvious joint deformities.    Neurologically:  Mental status: The patient is awake, alert and oriented in all 4 spheres. Her immediate and remote memory, attention, language skills and fund of knowledge are appropriate. There is no evidence of aphasia, agnosia, apraxia or anomia. Speech is clear with normal prosody and enunciation. Thought process is linear. Mood is normal and affect is normal.  Cranial nerves  II - XII are as described above under HEENT exam.  Motor exam: Normal bulk, strength and tone is noted. There is no obvious action or resting tremor.  Fine motor skills and coordination: grossly intact.  Cerebellar testing: No dysmetria or intention tremor. There is no truncal or gait ataxia.  Sensory exam: intact to light touch in the upper and lower extremities.  Gait, station and balance: She stands easily. No veering to one side is noted. No leaning to one side is noted. Posture is age-appropriate and stance is narrow based. Gait shows normal stride length and normal pace. No problems turning are noted.    Assessment and Plan:    In summary, REMILYNN RICKELS is a very pleasant 68 year old female with an underlying medical history of hypertension, hypothyroidism, hyperlipidemia, restless leg syndrome, anxiety, depression, chronic back pain, arthritis, status post bilateral knee replacement surgeries in 2018, status post bilateral carpal tunnel surgeries, status post tonsillectomy, and overweight state, whose history and physical exam are concerning for sleep disordered breathing, particularly obstructive sleep apnea (OSA) versus upper airway resistance syndrome (UARS) versus central sleep apnea (CSA), or mixed sleep apnea. A laboratory attended sleep study is typically considered "gold standard" for evaluation of sleep disordered breathing.  A laboratory study will allow us  to look for her leg twitching at night.  She is advised that Lexapro  can cause leg twitching at night.  She is advised that Lexapro  can also increase restless leg symptoms  but if she is stable on the medication, there is no pressing need to change her antidepressant medication.  She is discouraged from taking Tylenol  PM at night as it can exacerbate RLS symptoms.  She is encouraged to work on her sleep habits and moving her bedtime and rise time and consider melatonin trial.  I talked to the patient and her husband about OSA, its  prognosis and treatment options. We talked about medical/conservative treatments, surgical interventions and non-pharmacological approaches for symptom control. I explained, in particular, the risks and ramifications of untreated moderate to severe OSA, especially with respect to developing cardiovascular disease down the road, including congestive heart failure (CHF), difficult to treat hypertension, cardiac arrhythmias (particularly A-fib), neurovascular complications including TIA, stroke and dementia. Even type 2 diabetes has, in part, been linked to untreated OSA. Symptoms of untreated OSA may include (but may not be limited to) daytime sleepiness, nocturia (i.e. frequent nighttime urination), memory problems, mood irritability and suboptimally controlled or worsening mood disorder such as depression and/or anxiety, lack of energy, lack of motivation, physical discomfort, as well as recurrent headaches, especially morning or nocturnal headaches. We talked about the importance of maintaining a healthy lifestyle and striving for healthy weight. In addition, we talked about the importance of striving for and maintaining good sleep hygiene. I recommended a sleep study at this time. I outlined the differences between a laboratory attended sleep study which is considered more comprehensive and accurate over the option of a home sleep test (HST); the latter may lead to underestimation of sleep disordered breathing in some instances and does not help with diagnosing upper airway resistance syndrome and is not accurate enough to diagnose primary central sleep apnea typically. I outlined possible surgical and non-surgical treatment options of OSA, including the use of a positive airway pressure (PAP) device (i.e. CPAP, AutoPAP/APAP or BiPAP in certain circumstances), a custom-made dental device (aka oral appliance, which would require a referral to a specialist dentist or orthodontist typically, and is generally  speaking not considered for patients with full dentures or edentulous state), upper airway surgical options, such as traditional UPPP (which is not considered a first-line treatment) or the Inspire device (hypoglossal nerve stimulator, which would involve a referral for consultation with an ENT surgeon, after careful selection, following inclusion criteria - also not first-line treatment). I explained the PAP treatment option to the patient in detail, as this is generally considered first-line treatment.  The patient indicated that she would be willing to try PAP therapy, if the need arises. I explained the importance of being compliant with PAP treatment, not only for insurance purposes but primarily to improve patient's symptoms symptoms, and for the patient's long term health benefit, including to reduce Her cardiovascular risks longer-term.    We will pick up our discussion about the next steps and treatment options after testing.  We will keep her posted as to the test results by phone call and/or MyChart messaging where possible.  We will plan to follow-up in sleep clinic accordingly as well.  I answered all their questions today and the patient and her husband were in agreement.   I spent 30 minutes in total face-to-face time and in reviewing records during pre-charting, more than 50% of which was spent in counseling and coordination of care, reviewing test results, reviewing medications and treatment regimen and/or in discussing or reviewing the diagnosis of OSA, RLS, the prognosis and treatment options. Pertinent laboratory and imaging test results that were available during this visit  with the patient were reviewed by me and considered in my medical decision making (see chart for details).

## 2024-03-27 ENCOUNTER — Telehealth: Payer: Self-pay | Admitting: Neurology

## 2024-03-27 NOTE — Telephone Encounter (Signed)
 [employer] medicare no auth req.  Patient is scheduled at [facility] for 04/17/24  8 pm.  Mailed packet & sent mychart.

## 2024-04-02 ENCOUNTER — Other Ambulatory Visit: Payer: Self-pay | Admitting: Internal Medicine

## 2024-04-11 ENCOUNTER — Telehealth: Payer: Self-pay

## 2024-04-11 MED ORDER — LEVOTHYROXINE SODIUM 112 MCG PO TABS: 112.0000 ug | ORAL_TABLET | Freq: Every day | ORAL | 0 refills | Status: DC

## 2024-04-11 NOTE — Telephone Encounter (Signed)
 Copied from CRM [id number]. Topic: General - Other >> Apr 11, 2024 10:52 AM Chuck Crater wrote: Reason for CRM: Patient is requesting a callback from [facility] in regards prescriptions.

## 2024-04-11 NOTE — Telephone Encounter (Signed)
 Returned pt call and was advised that her last prescription for Levothyroxine was just delivered and only received 30 when typically gets a 90 day prescription. Advised patient I would sent in a 90 day supply and once we check labs at 04/26/24 visit with PCP if staying on same dose we can resent this prescription for her again with refills. Pt verbalized understanding and nothing further needed at this time.

## 2024-04-17 ENCOUNTER — Ambulatory Visit (INDEPENDENT_AMBULATORY_CARE_PROVIDER_SITE_OTHER): Admitting: Neurology

## 2024-04-17 DIAGNOSIS — G4720 Circadian rhythm sleep disorder, unspecified type: Secondary | ICD-10-CM

## 2024-04-17 DIAGNOSIS — G478 Other sleep disorders: Secondary | ICD-10-CM

## 2024-04-17 DIAGNOSIS — R0683 Snoring: Secondary | ICD-10-CM

## 2024-04-17 DIAGNOSIS — G2581 Restless legs syndrome: Secondary | ICD-10-CM

## 2024-04-17 DIAGNOSIS — G4761 Periodic limb movement disorder: Secondary | ICD-10-CM

## 2024-04-17 DIAGNOSIS — G4719 Other hypersomnia: Secondary | ICD-10-CM

## 2024-04-17 DIAGNOSIS — E663 Overweight: Secondary | ICD-10-CM

## 2024-04-17 DIAGNOSIS — G4733 Obstructive sleep apnea (adult) (pediatric): Secondary | ICD-10-CM

## 2024-04-17 DIAGNOSIS — G4734 Idiopathic sleep related nonobstructive alveolar hypoventilation: Secondary | ICD-10-CM

## 2024-04-17 DIAGNOSIS — Z9189 Other specified personal risk factors, not elsewhere classified: Secondary | ICD-10-CM

## 2024-04-17 DIAGNOSIS — R9431 Abnormal electrocardiogram [ECG] [EKG]: Secondary | ICD-10-CM

## 2024-04-24 NOTE — Addendum Note (Signed)
 Addended by: Kamori Kitchens on: 04/24/2024 06:23 PM   Modules accepted: Orders

## 2024-04-24 NOTE — Procedures (Signed)
 Physician Interpretation:     [facility] POLYSOMNOGRAPHY  INTERPRETATION REPORT   STUDY DATE:  04/17/2024     PATIENT NAME:  Martha Orr         DATE OF BIRTH:  [birth year]  PATIENT ID:  [id number]    TYPE OF STUDY:  PSG  READING PHYSICIAN: Debbra Fairy, MD, PhD   SCORING TECHNICIAN: Octavia Belton, RPSGT     Referred by: Anthon Kins, MD  ? History and Indication for Testing: 68 year old female with an underlying medical history of hypertension, hypothyroidism, hyperlipidemia, restless leg syndrome, anxiety, depression, chronic back pain, arthritis, status post bilateral knee replacement surgeries, status post bilateral carpal tunnel surgeries, status post tonsillectomy, and overweight state, who reports snoring and excessive daytime somnolence. Patient used a bite guard for this study. Her Epworth sleepiness score is 13 out of 24, fatigue severity score is 40 out of 63.  Height: 66 in Weight: 171 lb (BMI 28) Neck Size: 15 in    MEDICATIONS: Tylenol , Biotin w Vitamin C & E,Vitaamin D, Voltaren , Lexapro , Flonase, Ginko Biloba, Microzide , XYZal , Crestor , Synthroid , Risaquad   TECHNICAL DESCRIPTION: A registered sleep technologist was in attendance for the duration of the recording.  Data collection, scoring, video monitoring, and reporting were performed in compliance with the AASM Manual for the Scoring of Sleep and Associated Events; (Hypopnea is scored based on the criteria listed in Section VIII D. 1b in the AASM Manual V2.6 using a 4% oxygen desaturation rule or Hypopnea is scored based on the criteria listed in Section VIII D. 1a in the AASM Manual V2.6 using 3% oxygen desaturation and /or arousal rule).   SLEEP CONTINUITY AND SLEEP ARCHITECTURE:  Lights-out was at 21:52: and lights-on at  05:03:, with a total recording time of 7 hours, 11 min. Total sleep time ( TST) was 380.5 minutes with a normal sleep efficiency at 88.3%. There was  17.3% REM  sleep.  BODY POSITION:  TST was divided  between the following sleep positions: 14.3% supine;  85.7% lateral;  0% prone. Duration of total sleep and percent of total sleep in their respective position is as follows: supine 54 minutes (14%), non-supine 326 minutes (86%); right 77 minutes (20%), left 248 minutes (65%), and prone 00 minutes (0%).  Total supine REM sleep time was 00 minutes (0% of total REM sleep).  Sleep latency was normal at 19.5 minutes.  REM sleep latency was increased at 204.0 minutes. Of the total sleep time, the percentage of stage N1 sleep was 7.0%, stage N2 sleep was 65%, which is mildly increased, stage N3 sleep was 10.8%, which is mildly reduced, and REM sleep was 17.3%, which is mildly reduced. Wake after sleep onset (WASO) time accounted for 31 minutes with minimal to mild sleep fragmentation noted.   RESPIRATORY MONITORING:   Based on CMS criteria (using a 4% oxygen desaturation rule for scoring hypopneas), there were 45 apneas (39 obstructive; 3 central; 3 mixed), and 100 hypopneas.  Apnea index was 7.1. Hypopnea index was 15.8. The apnea-hypopnea index was 22.9/hour overall (52.8 supine, 5 non-supine; 4.5 REM, 0.0 supine REM).  There were 0 respiratory effort-related arousals (RERAs).  The RERA index was 0 events/h. Total respiratory disturbance index (RDI) was 22.9 events/h. RDI results showed: supine RDI  52.8 /h; non-supine RDI 17.9 /h; REM RDI 4.5 /h, supine REM RDI 0.0 /h.   Based on AASM criteria (using a 3% oxygen desaturation and /or arousal rule for scoring hypopneas), there were  45 apneas (39 obstructive; 3 central; 3 mixed), and 127 hypopneas. Apnea index was 7.1. Hypopnea index was 20.0. The apnea-hypopnea index was 27.1 overall (62.8 supine, 10 non-supine; 10.0 REM, 0.0 supine REM).  There were 0 respiratory effort-related arousals (RERAs).  The RERA index was 0 events/h. Total respiratory disturbance index (RDI) was 27.1 events/h. RDI results showed: supine RDI   62.8 /h; non-supine RDI 21.2 /h; REM RDI 10.0 /h, supine REM RDI 0.0 /h.    OXIMETRY: Oxyhemoglobin Saturation Nadir during sleep was at  74%) from a mean of 90%.  Of the Total sleep time (TST)   hypoxemia (=<88%) was present for  94.4 minutes, or 24.8% of total sleep time.  LIMB MOVEMENTS: There were 0 periodic limb movements of sleep (0.0/hr), of which 0 (0.0/hr) were associated with an arousal.   AROUSAL: There were 113 arousals in total, for an arousal index of 18 arousals/hour.  Of these, 46 were identified as respiratory-related arousals (7 /h), 0 were PLM-related arousals (0 /h), and 71 were non-specific arousals (11 /h).   EEG: Review of the EEG showed no abnormal electrical discharges and symmetrical bihemispheric findings.    EKG: The EKG revealed normal sinus rhythm (NSR) with occasional PVCs noted. The average heart rate during sleep was 68 bpm.   AUDIO/VIDEO REVIEW: The audio and video review did not show any abnormal or unusual behaviors, movements, phonations or vocalizations. The patient took 1 restroom break. Snoring was noted, intermittently in the mild range.  POST-STUDY QUESTIONNAIRE: Post study, the patient indicated, that sleep was worse than usual.    IMPRESSION:    1. Moderate obstructive Sleep Apnea (OSA) 2. Nocturnal hypoxemia 3. Dysfunctions associated with sleep stages or arousal from sleep 4. Non-specific abnormal electrocardiogram (EKG)  RECOMMENDATIONS:    1. This study demonstrates moderate to severe obstructive sleep apnea, with a total AHI of 22.9/hour, supine AHI of 52.8/hour and O2 nadir of 74%. Time below 89% saturation was over 90 minutes for the night, indicating nocturnal hypoxemia. Treatment with a positive airway pressure (PAP) device is recommended.  The patient will be advised to proceed with home autoPAP therapy for now.  A laboratory attended, full night PAP-titration study can be considered down the road, to optimize therapy settings, mask fit,  monitoring of tolerance and of proper oxygen saturations. Other treatment options may be limited, and may include (generally speaking) surgical options in selected patients.  2. Please note that untreated obstructive sleep apnea may carry additional perioperative morbidity. Patients with significant obstructive sleep apnea should receive perioperative PAP therapy and the surgeons and particularly the anesthesiologist should be informed of the diagnosis and the severity of the sleep disordered breathing. 3. The study showed occasional PVCs on single lead EKG; clinical correlation is recommended. Of note, the patient is well-know to cardiology.  4. This study shows sleep fragmentation and mildly abnormal sleep stage percentages; these are nonspecific findings and per se do not signify an intrinsic sleep disorder or a cause for the patient's sleep-related symptoms. Causes include (but are not limited to) the first night effect of the sleep study, circadian rhythm disturbances, medication effect or an underlying mood disorder or medical problem.  5. The patient should be cautioned not to drive, work at heights, or operate dangerous or heavy equipment when tired or sleepy. Review and reiteration of good sleep hygiene measures should be pursued with any patient. 6. The patient will be seen in follow-up in the sleep clinic at [facility] for discussion of the  test results, symptom and treatment compliance review, further management strategies, etc. The patient and the referring provider will be notified of the test results. I certify that I have reviewed the entire raw data recording prior to the issuance of this report in accordance with the Standards of Accreditation of the American Academy of Sleep Medicine (AASM).  Debbra Fairy, MD, PhD Medical Director, [facility]) Diplomat, ABPN (Neurology and Sleep)               Technical Report:   General Information   Name: Denaye, Anspach BMI: 40.98 Physician: Debbra Fairy, MD  ID: [id number] Height: 65.5 in Technician: Octavia Belton, RPSGT  Sex: Female Weight: 171.0 lb Record: [id number]  Age: 68 [[birth year]] Date: 04/17/2024    Medical & Medication History    Ms. Reves is a 68 year old female with an underlying medical history of hypertension, hypothyroidism, hyperlipidemia, restless leg syndrome, anxiety, depression, chronic back pain, arthritis, status post bilateral knee replacement surgeries, status post bilateral carpal tunnel surgeries, status post tonsillectomy, and overweight state, who presents for follow-up consultation of her sleep disturbance, concern for obstructive sleep apnea. I first met her at the request of her primary care physician on 07/14/2023, at which time she reported snoring and sleep disruption. She had daytime tiredness. She was advised to proceed with a sleep study. She did not proceed with testing at the time. Today, 03/20/2024: She reports worsening daytime somnolence. Her husband also feels that her snoring has become louder. She has lost weight in the past 6 months. Wounds on Zepbound  but is currently not on it. She reports ongoing symptoms of restless leg syndrome, typically when she is in bed at night trying to fall asleep. She takes Tylenol  PM every night. She goes to bed somewhere between 11 PM and 1 AM. Typical rise time is between 10 AM and noon. She has not tried melatonin. Her husband endorses that she has leg twitching in her sleep. She also has some mumbling with sleep talking at times. She is not sure if she had sleep talking as a child. Her Epworth sleepiness score is 13 out of 24, fatigue severity score is 40 out of 63.  Tylenol , Biotin w Vitamin C & E,Vitaamin D, Voltaren , Lexapro , Flonase, Ginko Biloba, Microzide , XYZal , Crestor , Synthroid , Risaquad   Sleep Disorder      Comments   Patient arrived for a diagnostic polysomnogram. Procedure explained and all  questions answered. Standard paste setup without complications. 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 She is wearing it tonight. Patient slept supine, left, and right. Mild intermittent snoring heard. Respiratory events observed, worse while supine. After 2 hours total sleep time, AHI = 32. Cardiac arrhythmias observed, including PVC's. Patient has a known cardiac history. No significant PLMS observed. One restroom visit.    Lights out: 09:52:28 PM Lights on: 05:03:29 AM   Time Total Supine Side Prone Upright  Recording (TRT) 7h 11.45m 1h 24.77m 5h 46.52m 0h 0.46m 0h 0.68m  Sleep (TST) 6h 20.53m 0h 54.37m 5h 26.62m 0h 0.39m 0h 0.45m   Latency N1 N2 N3 REM Onset Per. Slp. Eff.  Actual 0h 0.19m 0h 16.39m 0h 59.7m 3h 24.5m 0h 19.68m 0h 46.84m 88.28%   Stg Dur Wake N1 N2 N3 REM  Total 50.5 26.5 247.0 41.0 66.0  Supine 30.0 0.0 50.0 4.5 0.0  Side 20.5 26.5 197.0 36.5 66.0  Prone 0.0 0.0 0.0 0.0 0.0  Upright 0.0 0.0 0.0 0.0 0.0  Stg % Wake N1 N2 N3 REM  Total 11.7 7.0 64.9 10.8 17.3  Supine 7.0 0.0 13.1 1.2 0.0  Side 4.8 7.0 51.8 9.6 17.3  Prone 0.0 0.0 0.0 0.0 0.0  Upright 0.0 0.0 0.0 0.0 0.0     Apnea Summary Sub Supine Side Prone Upright  Total 45 Total 45 11 34 0 0    REM 0 0 0 0 0    NREM 45 11 34 0 0  Obs 39 REM 0 0 0 0 0    NREM 39 10 29 0 0  Mix 3 REM 0 0 0 0 0    NREM 3 1 2  0 0  Cen 3 REM 0 0 0 0 0    NREM 3 0 3 0 0   Rera Summary Sub Supine Side Prone Upright  Total 0 Total 0 0 0 0 0    REM 0 0 0 0 0    NREM 0 0 0 0 0   Hypopnea Summary Sub Supine Side Prone Upright  Total 127 Total 127 46 81 0 0    REM 11 0 11 0 0    NREM 116 46 70 0 0   4% Hypopnea Summary Sub Supine Side Prone Upright  Total (4%) 100 Total 100 37 63 0 0    REM 5 0 5 0 0    NREM 95 37 58 0 0     AHI Total Obs Mix Cen  27.12 Apnea 7.10 6.15 0.47 0.47   Hypopnea 20.03 -- -- --  22.86 Hypopnea (4%) 15.77 -- -- --    Total Supine Side Prone Upright  Position AHI 27.12 62.75 21.17 0.00 0.00   REM AHI 10.00   NREM AHI 30.72   Position RDI 27.12 62.75 21.17 0.00 0.00  REM RDI 10.00   NREM RDI 30.72    4% Hypopnea Total Supine Side Prone Upright  Position AHI (4%) 22.86 52.84 17.85 0.00 0.00  REM AHI (4%) 4.55   NREM AHI (4%) 26.71   Position RDI (4%) 22.86 52.84 17.85 0.00 0.00  REM RDI (4%) 4.55   NREM RDI (4%) 26.71    Desaturation Information Threshold: 2% <100% <90% <80% <70% <60% <50% <40%  Supine 88.0 67.0 0.0 0.0 0.0 0.0 0.0  Side 243.0 176.0 0.0 0.0 0.0 0.0 0.0  Prone 0.0 0.0 0.0 0.0 0.0 0.0 0.0  Upright 0.0 0.0 0.0 0.0 0.0 0.0 0.0  Total 331.0 243.0 0.0 0.0 0.0 0.0 0.0  Index 48.3 35.4 0.0 0.0 0.0 0.0 0.0   Threshold: 3% <100% <90% <80% <70% <60% <50% <40%  Supine 71.0 58.0 0.0 0.0 0.0 0.0 0.0  Side 162.0 130.0 0.0 0.0 0.0 0.0 0.0  Prone 0.0 0.0 0.0 0.0 0.0 0.0 0.0  Upright 0.0 0.0 0.0 0.0 0.0 0.0 0.0  Total 233.0 188.0 0.0 0.0 0.0 0.0 0.0  Index 34.0 27.4 0.0 0.0 0.0 0.0 0.0   Threshold: 4% <100% <90% <80% <70% <60% <50% <40%  Supine 54.0 45.0 0.0 0.0 0.0 0.0 0.0  Side 119.0 103.0 0.0 0.0 0.0 0.0 0.0  Prone 0.0 0.0 0.0 0.0 0.0 0.0 0.0  Upright 0.0 0.0 0.0 0.0 0.0 0.0 0.0  Total 173.0 148.0 0.0 0.0 0.0 0.0 0.0  Index 25.2 21.6 0.0 0.0 0.0 0.0 0.0   Threshold: 3% <100% <90% <80% <70% <60% <50% <40%  Supine 71 58 0 0 0 0 0  Side 162 130 0 0 0 0 0  Prone 0  0 0 0 0 0 0  Upright 0 0 0 0 0 0 0  Total 233 188 0 0 0 0 0   Awakening/Arousal Information # of Awakenings 15  Wake after sleep onset 31.33m  Wake after persistent sleep 19.73m   Arousal Assoc. Arousals Index  Apneas 21 3.3  Hypopneas 25 3.9  Leg Movements 3 0.5  Snore 0 0.0  PTT Arousals 0 0.0  Spontaneous 71 11.2  Total 119 18.8  Leg Movement Information PLMS LMs Index  Total LMs during PLMS 0 0.0  LMs w/ Microarousals 0 0.0   LM LMs Index  w/ Microarousal 3 0.5  w/ Awakening 0 0.0  w/ Resp Event 1 0.2  Spontaneous 7 1.1  Total 11 1.7     Desaturation threshold setting:  3% Minimum desaturation setting: 10 seconds SaO2 nadir: 56% The longest event was a 35 sec obstructive Apnea with a minimum SaO2 of 86%. The lowest SaO2 was 74% associated with a 19 sec obstructive Apnea. EKG Rates EKG Avg Max Min  Awake 68 87 54  Asleep 68 90 55  EKG Events: N/A

## 2024-04-25 ENCOUNTER — Telehealth: Payer: Self-pay | Admitting: Neurology

## 2024-04-25 NOTE — Telephone Encounter (Signed)
   Debbra Fairy, MD to [facility] Results    04/24/24  6:23 PM Result Note Patient referred by PCP, seen by me on 03/20/24, diagnostic PSG on 04/17/24.   Please call and notify the patient that the recent sleep study showed moderate to severe obstructive sleep apnea. I recommend treatment for in the form of autoPAP. We may consider at a CPAP titration study at a later date, if need be, which means, that we would ask her to come back in for a second sleep study with CPAP treatment. For now, I would like to start her on a so-called autoPAP machine at home, through a DME company (of her choice, or as per insurance requirement). The DME representative will educate her on how to use the machine, how to put the mask on, etc. I have placed an order in the chart. Please send referral, talk to patient, send report to referring MD. We will need a FU in sleep clinic for 10 weeks post-PAP set up, please arrange that with me or one of our NPs. Thanks, Debbra Fairy, MD, PhD [facility] ([facility]) Nocturnal polysomnography

## 2024-04-25 NOTE — Telephone Encounter (Signed)
 Ok we have her on our list to call.

## 2024-04-25 NOTE — Telephone Encounter (Signed)
 I spoke with the patient and discussed her sleep study results.  The patient verbalized understanding and is amenable to proceeding with AutoPap therapy.  She had her husband listening in the background of the call.  I answered all of their questions during the call.  The patient would like to be referred to [facility] care in [place], [place] .  She will watch for a call within 1 week.  We also discussed the insurance compliance requirements. Patient scheduled initial follow-up appointment for July 17 at 9:45 AM arrival 930.  She would like a copy of the sleep study results mailed to her as well.   Request sent to medical records. Sleep study report sent to referring provider. Referral faxed to [facility]. Received a receipt of confirmation.

## 2024-04-25 NOTE — Telephone Encounter (Signed)
 Pt is asking for a call with an explanation of results to sleep study

## 2024-04-26 ENCOUNTER — Encounter: Payer: Self-pay | Admitting: Internal Medicine

## 2024-04-26 ENCOUNTER — Ambulatory Visit: Admitting: Internal Medicine

## 2024-04-26 VITALS — BP 118/62 | HR 69 | Temp 97.7°F | Ht 65.5 in | Wt 176.0 lb

## 2024-04-26 DIAGNOSIS — R7303 Prediabetes: Secondary | ICD-10-CM

## 2024-04-26 DIAGNOSIS — G2581 Restless legs syndrome: Secondary | ICD-10-CM

## 2024-04-26 DIAGNOSIS — I700 Atherosclerosis of aorta: Secondary | ICD-10-CM | POA: Diagnosis not present

## 2024-04-26 DIAGNOSIS — E039 Hypothyroidism, unspecified: Secondary | ICD-10-CM

## 2024-04-26 DIAGNOSIS — E876 Hypokalemia: Secondary | ICD-10-CM

## 2024-04-26 DIAGNOSIS — Z8719 Personal history of other diseases of the digestive system: Secondary | ICD-10-CM

## 2024-04-26 DIAGNOSIS — G4733 Obstructive sleep apnea (adult) (pediatric): Secondary | ICD-10-CM | POA: Diagnosis not present

## 2024-04-26 DIAGNOSIS — E781 Pure hyperglyceridemia: Secondary | ICD-10-CM

## 2024-04-26 DIAGNOSIS — I712 Thoracic aortic aneurysm, without rupture, unspecified: Secondary | ICD-10-CM

## 2024-04-26 LAB — CBC WITH DIFFERENTIAL/PLATELET
Basophils Absolute: 0 10*3/uL (ref 0.0–0.1)
Basophils Relative: 0.6 % (ref 0.0–3.0)
Eosinophils Absolute: 0.4 10*3/uL (ref 0.0–0.7)
Eosinophils Relative: 6.4 % — ABNORMAL HIGH (ref 0.0–5.0)
HCT: 37.2 % (ref 36.0–46.0)
Hemoglobin: 12.8 g/dL (ref 12.0–15.0)
Lymphocytes Relative: 37.4 % (ref 12.0–46.0)
Lymphs Abs: 2.1 10*3/uL (ref 0.7–4.0)
MCHC: 34.5 g/dL (ref 30.0–36.0)
MCV: 90.2 fl (ref 78.0–100.0)
Monocytes Absolute: 0.6 10*3/uL (ref 0.1–1.0)
Monocytes Relative: 10.2 % (ref 3.0–12.0)
Neutro Abs: 2.6 10*3/uL (ref 1.4–7.7)
Neutrophils Relative %: 45.4 % (ref 43.0–77.0)
Platelets: 219 10*3/uL (ref 150.0–400.0)
RBC: 4.12 Mil/uL (ref 3.87–5.11)
RDW: 13 % (ref 11.5–15.5)
WBC: 5.7 10*3/uL (ref 4.0–10.5)

## 2024-04-26 LAB — COMPREHENSIVE METABOLIC PANEL WITH GFR
ALT: 16 U/L (ref 0–35)
AST: 17 U/L (ref 0–37)
Albumin: 4.2 g/dL (ref 3.5–5.2)
Alkaline Phosphatase: 37 U/L — ABNORMAL LOW (ref 39–117)
BUN: 17 mg/dL (ref 6–23)
CO2: 29 meq/L (ref 19–32)
Calcium: 9.4 mg/dL (ref 8.4–10.5)
Chloride: 105 meq/L (ref 96–112)
Creatinine, Ser: 0.72 mg/dL (ref 0.40–1.20)
GFR: 84.96 mL/min (ref >60.00)
Glucose, Bld: 97 mg/dL (ref 70–99)
Potassium: 3.7 meq/L (ref 3.5–5.1)
Sodium: 142 meq/L (ref 135–145)
Total Bilirubin: 0.5 mg/dL (ref 0.2–1.2)
Total Protein: 6.9 g/dL (ref 6.0–8.3)

## 2024-04-26 LAB — LIPID PANEL
Cholesterol: 161 mg/dL (ref 0–200)
HDL: 62.7 mg/dL (ref >39.00)
LDL Cholesterol: 66 mg/dL (ref 0–99)
NonHDL: 98.69
Total CHOL/HDL Ratio: 3
Triglycerides: 164 mg/dL — ABNORMAL HIGH (ref 0.0–149.0)
VLDL: 32.8 mg/dL (ref 0.0–40.0)

## 2024-04-26 LAB — HEMOGLOBIN A1C: Hgb A1c MFr Bld: 5.9 % (ref 4.6–6.5)

## 2024-04-26 LAB — FERRITIN: Ferritin: 85.2 ng/mL (ref 10.0–291.0)

## 2024-04-26 MED ORDER — TIRZEPATIDE-WEIGHT MANAGEMENT 2.5 MG/0.5ML ~~LOC~~ SOAJ: 2.5000 mg | SUBCUTANEOUS | 11 refills | Status: DC

## 2024-04-26 MED ORDER — ICOSAPENT ETHYL 1 G PO CAPS: 2.0000 g | ORAL_CAPSULE | Freq: Two times a day (BID) | ORAL | 3 refills | Status: AC

## 2024-04-26 NOTE — Patient Instructions (Addendum)
 Potassium Content of Foods  Potassium is a mineral found in many foods and drinks. It can affect how the heart works, affect blood pressure, and keep fluids and electrolytes balanced in the body. It is important not to have too much potassium (hyperkalemia) or too little potassium (hypokalemia) in the body, especially in the blood. Potassium is naturally found in many different types of whole foods, such as fruits, vegetables, meat, and dairy products. Processed foods tend to be lower in potassium. The amount of potassium you need each day depends on your age and any medical conditions you may have. General recommendations are: Females aged 35 and older: 2,600 mg per day. Males aged 72 and older: 3,400 mg per day. Talk with your health care provider or dietitian about how much potassium you need. What foods are high in potassium? Below are examples of foods that have greater than 200 mg of potassium per serving. Fruits Orange -- 1 medium (130 g) has 230 mg of potassium. Banana -- 1 medium (120 g) has 420 mg of potassium. Cantaloupe, chunks -- 1 cup (160 g) has 430 mg of potassium. Vegetables Potato, baked, without skin -- 1 medium (170 g) has 600 mg of potassium. Broccoli, chopped, cooked --  cup (77.5 g) has 230 mg of potassium. Tomato, chopped or sliced -- 1 cup (152 g) has 400 mg of potassium. Grains Cereal, bran with raisins -- 1 cup (59 g) has 360 mg of potassium. Granola with almonds --  cup (82 g) has 220 mg of potassium. Meats and other proteins Ground beef patty -- 4 ounces (113 g) has 240 mg of potassium. Kidney beans, boiled --  cup (130 g) has 350 mg of potassium. Almonds -- 1 ounce (approximately 22 nuts or 28 g) has 200 mg of potassium. Dairy Cow's milk, 1% -- 1 cup (237 mL) has 360 mg of potassium. Plain vanilla low-fat yogurt --  cup (184 g) has 220 mg of potassium. The items listed above may not be a complete list of foods high in potassium. Actual amounts of  potassium may be different depending on ripeness, shelf life, and food preparation. Contact a dietitian for more information. What foods are low in potassium? Below are examples of foods that have less than 200 mg of potassium per serving. Fruits Blueberries -- 1 cup (145 g) has 110 mg of potassium. Apple -- 1 medium (140 g) has 145 mg of potassium. Grapes -- 1 cup (160 g) has 175 mg of potassium. Vegetables Cabbage, raw -- 1 cup (70 g) has 120 mg of potassium. Cauliflower, chopped, cooked -- 1 cup (180 g) has 90 mg of potassium. Romaine lettuce, chopped -- 1 cup (56 g) has 120 mg of potassium. Grains Bagel, plain -- one 4-inch (10 cm) has 100 mg of potassium. Whole wheat bread -- 1 slice (26 g) has 70 mg of potassium. White rice, cooked -- 1 cup (163 g) has 50 mg of potassium. Meats and other proteins Tuna, light, canned in water  -- 3 ounces (85 g) has 150 mg of potassium. Egg, fried -- 1 large (50 g) has 60 mg of potassium. Peanuts --1 ounce (35 nuts or 28 g) has 180 mg of potassium. Tofu --  cup (252 g) has 150 mg of potassium. Dairy Cheese (cheddar, colby, mozzarella, or provolone) -- 1 ounce (28 g) has 30 to 40 mg of potassium. The items listed above may not be a complete list of foods that are low in potassium. Actual amounts  of potassium may be different depending on ripeness, shelf life, and food preparation. Contact a dietitian for more information. Summary Potassium is a mineral found in many foods and drinks. It affects how the heart works, affects blood pressure, and keeps fluids and electrolytes balanced in the body. The amount of potassium you need each day depends on your age and any existing medical conditions you may have. Your health care provider or dietitian may recommend an amount of potassium that you should have each day. This information is not intended to replace advice given to you by your health care provider. Make sure you discuss any questions you have with  your health care provider. Document Revised: 09/15/2021 Document Reviewed: 08/27/2021 Elsevier Patient Education  2024 Elsevier Inc.      ?? Trans Fats: What You Need to Know (and How to Avoid Them) Protect Your Heart, Brain, and Overall Health  ? What Are Trans Fats? Trans fats are a type of unhealthy fat that can increase your risk of: Heart disease Stroke Type 2 diabetes Inflammation Memory problems They are artificially made through a process called hydrogenation and were once common in processed foods for better shelf life and texture.  ?? Why Should I Avoid Trans Fats? Even small amounts of trans fats can: Raise "bad" LDL cholesterol Lower "good" HDL cholesterol Cause inflammation in your blood vessels Increase your risk of heart attack or stroke There is no safe level of artificial trans fat.  ?? How to Spot Trans Fats (Even When the Label Says "0g") Food companies can legally say "0 grams trans fat" if the product contains less than 0.5 grams per serving -- but that can add up fast! Look at the ingredients list for these clues: ?? Partially hydrogenated oil ? this means trans fat is present. ? Avoid foods with "shortening" or "hydrogenated" oils.  ?? Common Foods That May Contain Trans Fats Even today, you may find trans fats in: Baked goods (cookies, cakes, pies) Microwave popcorn Crackers Margarine and shortening Fried fast foods Frozen pizza  ? Healthier Choices Choose products with 0g trans fat and no "partially hydrogenated oil" in the ingredients. Use olive oil, avocado oil, or canola oil for cooking. Eat more whole, unprocessed foods: fruits, vegetables, whole grains, and lean proteins. Choose baked over fried, and fresh over packaged.  ?? Takeaway Message Trans fats are harmful, even in small amounts. To protect your health: Read labels carefully. Look beyond "0g trans fat" and scan for "partially hydrogenated oils." Choose whole foods and  heart-healthy fats.

## 2024-04-26 NOTE — Progress Notes (Signed)
 ==============================   [facility]: [phone]   -- Medical Office Visit --  Patient: Martha Orr      Age: 68 y.o.       Sex:  female  Date:   04/26/2024 Today's Healthcare Provider: Anthon Kins, MD  ==============================   Chief Complaint: Hypertension and Insomnia  History of Present Illness 68 year old female with obstructive sleep apnea who presents for follow-up and management of her condition.  She is awaiting the arrival of a CPAP machine for treatment of her obstructive sleep apnea, which was confirmed by a sleep study. No chest pain. She experiences allergy issues.  She has a history of elevated triglycerides and is taking omega-3 supplements twice a day. She is also on Crestor  for cholesterol management. She has a history of prediabetes and is concerned about her weight gain since stopping certain medications.  She manages her symptoms with Tylenol  or Aleve  and has not engaged in physical therapy. She has a history of low potassium levels and is not on a high potassium diet. She is not taking potassium supplements and is unsure about her dietary intake of potassium-rich foods.  She experiences occasional tremors. She has not been diagnosed with diabetes but has had borderline high blood sugar levels in the past. She is concerned about her blood sugar levels and has requested testing for diabetes.  She has a history of a lung nodule, which is due for follow-up in August. She also has a history of bleeding attributed to hemorrhoids, with no recent flares.  Background Reviewed: Problem List: has Bilateral primary osteoarthritis of knee; Major depressive disorder, single episode, in remission (HCC); Anxiety; Dyslipidemia; Hypothyroidism; H/O cold sores; Osteoarthritis; Restless legs; Pulmonary nodules; Otalgia of both ears; Hypertension; Hyperglycemia; Hematuria; Thoracic aortic aneurysm (HCC); Allergic rhinitis;  Nausea; Hepatic steatosis; Cough, persistent; Gastroesophageal reflux disease without esophagitis; Globus pharyngeus; Lumbar arthropathy; Occult blood in stools; Hiatal hernia; History of colon polyps; Hemorrhoid; Obesity (BMI 30-39.9); Snoring; Hand pain, left; and Atherosclerosis of aorta (HCC) on their problem list. Medications:  has a current medication list which includes the following prescription(s): acetaminophen , biotin w/ vitamins c & e, cholecalciferol, escitalopram , fluticasone, ginkgo biloba extract, hydrochlorothiazide , icosapent  ethyl, levothyroxine , rosuvastatin , tirzepatide , diclofenac  sodium, hydrochlorothiazide , and levocetirizine, and the following Facility-Administered Medications: acidophilus.  Allergies:  has no known allergies.  Past Medical History:  has a past medical history of Abdominal pain, chronic, right lower quadrant (09/27/2022), Anxiety, Arthritis, Chronic back pain, Depression, Family history of adverse reaction to anesthesia, Gallstones, Hepatic steatosis (10/04/2022), High cholesterol, History of appendectomy (10/04/2022), History of cholecystectomy (10/04/2022), History of colon polyps, History of hysterectomy (10/04/2022), History of shingles, Hypertension, Hypothyroidism, Joint pain, Joint swelling, Microscopic hematuria, and Nausea (09/27/2022). Past Surgical History:   has a past surgical history that includes Appendectomy; Carpal tunnel release (Bilateral); Tonsillectomy; Bunionectomy (Bilateral); Knee arthroscopy (Right); Cholecystectomy; Nasal sinus surgery; Colonoscopy; knot removed from right breast; Total knee arthroplasty (Bilateral, 10/12/2016); Knee arthroscopy (Left, 11/01/2017); Breast biopsy; Tonsillectomy and adenoidectomy (2002); Abdominal hysterectomy (1998); and laparoscopy (N/A, 04/15/2023). Social History:   reports that she has never smoked. She has never used smokeless tobacco. She reports current alcohol use. She reports that she does not use  drugs. Family History:  family history includes Arthritis in her father, sister, sister, and sister; Cancer in her brother and brother; Depression in her brother; Heart attack in her brother, brother, mother, and sister; Heart disease in her brother; Hyperlipidemia in her brother, mother, and  sister; Hypertension in her brother; Sleep apnea in her brother; Stroke in her sister. Depression Screen and Health Maintenance:    04/26/2024    8:08 AM 11/22/2023    8:27 AM 08/19/2023    8:23 AM 02/11/2023    8:00 AM  PHQ 2/9 Scores  PHQ - 2 Score 0 0 0 0  PHQ- 9 Score 0 0  0    Medication Reconciliation: Current Outpatient Medications on File Prior to Visit  Medication Sig   acetaminophen  (TYLENOL ) 325 MG tablet Take 325 mg by mouth every 6 (six) hours as needed for moderate pain.   Biotin w/ Vitamins C & E (HAIR/SKIN/NAILS PO) Take 1 capsule by mouth daily.   cholecalciferol (VITAMIN D ) 1000 units tablet Take 1,000 Units by mouth at bedtime.   escitalopram  (LEXAPRO ) 20 MG tablet TAKE 1 TABLET DAILY   fluticasone (FLONASE) 50 MCG/ACT nasal spray Place 2 sprays into both nostrils daily as needed for allergies.   Ginkgo Biloba Extract 60 MG CAPS Take 1 capsule by mouth daily.   hydrochlorothiazide  (MICROZIDE ) 12.5 MG capsule Take 1 capsule (12.5 mg total) by mouth daily.   levothyroxine  (SYNTHROID ) 112 MCG tablet Take 1 tablet (112 mcg total) by mouth at bedtime.   rosuvastatin  (CRESTOR ) 40 MG tablet Take 1 tablet (40 mg total) by mouth at bedtime.   diclofenac  Sodium (VOLTAREN ) 1 % GEL Apply 4 g topically 4 (four) times daily as needed. (Patient not taking: Reported on 04/26/2024)   hydrochlorothiazide  (MICROZIDE ) 12.5 MG capsule Take 1 capsule (12.5 mg total) by mouth daily.   levocetirizine (XYZAL ) 5 MG tablet Take 1 tablet (5 mg total) by mouth every evening.   Current Facility-Administered Medications on File Prior to Visit  Medication   acidophilus (RISAQUAD) capsule 1 capsule  There are no  discontinued medications.   Physical Exam:    04/26/2024    8:02 AM 03/20/2024    2:06 PM 11/22/2023    8:11 AM  Vitals with BMI  Height 5' 5.5" 5' 5.5" 5' 6.5"  Weight 176 lbs 171 lbs 13 oz 167 lbs 3 oz  BMI 28.83 28.14 26.59  Systolic 118 109 161  Diastolic 62 75 76  Pulse 69 94 75  Vital signs reviewed.  Nursing notes reviewed. Weight trend reviewed. Physical Exam  Physical Exam NEUROLOGICAL: Cranial nerves grossly intact. Moves all extremities without gross motor or sensory deficit. Hand steady, no tremors. General Appearance:  No acute distress appreciable.   Well-groomed, healthy-appearing female.  Well proportioned with no abnormal fat distribution.  Good muscle tone. Pulmonary:  Normal work of breathing at rest, no respiratory distress apparent. SpO2: 98 %  Musculoskeletal: All extremities are intact.  Neurological:  Awake, alert, oriented, and engaged.  No obvious focal neurological deficits or cognitive impairments.  Sensorium seems unclouded.   Speech is clear and coherent with logical content. Psychiatric:  Appropriate mood, pleasant and cooperative demeanor, thoughtful and engaged during the exam     Results for orders placed or performed in visit on 04/26/24  Lipid panel  Result Value Ref Range   Cholesterol 161 0 - 200 mg/dL   Triglycerides 096.0 (H) 0.0 - 149.0 mg/dL   HDL 45.40 >98.11 mg/dL   VLDL 91.4 0.0 - 78.2 mg/dL   LDL Cholesterol 66 0 - 99 mg/dL   Total CHOL/HDL Ratio 3    NonHDL 98.69   Comprehensive metabolic panel with GFR  Result Value Ref Range   Sodium 142 135 - 145 mEq/L  Potassium 3.7 3.5 - 5.1 mEq/L   Chloride 105 96 - 112 mEq/L   CO2 29 19 - 32 mEq/L   Glucose, Bld 97 70 - 99 mg/dL   BUN 17 6 - 23 mg/dL   Creatinine, Ser 6.16 0.40 - 1.20 mg/dL   Total Bilirubin 0.5 0.2 - 1.2 mg/dL   Alkaline Phosphatase 37 (L) 39 - 117 U/L   AST 17 0 - 37 U/L   ALT 16 0 - 35 U/L   Total Protein 6.9 6.0 - 8.3 g/dL   Albumin 4.2 3.5 - 5.2 g/dL   GFR  07.37 >10.62 mL/min   Calcium  9.4 8.4 - 10.5 mg/dL  CBC with Differential/Platelet  Result Value Ref Range   WBC 5.7 4.0 - 10.5 K/uL   RBC 4.12 3.87 - 5.11 Mil/uL   Hemoglobin 12.8 12.0 - 15.0 g/dL   HCT 69.4 85.4 - 62.7 %   MCV 90.2 78.0 - 100.0 fl   MCHC 34.5 30.0 - 36.0 g/dL   RDW 03.5 00.9 - 38.1 %   Platelets 219.0 150.0 - 400.0 K/uL   Neutrophils Relative % 45.4 43.0 - 77.0 %   Lymphocytes Relative 37.4 12.0 - 46.0 %   Monocytes Relative 10.2 3.0 - 12.0 %   Eosinophils Relative 6.4 (H) 0.0 - 5.0 %   Basophils Relative 0.6 0.0 - 3.0 %   Neutro Abs 2.6 1.4 - 7.7 K/uL   Lymphs Abs 2.1 0.7 - 4.0 K/uL   Monocytes Absolute 0.6 0.1 - 1.0 K/uL   Eosinophils Absolute 0.4 0.0 - 0.7 K/uL   Basophils Absolute 0.0 0.0 - 0.1 K/uL  TSH Rfx on Abnormal to Free T4  Result Value Ref Range   TSH 0.105 (L) 0.450 - 4.500 uIU/mL  HgB A1c  Result Value Ref Range   Hgb A1c MFr Bld 5.9 4.6 - 6.5 %  Ferritin  Result Value Ref Range   Ferritin 85.2 10.0 - 291.0 ng/mL  T4F  Result Value Ref Range   T4,Free (Direct) 1.34 0.82 - 1.77 ng/dL   Office Visit on 82/99/3716  Component Date Value   Cholesterol 04/26/2024 161    Triglycerides 04/26/2024 164.0 (H)    HDL 04/26/2024 62.70    VLDL 04/26/2024 32.8    LDL Cholesterol 04/26/2024 66    Total CHOL/HDL Ratio 04/26/2024 3    NonHDL 04/26/2024 98.69    Sodium 04/26/2024 142    Potassium 04/26/2024 3.7    Chloride 04/26/2024 105    CO2 04/26/2024 29    Glucose, Bld 04/26/2024 97    BUN 04/26/2024 17    Creatinine, Ser 04/26/2024 0.72    Total Bilirubin 04/26/2024 0.5    Alkaline Phosphatase 04/26/2024 37 (L)    AST 04/26/2024 17    ALT 04/26/2024 16    Total Protein 04/26/2024 6.9    Albumin 04/26/2024 4.2    GFR 04/26/2024 84.96    Calcium  04/26/2024 9.4    WBC 04/26/2024 5.7    RBC 04/26/2024 4.12    Hemoglobin 04/26/2024 12.8    HCT 04/26/2024 37.2    MCV 04/26/2024 90.2    MCHC 04/26/2024 34.5    RDW 04/26/2024 13.0     Platelets 04/26/2024 219.0    Neutrophils Relative % 04/26/2024 45.4    Lymphocytes Relative 04/26/2024 37.4    Monocytes Relative 04/26/2024 10.2    Eosinophils Relative 04/26/2024 6.4 (H)    Basophils Relative 04/26/2024 0.6    Neutro Abs 04/26/2024 2.6    Lymphs  Abs 04/26/2024 2.1    Monocytes Absolute 04/26/2024 0.6    Eosinophils Absolute 04/26/2024 0.4    Basophils Absolute 04/26/2024 0.0    TSH 04/26/2024 0.105 (L)    Hgb A1c MFr Bld 04/26/2024 5.9    Ferritin 04/26/2024 85.2    T4,Free (Direct) 04/26/2024 1.34   Office Visit on 11/22/2023  Component Date Value   Rheumatoid fact SerPl-aC* 11/22/2023 11    Cyclic Citrullin Peptide* 11/22/2023 <16    Cholesterol 11/22/2023 133    Triglycerides 11/22/2023 174.0 (H)    HDL 11/22/2023 52.90    VLDL 11/22/2023 34.8    LDL Cholesterol 11/22/2023 45    Total CHOL/HDL Ratio 11/22/2023 3    NonHDL 11/22/2023 80.12    Sodium 11/22/2023 140    Potassium 11/22/2023 3.3 (L)    Chloride 11/22/2023 100    CO2 11/22/2023 32    Glucose, Bld 11/22/2023 90    BUN 11/22/2023 15    Creatinine, Ser 11/22/2023 0.75    Total Bilirubin 11/22/2023 0.5    Alkaline Phosphatase 11/22/2023 34 (L)    AST 11/22/2023 20    ALT 11/22/2023 20    Total Protein 11/22/2023 7.0    Albumin 11/22/2023 4.3    GFR 11/22/2023 81.14    Calcium  11/22/2023 9.4    WBC 11/22/2023 6.0    RBC 11/22/2023 4.33    Hemoglobin 11/22/2023 13.2    HCT 11/22/2023 38.3    MCV 11/22/2023 88.4    MCHC 11/22/2023 34.4    RDW 11/22/2023 13.5    Platelets 11/22/2023 268.0    Neutrophils Relative % 11/22/2023 53.7    Lymphocytes Relative 11/22/2023 31.5    Monocytes Relative 11/22/2023 9.5    Eosinophils Relative 11/22/2023 4.7    Basophils Relative 11/22/2023 0.6    Neutro Abs 11/22/2023 3.2    Lymphs Abs 11/22/2023 1.9    Monocytes Absolute 11/22/2023 0.6    Eosinophils Absolute 11/22/2023 0.3    Basophils Absolute 11/22/2023 0.0    Color, Urine 11/22/2023 YELLOW     APPearance 11/22/2023 Sl Cloudy (A)    Specific Gravity, Urine 11/22/2023 >=1.030 (A)    pH 11/22/2023 6.0    Total Protein, Urine 11/22/2023 NEGATIVE    Urine Glucose 11/22/2023 NEGATIVE    Ketones, ur 11/22/2023 NEGATIVE    Bilirubin Urine 11/22/2023 NEGATIVE    Hgb urine dipstick 11/22/2023 SMALL (A)    Urobilinogen, UA 11/22/2023 0.2    Leukocytes,Ua 11/22/2023 TRACE (A)    Nitrite 11/22/2023 NEGATIVE    WBC, UA 11/22/2023 11-20/hpf (A)    RBC / HPF 11/22/2023 3-6/hpf (A)    Mucus, UA 11/22/2023 Presence of (A)    Squamous Epithelial / HPF 11/22/2023 Rare(0-4/hpf)    Bacteria, UA 11/22/2023 Rare(<10/hpf) (A)    Amorphous 11/22/2023 Present (A)   Office Visit on 09/20/2023  Component Date Value   Cholesterol, Total 09/20/2023 147    Triglycerides 09/20/2023 194 (H)    HDL 09/20/2023 61    VLDL Cholesterol Cal 09/20/2023 31    LDL Chol Calc (NIH) 09/20/2023 55    Chol/HDL Ratio 09/20/2023 2.4    Lipoprotein (a) 09/20/2023 <8.4   Office Visit on 05/30/2023  Component Date Value   Sodium 05/30/2023 141    Potassium 05/30/2023 3.2 (L)    Chloride 05/30/2023 98    CO2 05/30/2023 34 (H)    Glucose, Bld 05/30/2023 119 (H)    BUN 05/30/2023 13    Creatinine, Ser 05/30/2023 0.80    Total Bilirubin  05/30/2023 0.3    Alkaline Phosphatase 05/30/2023 44    AST 05/30/2023 22    ALT 05/30/2023 24    Total Protein 05/30/2023 7.1    Albumin 05/30/2023 4.3    GFR 05/30/2023 75.35    Calcium  05/30/2023 9.7    WBC 05/30/2023 5.9    RBC 05/30/2023 4.26    Hemoglobin 05/30/2023 12.9    HCT 05/30/2023 38.1    MCV 05/30/2023 89.3    MCHC 05/30/2023 34.0    RDW 05/30/2023 12.9    Platelets 05/30/2023 245.0    Neutrophils Relative % 05/30/2023 41.7 (L)    Lymphocytes Relative 05/30/2023 40.8    Monocytes Relative 05/30/2023 11.6    Eosinophils Relative 05/30/2023 5.2 (H)    Basophils Relative 05/30/2023 0.7    Neutro Abs 05/30/2023 2.5    Lymphs Abs 05/30/2023 2.4    Monocytes  Absolute 05/30/2023 0.7    Eosinophils Absolute 05/30/2023 0.3    Basophils Absolute 05/30/2023 0.0    TSH 05/30/2023 0.965   No image results found. Nocturnal polysomnography Result Date: 04/17/2024 Debbra Fairy, MD     04/24/2024  6:21 PM Physician Interpretation:  [facility] POLYSOMNOGRAPHY  INTERPRETATION REPORT STUDY DATE:  04/17/2024  PATIENT NAME:  Keiyana Stufflebeam        DATE OF BIRTH:  Mar 11, 1956 PATIENT ID:  [id number]    TYPE OF STUDY:  PSG READING PHYSICIAN: Debbra Fairy, MD, PhD   SCORING TECHNICIAN: Octavia Belton, RPSGT  Referred by: Anthon Kins, MD ? History and Indication for Testing: 68 year old female with an underlying medical history of hypertension, hypothyroidism, hyperlipidemia, restless leg syndrome, anxiety, depression, chronic back pain, arthritis, status post bilateral knee replacement surgeries, status post bilateral carpal tunnel surgeries, status post tonsillectomy, and overweight state, who reports snoring and excessive daytime somnolence. Patient used a bite guard for this study. Her Epworth sleepiness score is 13 out of 24, fatigue severity score is 40 out of 63.  Height: 66 in Weight: 171 lb (BMI 28) Neck Size: 15 in  MEDICATIONS: Tylenol , Biotin w Vitamin C & E,Vitaamin D, Voltaren , Lexapro , Flonase, Ginko Biloba, Microzide , XYZal , Crestor , Synthroid , Risaquad  TECHNICAL DESCRIPTION: A registered sleep technologist was in attendance for the duration of the recording.  Data collection, scoring, video monitoring, and reporting were performed in compliance with the AASM Manual for the Scoring of Sleep and Associated Events; (Hypopnea is scored based on the criteria listed in Section VIII D. 1b in the AASM Manual V2.6 using a 4% oxygen desaturation rule or Hypopnea is scored based on the criteria listed in Section VIII D. 1a in the AASM Manual V2.6 using 3% oxygen desaturation and /or arousal rule). SLEEP CONTINUITY AND SLEEP ARCHITECTURE:   Lights-out was at 21:52: and lights-on at  05:03:, with a total recording time of 7 hours, 11 min. Total sleep time ( TST) was 380.5 minutes with a normal sleep efficiency at 88.3%. There was  17.3% REM sleep. BODY POSITION:  TST was divided  between the following sleep positions: 14.3% supine;  85.7% lateral;  0% prone. Duration of total sleep and percent of total sleep in their respective position is as follows: supine 54 minutes (14%), non-supine 326 minutes (86%); right 77 minutes (20%), left 248 minutes (65%), and prone 00 minutes (0%). Total supine REM sleep time was 00 minutes (0% of total REM sleep).  Sleep latency was normal at 19.5 minutes.  REM sleep latency was increased at 204.0 minutes. Of the total  sleep time, the percentage of stage N1 sleep was 7.0%, stage N2 sleep was 65%, which is mildly increased, stage N3 sleep was 10.8%, which is mildly reduced, and REM sleep was 17.3%, which is mildly reduced. Wake after sleep onset (WASO) time accounted for 31 minutes with minimal to mild sleep fragmentation noted. RESPIRATORY MONITORING:  Based on CMS criteria (using a 4% oxygen desaturation rule for scoring hypopneas), there were 45 apneas (39 obstructive; 3 central; 3 mixed), and 100 hypopneas.  Apnea index was 7.1. Hypopnea index was 15.8. The apnea-hypopnea index was 22.9/hour overall (52.8 supine, 5 non-supine; 4.5 REM, 0.0 supine REM).  There were 0 respiratory effort-related arousals (RERAs).  The RERA index was 0 events/h. Total respiratory disturbance index (RDI) was 22.9 events/h. RDI results showed: supine RDI  52.8 /h; non-supine RDI 17.9 /h; REM RDI 4.5 /h, supine REM RDI 0.0 /h. Based on AASM criteria (using a 3% oxygen desaturation and /or arousal rule for scoring hypopneas), there were 45 apneas (39 obstructive; 3 central; 3 mixed), and 127 hypopneas. Apnea index was 7.1. Hypopnea index was 20.0. The apnea-hypopnea index was 27.1 overall (62.8 supine, 10 non-supine; 10.0 REM, 0.0 supine REM).   There were 0 respiratory effort-related arousals (RERAs).  The RERA index was 0 events/h. Total respiratory disturbance index (RDI) was 27.1 events/h. RDI results showed: supine RDI  62.8 /h; non-supine RDI 21.2 /h; REM RDI 10.0 /h, supine REM RDI 0.0 /h.  OXIMETRY: Oxyhemoglobin Saturation Nadir during sleep was at  74%) from a mean of 90%.  Of the Total sleep time (TST)   hypoxemia (=<88%) was present for  94.4 minutes, or 24.8% of total sleep time. LIMB MOVEMENTS: There were 0 periodic limb movements of sleep (0.0/hr), of which 0 (0.0/hr) were associated with an arousal.  AROUSAL: There were 113 arousals in total, for an arousal index of 18 arousals/hour.  Of these, 46 were identified as respiratory-related arousals (7 /h), 0 were PLM-related arousals (0 /h), and 71 were non-specific arousals (11 /h).  EEG: Review of the EEG showed no abnormal electrical discharges and symmetrical bihemispheric findings.  EKG: The EKG revealed normal sinus rhythm (NSR) with occasional PVCs noted. The average heart rate during sleep was 68 bpm. AUDIO/VIDEO REVIEW: The audio and video review did not show any abnormal or unusual behaviors, movements, phonations or vocalizations. The patient took 1 restroom break. Snoring was noted, intermittently in the mild range. POST-STUDY QUESTIONNAIRE: Post study, the patient indicated, that sleep was worse than usual.  IMPRESSION:  1. Moderate obstructive Sleep Apnea (OSA) 2. Nocturnal hypoxemia 3. Dysfunctions associated with sleep stages or arousal from sleep 4. Non-specific abnormal electrocardiogram (EKG) RECOMMENDATIONS:  1. This study demonstrates moderate to severe obstructive sleep apnea, with a total AHI of 22.9/hour, supine AHI of 52.8/hour and O2 nadir of 74%. Time below 89% saturation was over 90 minutes for the night, indicating nocturnal hypoxemia. Treatment with a positive airway pressure (PAP) device is recommended.  The patient will be advised to proceed with home autoPAP  therapy for now.  A laboratory attended, full night PAP-titration study can be considered down the road, to optimize therapy settings, mask fit, monitoring of tolerance and of proper oxygen saturations. Other treatment options may be limited, and may include (generally speaking) surgical options in selected patients. 2. Please note that untreated obstructive sleep apnea may carry additional perioperative morbidity. Patients with significant obstructive sleep apnea should receive perioperative PAP therapy and the surgeons and particularly the anesthesiologist should be informed  of the diagnosis and the severity of the sleep disordered breathing. 3. The study showed occasional PVCs on single lead EKG; clinical correlation is recommended. Of note, the patient is well-know to cardiology. 4. This study shows sleep fragmentation and mildly abnormal sleep stage percentages; these are nonspecific findings and per se do not signify an intrinsic sleep disorder or a cause for the patient's sleep-related symptoms. Causes include (but are not limited to) the first night effect of the sleep study, circadian rhythm disturbances, medication effect or an underlying mood disorder or medical problem. 5. The patient should be cautioned not to drive, work at heights, or operate dangerous or heavy equipment when tired or sleepy. Review and reiteration of good sleep hygiene measures should be pursued with any patient. 6. The patient will be seen in follow-up in the sleep clinic at [facility] for discussion of the test results, symptom and treatment compliance review, further management strategies, etc. The patient and the referring provider will be notified of the test results. I certify that I have reviewed the entire raw data recording prior to the issuance of this report in accordance with the Standards of Accreditation of the American Academy of Sleep Medicine (AASM). Debbra Fairy, MD, PhD Medical Director, [facility]) Diplomat, ABPN (Neurology and Sleep)    Technical Report: General Information Name: Lagena, Haba BMI: 16.10 Physician: Debbra Fairy, MD ID: [id number] Height: 65.5 in Technician: Octavia Belton, RPSGT Sex: Female Weight: 171.0 lb Record: [id number] Age: 20 [1956/09/06] Date: 04/17/2024   Medical & Medication History   Ms. Shankland is a 68 year old female with an underlying medical history of hypertension, hypothyroidism, hyperlipidemia, restless leg syndrome, anxiety, depression, chronic back pain, arthritis, status post bilateral knee replacement surgeries, status post bilateral carpal tunnel surgeries, status post tonsillectomy, and overweight state, who presents for follow-up consultation of her sleep disturbance, concern for obstructive sleep apnea. I first met her at the request of her primary care physician on 07/14/2023, at which time she reported snoring and sleep disruption. She had daytime tiredness. She was advised to proceed with a sleep study. She did not proceed with testing at the time. Today, 03/20/2024: She reports worsening daytime somnolence. Her husband also feels that her snoring has become louder. She has lost weight in the past 6 months. Wounds on Zepbound  but is currently not on it. She reports ongoing symptoms of restless leg syndrome, typically when she is in bed at night trying to fall asleep. She takes Tylenol  PM every night. She goes to bed somewhere between 11 PM and 1 AM. Typical rise time is between 10 AM and noon. She has not tried melatonin. Her husband endorses that she has leg twitching in her sleep. She also has some mumbling with sleep talking at times. She is not sure if she had sleep talking as a child. Her Epworth sleepiness score is 13 out of 24, fatigue severity score is 40 out of 63. Tylenol , Biotin w Vitamin C & E,Vitaamin D, Voltaren , Lexapro , Flonase, Ginko Biloba, Microzide , XYZal , Crestor , Synthroid , Risaquad  Sleep Disorder    Comments   Patient arrived for a diagnostic polysomnogram. Procedure explained and all questions answered. Standard paste setup without complications. 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 She is wearing it tonight. Patient slept supine, left, and right. Mild intermittent snoring heard. Respiratory events observed, worse while supine. After 2 hours total sleep time, AHI = 32. Cardiac arrhythmias observed, including PVC's. Patient has a known cardiac  history. No significant PLMS observed. One restroom visit.  Lights out: 09:52:28 PM Lights on: 05:03:29 AM Time Total Supine Side Prone Upright Recording (TRT) 7h 11.6m 1h 24.98m 5h 46.22m 0h 0.60m 0h 0.70m Sleep (TST) 6h 20.68m 0h 54.49m 5h 26.33m 0h 0.55m 0h 0.53m Latency N1 N2 N3 REM Onset Per. Slp. Eff. Actual 0h 0.68m 0h 16.44m 0h 59.54m 3h 24.74m 0h 19.75m 0h 46.20m 88.28% Stg Dur Wake N1 N2 N3 REM Total 50.5 26.5 247.0 41.0 66.0 Supine 30.0 0.0 50.0 4.5 0.0 Side 20.5 26.5 197.0 36.5 66.0 Prone 0.0 0.0 0.0 0.0 0.0 Upright 0.0 0.0 0.0 0.0 0.0  Stg % Wake N1 N2 N3 REM Total 11.7 7.0 64.9 10.8 17.3 Supine 7.0 0.0 13.1 1.2 0.0 Side 4.8 7.0 51.8 9.6 17.3 Prone 0.0 0.0 0.0 0.0 0.0 Upright 0.0 0.0 0.0 0.0 0.0  Apnea Summary Sub Supine Side Prone Upright Total 45 Total 45 11 34 0 0   REM 0 0 0 0 0   NREM 45 11 34 0 0 Obs 39 REM 0 0 0 0 0   NREM 39 10 29 0 0 Mix 3 REM 0 0 0 0 0   NREM 3 1 2  0 0 Cen 3 REM 0 0 0 0 0   NREM 3 0 3 0 0 Rera Summary Sub Supine Side Prone Upright Total 0 Total 0 0 0 0 0   REM 0 0 0 0 0   NREM 0 0 0 0 0  Hypopnea Summary Sub Supine Side Prone Upright Total 127 Total 127 46 81 0 0   REM 11 0 11 0 0   NREM 116 46 70 0 0 4% Hypopnea Summary Sub Supine Side Prone Upright Total (4%) 100 Total 100 37 63 0 0   REM 5 0 5 0 0   NREM 95 37 58 0 0  AHI Total Obs Mix Cen 27.12 Apnea 7.10 6.15 0.47 0.47  Hypopnea 20.03 -- -- -- 22.86 Hypopnea (4%) 15.77 -- -- --  Total Supine Side Prone Upright Position AHI 27.12 62.75 21.17 0.00 0.00 REM AHI 10.00  NREM AHI 30.72   Position RDI 27.12 62.75 21.17 0.00 0.00 REM RDI 10.00  NREM RDI 30.72  4% Hypopnea Total Supine Side Prone Upright Position AHI (4%) 22.86 52.84 17.85 0.00 0.00 REM AHI (4%) 4.55  NREM AHI (4%) 26.71  Position RDI (4%) 22.86 52.84 17.85 0.00 0.00 REM RDI (4%) 4.55  NREM RDI (4%) 26.71  Desaturation Information Threshold: 2% <100% <90% <80% <70% <60% <50% <40% Supine 88.0 67.0 0.0 0.0 0.0 0.0 0.0 Side 243.0 176.0 0.0 0.0 0.0 0.0 0.0 Prone 0.0 0.0 0.0 0.0 0.0 0.0 0.0 Upright 0.0 0.0 0.0 0.0 0.0 0.0 0.0 Total 331.0 243.0 0.0 0.0 0.0 0.0 0.0 Index 48.3 35.4 0.0 0.0 0.0 0.0 0.0 Threshold: 3% <100% <90% <80% <70% <60% <50% <40% Supine 71.0 58.0 0.0 0.0 0.0 0.0 0.0 Side 162.0 130.0 0.0 0.0 0.0 0.0 0.0 Prone 0.0 0.0 0.0 0.0 0.0 0.0 0.0 Upright 0.0 0.0 0.0 0.0 0.0 0.0 0.0 Total 233.0 188.0 0.0 0.0 0.0 0.0 0.0 Index 34.0 27.4 0.0 0.0 0.0 0.0 0.0 Threshold: 4% <100% <90% <80% <70% <60% <50% <40% Supine 54.0 45.0 0.0 0.0 0.0 0.0 0.0 Side 119.0 103.0 0.0 0.0 0.0 0.0 0.0 Prone 0.0 0.0 0.0 0.0 0.0 0.0 0.0 Upright 0.0 0.0 0.0 0.0 0.0 0.0 0.0 Total 173.0 148.0 0.0 0.0 0.0 0.0 0.0 Index 25.2 21.6 0.0 0.0 0.0 0.0 0.0 Threshold:  3% <100% <90% <80% <70% <60% <50% <40% Supine 71 58 0 0 0 0 0 Side 162 130 0 0 0 0 0 Prone 0 0 0 0 0 0 0 Upright 0 0 0 0 0 0 0 Total 233 188 0 0 0 0 0  Awakening/Arousal Information # of Awakenings 15 Wake after sleep onset 31.81m Wake after persistent sleep 19.26m Arousal Assoc. Arousals Index Apneas 21 3.3 Hypopneas 25 3.9 Leg Movements 3 0.5 Snore 0 0.0 PTT Arousals 0 0.0 Spontaneous 71 11.2 Total 119 18.8 Leg Movement Information PLMS LMs Index Total LMs during PLMS 0 0.0 LMs w/ Microarousals 0 0.0 LM LMs Index w/ Microarousal 3 0.5 w/ Awakening 0 0.0 w/ Resp Event 1 0.2 Spontaneous 7 1.1 Total 11 1.7  Desaturation threshold setting: 3% Minimum desaturation setting: 10 seconds SaO2 nadir: 56% The longest event was a 35 sec obstructive Apnea with a minimum SaO2 of 86%. The lowest SaO2 was 74% associated with  a 19 sec obstructive Apnea. EKG Rates EKG Avg Max Min Awake 68 87 54 Asleep 68 90 55 EKG Events: N/A     Results LABS Potassium: Low (10/28/2023) A1c: Prediabetic  DIAGNOSTIC Sleep study: Obstructive sleep apnea    Assessment & Plan OSA (obstructive sleep apnea) Obstructive sleep apnea management includes initiating CPAP therapy upon the machine's arrival. Discussed the potential benefits of Zepbound  for weight loss and sleep apnea management, which may reduce CPAP dependency over time. Submit a request for Zepbound , noting potential insurance denial despite FDA approval. Highlight the complications of untreated sleep apnea, including dementia, myocardial infarction, and atrial fibrillation. Hypertriglyceridemia Elevated triglycerides necessitate a switch to icosapent  ethyl (Vascepa ) for better management and cardiovascular risk reduction. Prescribe icosapent  ethyl to replace current omega-3 supplements and order blood work to assess triglyceride levels. Aortic atherosclerosis (HCC) Atherosclerosis with elevated triglycerides requires management to improve arterial health and reduce cardiovascular risks. Start icosapent  ethyl (Vascepa ) to replace current omega-3 supplements, as it effectively prevents myocardial infarction and cerebrovascular accidents. Provide a handout on the benefits of avocado and extra virgin olive oil for cardiovascular health. Order blood work to assess cholesterol levels and address potential insurance coverage issues for the medication. Prediabetes With prediabetes and previous hyperglycemia, monitoring A1c is crucial to prevent progression to diabetes. Order blood work to assess A1c levels. History of lower GI bleeding Will order lab testing to guide management.  Acquired hypothyroidism Will order lab testing to guide management.  Restless legs Restless legs syndrome may be related to iron deficiency. Order blood work to assess iron levels and discuss the  potential link between restless legs and iron deficiency. Hypokalemia Previous hypokalemia requires attention to a high potassium diet, avoiding supplements unless necessary. Order blood work to assess potassium levels and provide a handout on a high potassium diet. Lab Results  Component Value Date/Time   K 3.7 04/26/2024 08:59 AM   K 3.3 (L) 11/22/2023 09:09 AM   K 3.2 (L) 05/30/2023 04:20 PM   K 3.1 (L) 04/14/2023 01:26 PM   K 3.5 09/27/2022 03:51 PM   K 3.9 02/09/2022 10:12 AM   K 3.7 04/14/2021 12:04 PM   Thoracic aortic aneurysm without rupture, unspecified part (HCC)        Orders Placed During this Encounter:   Orders Placed This Encounter  Procedures   Lipid panel    [place]    Has the patient fasted?:   No    Release to patient:   Immediate [1]   Comprehensive metabolic  panel with GFR    Has the patient fasted?:   No    Release to patient:   Immediate [1]   CBC with Differential/Platelet    Release to patient:   Immediate [1]   TSH Rfx on Abnormal to Free T4   HgB A1c   Ferritin   T4F   Meds ordered this encounter  Medications   tirzepatide  (ZEPBOUND ) 2.5 MG/0.5ML Pen    Sig: Inject 2.5 mg into the skin once a week.    Dispense:  2 mL    Refill:  11   icosapent  Ethyl (VASCEPA ) 1 g capsule    Sig: Take 2 capsules (2 g total) by mouth 2 (two) times daily.    Dispense:  360 capsule    Refill:  3       This document was synthesized by artificial intelligence ([employer]bridge) using HIPAA-compliant recording of the clinical interaction;   We discussed the use of AI scribe software for clinical note transcription with the patient, who gave verbal consent to proceed. additional Info: This encounter employed state-of-the-art, real-time, collaborative documentation. The patient actively reviewed and assisted in updating their electronic medical record on a shared screen, ensuring transparency and facilitating joint problem-solving for the problem list, overview, and plan.  This approach promotes accurate, informed care. The treatment plan was discussed and reviewed in detail, including medication safety, potential side effects, and all patient questions. We confirmed understanding and comfort with the plan. Follow-up instructions were established, including contacting the office for any concerns, returning if symptoms worsen, persist, or new symptoms develop, and precautions for potential emergency department visits.

## 2024-04-27 ENCOUNTER — Encounter: Payer: Self-pay | Admitting: Internal Medicine

## 2024-04-27 ENCOUNTER — Other Ambulatory Visit: Payer: Self-pay

## 2024-04-27 ENCOUNTER — Telehealth: Payer: Self-pay

## 2024-04-27 DIAGNOSIS — E039 Hypothyroidism, unspecified: Secondary | ICD-10-CM

## 2024-04-27 DIAGNOSIS — G4730 Sleep apnea, unspecified: Secondary | ICD-10-CM

## 2024-04-27 LAB — T4F: T4,Free (Direct): 1.34 ng/dL (ref 0.82–1.77)

## 2024-04-27 LAB — TSH RFX ON ABNORMAL TO FREE T4: TSH: 0.105 u[IU]/mL — ABNORMAL LOW (ref 0.450–4.500)

## 2024-04-27 MED ORDER — LEVOTHYROXINE SODIUM 88 MCG PO TABS
88.0000 ug | ORAL_TABLET | Freq: Every day | ORAL | 3 refills | Status: AC
Start: 2024-04-27 — End: ?

## 2024-04-27 MED ORDER — ASPIRIN 81 MG PO TBEC: 81.0000 mg | DELAYED_RELEASE_TABLET | Freq: Every day | ORAL | 3 refills | Status: DC

## 2024-04-27 MED ORDER — TIRZEPATIDE-WEIGHT MANAGEMENT 2.5 MG/0.5ML ~~LOC~~ SOLN: 2.5000 mg | SUBCUTANEOUS | 2 refills | Status: DC

## 2024-04-27 NOTE — Assessment & Plan Note (Signed)
 Will order lab testing to guide management.

## 2024-04-27 NOTE — Telephone Encounter (Signed)
 Spoke with pt via phone took care of her questions   Copied from CRM [id number]. Topic: General - Other >> Apr 27, 2024 12:49 PM Kita Perish H wrote: Reason for CRM: Patient would like to speak with [facility] regarding medication only information patient would give agent.  [facility]ma  [phone]

## 2024-04-27 NOTE — Assessment & Plan Note (Signed)
 Restless legs syndrome may be related to iron deficiency. Order blood work to assess iron levels and discuss the potential link between restless legs and iron deficiency.

## 2024-04-27 NOTE — Telephone Encounter (Signed)
 read by Dorlene Gary at 9:15AM on 04/27/2024

## 2024-04-29 MED ORDER — TIRZEPATIDE-WEIGHT MANAGEMENT 2.5 MG/0.5ML ~~LOC~~ SOLN: 2.5000 mg | SUBCUTANEOUS | 2 refills | Status: DC

## 2024-05-03 ENCOUNTER — Ambulatory Visit
Admission: RE | Admit: 2024-05-03 | Discharge: 2024-05-03 | Disposition: A | Discharge status: Home / Self Care | Source: Ambulatory Visit | Attending: Internal Medicine | Admitting: Internal Medicine

## 2024-05-03 ENCOUNTER — Other Ambulatory Visit: Payer: Self-pay | Admitting: Internal Medicine

## 2024-05-03 ENCOUNTER — Encounter: Payer: Self-pay | Admitting: Internal Medicine

## 2024-05-03 DIAGNOSIS — E039 Hypothyroidism, unspecified: Secondary | ICD-10-CM

## 2024-05-03 DIAGNOSIS — G2581 Restless legs syndrome: Secondary | ICD-10-CM

## 2024-05-03 DIAGNOSIS — R7303 Prediabetes: Secondary | ICD-10-CM

## 2024-05-03 DIAGNOSIS — E781 Pure hyperglyceridemia: Secondary | ICD-10-CM

## 2024-05-03 DIAGNOSIS — I712 Thoracic aortic aneurysm, without rupture, unspecified: Secondary | ICD-10-CM

## 2024-05-03 DIAGNOSIS — I700 Atherosclerosis of aorta: Secondary | ICD-10-CM

## 2024-05-03 DIAGNOSIS — Z8719 Personal history of other diseases of the digestive system: Secondary | ICD-10-CM

## 2024-05-03 DIAGNOSIS — G4733 Obstructive sleep apnea (adult) (pediatric): Secondary | ICD-10-CM

## 2024-05-03 DIAGNOSIS — E876 Hypokalemia: Secondary | ICD-10-CM

## 2024-05-03 MED ORDER — IOPAMIDOL (ISOVUE-370) INJECTION 76%
75.0000 mL | Freq: Once | INTRAVENOUS | Status: AC | PRN
  Administered 2024-05-03: 75 mL via INTRAVENOUS

## 2024-07-11 ENCOUNTER — Encounter: Payer: Self-pay | Admitting: *Deleted

## 2024-07-12 ENCOUNTER — Ambulatory Visit: Admitting: Neurology

## 2024-07-12 ENCOUNTER — Encounter: Payer: Self-pay | Admitting: Neurology

## 2024-07-12 VITALS — BP 114/76 | HR 65 | Ht 66.0 in | Wt 181.2 lb

## 2024-07-12 DIAGNOSIS — G4733 Obstructive sleep apnea (adult) (pediatric): Secondary | ICD-10-CM | POA: Diagnosis not present

## 2024-07-12 NOTE — Patient Instructions (Signed)
 It was nice to see you again today. I am glad to hear, things are going well with your autoPAP therapy. You have adjusted well to treatment with your new machine, and you are compliant with it. You have also fulfilled the insurance-mandated compliance percentage, which is reassuring, so you can get ongoing supplies through your insurance. Please talk to your DME provider about getting replacement supplies on a regular basis. Please be sure to change your filter every month, your mask about every 3 months, hose about every 6 months, humidifier chamber about yearly. Some restrictions are imposed by your insurance carrier with regard to how frequently you can get certain supplies.  Your DME company can provide further details if necessary.   Please continue using your autoPAP regularly. While your insurance requires that you use PAP at least 4 hours each night on 70% of the nights, I recommend, that you not skip any nights and use it throughout the night if you can. Getting used to PAP and staying with the treatment long term does take time and patience and discipline. Untreated obstructive sleep apnea when it is moderate to severe can have an adverse impact on cardiovascular health and raise her risk for heart disease, arrhythmias, hypertension, congestive heart failure, stroke and diabetes. Untreated obstructive sleep apnea causes sleep disruption, nonrestorative sleep, and sleep deprivation. This can have an impact on your day to day functioning and cause daytime sleepiness and impairment of cognitive function, memory loss, mood disturbance, and problems focussing. Using PAP regularly can improve these symptoms.  We can see you in 1 year, you can see one of our nurse practitioners as you are stable.

## 2024-07-12 NOTE — Progress Notes (Signed)
 Subjective:    Patient ID: Martha Orr is a 68 y.o. female.  HPI    Interim history:   Martha Orr is a 68 year old female with an underlying medical history of hypertension, hypothyroidism, hyperlipidemia, restless leg syndrome, anxiety, depression, chronic back pain, arthritis, status post bilateral knee replacement surgeries, status post bilateral carpal tunnel surgeries, status post tonsillectomy, and overweight state, who presents for follow-up consultation of her obstructive sleep apnea after interim testing and starting home AutoPap therapy.  The patient is accompanied by her husband today. I last saw her in March 2025, at which time she reported worsening daytime somnolence.  She was advised to proceed with a sleep study.  She had a baseline polysomnogram through our sleep lab on 04/17/2024 which showed moderate to severe obstructive sleep apnea, with a total AHI of 22.9/hour, supine AHI of 52.8/hour and O2 nadir of 74%. Time below 89% saturation was over 90 minutes for the night, indicating nocturnal hypoxemia.  She was advised to proceed with home AutoPap therapy.  Her set up date was 04/30/2024.  She has a Engineer, mining G3 machine.  Her DME company is [facility] out of [place], [place].  Today, 07/12/2024: I reviewed her AutoPap compliance data from 06/12/2024 through 07/11/2024, which is a total of 30 days, during which time she used her machine every night with percent use days greater than 4 hours at 100%, indicating superb compliance, average usage of 8 hours and 30 minutes, residual AHI borderline at 6.2/h, central apnea index 1.1/h, pressure setting of 5 to 11 cm with a 95th percentile at 10 cm, average leak on the low side.  She uses a nasal cushion interface from [employer].  She tolerates it well but sometimes the mask is dislodged when she wakes up.  She is benefiting from treatment, she is still adjusting to it but overall feels better rested and less tired, and is  motivated to continue with treatment.  Epworth sleepiness score is 12 out of 24.  She takes an occasional nap during the day.  The patient's allergies, current medications, family history, past medical history, past social history, past surgical history and problem list were reviewed and updated as appropriate.    Previously:   03/20/2024: She reports worsening daytime somnolence.  Her husband also feels that her snoring has become louder.  She has lost weight in the past 6 months. She was on Zepbound  but is currently not on it.  She reports ongoing symptoms of restless leg syndrome, typically when she is in bed at night trying to fall asleep.  She takes Tylenol  PM every night.  She goes to bed somewhere between 11 PM and 1 AM.  Typical rise time is between 10 AM and noon.  She has not tried melatonin.  Her husband endorses that she has leg twitching in her sleep.  She also has some mumbling with sleep talking at times.  She is not sure if she had sleep talking as a child. Her Epworth sleepiness score is 13 out of 24, fatigue severity score is 40 out of 63.   I first met her at the request of her primary care physician on 07/14/2023, at which time she reported snoring and sleep disruption.  She had daytime tiredness.  She was advised to proceed with a sleep study.  She did not proceed with testing at the time.      07/14/2023: (She) reports snoring, sleep disruption, and excessive daytime somnolence.  Her Epworth sleepiness  score is 5 out of 24, fatigue severity score is 48 out of 63.  I reviewed your office note from 05/30/2023.  Per husband's feedback, she moves her feet during sleep.  She has had restless leg symptoms for the past few years, usually takes Tylenol  PM at night to help her symptoms, restless leg symptoms started after her bilateral knee replacement surgeries in 2017.  She reports that 2 of her sisters have RLS as well.  She also reports that 2 brothers and 1 sister have sleep apnea.  She goes  to bed between 11 and midnight and rise time is between 9 and 10.  She does not have nightly nocturia or recurrent morning headaches.  She is retired, she worked as a Geophysical data processor.  She had a tonsillectomy.  Weight has been slowly coming down.  She does not watch TV in her bedroom.  She lives with her husband, they have 2 grown children, ages 69 and 38.  They have 1 dog in the household.  She drinks caffeine in the form of coffee, 1 cup in the morning, occasional alcohol, she is a non-smoker.  She has been on Lexapro  for over 20 years.   Her Past Medical History Is Significant For: Past Medical History:  Diagnosis Date   Abdominal pain, chronic, right lower quadrant 09/27/2022   68 year old female Abdominal pain across her entire right side but has remote removal of uterus, appendix, and gallbladder. Pain started 10/07/22.  Nausea and abdominal pain on the right, often occurs after eating.  Does not have any diarrhea or constipation.  Does not improve with stool softening.  Seems worse after large meal.  Partial response to bentyl .   Is not affected by urinating does have   Anxiety    Arthritis    Chronic back pain    buldging disc   Depression    takes Lexapro  daily   Family history of adverse reaction to anesthesia    sister gets sick after anesthesia   Gallstones    Hepatic steatosis 10/04/2022   High cholesterol    takes Crestor  daily   History of appendectomy 10/04/2022   History of cholecystectomy 10/04/2022   History of colon polyps    benign   History of hysterectomy 10/04/2022   History of shingles    Hypertension    Hypothyroidism    takes Synthroid  daily   Joint pain    Joint swelling    Microscopic hematuria    states her entire life and family is the same way   Nausea 09/27/2022    Her Past Surgical History Is Significant For: Past Surgical History:  Procedure Laterality Date   ABDOMINAL HYSTERECTOMY  1998   APPENDECTOMY     BREAST BIOPSY     BUNIONECTOMY  Bilateral    CARPAL TUNNEL RELEASE Bilateral    CHOLECYSTECTOMY     COLONOSCOPY     KNEE ARTHROSCOPY Right    KNEE ARTHROSCOPY Left 11/01/2017   Procedure: ARTHROSCOPY LEFT KNEE;  Surgeon: Sheril Coy, MD;  Location: [facility];  Service: Orthopedics;  Laterality: Left;   knot removed from right breast     LAPAROSCOPY N/A 04/15/2023   Procedure: LAPAROSCOPY DIAGNOSTIC;  Surgeon: Kallie Manuelita BROCKS, MD;  Location: [facility];  Service: General;  Laterality: N/A;   NASAL SINUS SURGERY     TONSILLECTOMY     TONSILLECTOMY AND ADENOIDECTOMY  2002   TOTAL KNEE ARTHROPLASTY Bilateral 10/12/2016   Procedure: TOTAL KNEE BILATERAL;  Surgeon: Coy Sheril,  MD;  Location: [facility];  Service: Orthopedics;  Laterality: Bilateral;    Her Family History Is Significant For: Family History  Problem Relation Age of Onset   Heart attack Mother    Hyperlipidemia Mother    Arthritis Father    Arthritis Sister    Sleep apnea Sister    Arthritis Sister    Heart attack Sister    Arthritis Sister    Hyperlipidemia Sister    Stroke Sister    Sleep apnea Sister    Sleep apnea Brother    Cancer Brother    Depression Brother    Heart attack Brother    Heart disease Brother    Hypertension Brother    Hyperlipidemia Brother    Cancer Brother    Heart attack Brother    Colon cancer Neg Hx    Esophageal cancer Neg Hx    Rectal cancer Neg Hx    Stomach cancer Neg Hx     Her Social History Is Significant For: Social History   Socioeconomic History   Marital status: Married    Spouse name: Not on file   Number of children: 2   Years of education: Not on file   Highest education level: Not on file  Occupational History   Not on file  Tobacco Use   Smoking status: Never   Smokeless tobacco: Never  Vaping Use   Vaping status: Never Used  Substance and Sexual Activity   Alcohol use: Yes    Comment: occasionally   Drug use: No   Sexual activity: Not on file  Other Topics Concern   Not on file   Social History Narrative   Retired    Lives with husband    Social Drivers of Corporate investment banker Strain: Low Risk  (02/13/2024)   Overall Financial Resource Strain (CARDIA)    Difficulty of Paying Living Expenses: Not hard at all  Food Insecurity: No Food Insecurity (02/08/2022)   Hunger Vital Sign    Worried About Running Out of Food in the Last Year: Never true    Ran Out of Food in the Last Year: Never true  Transportation Needs: No Transportation Needs (02/08/2022)   PRAPARE - Administrator, Civil Service (Medical): No    Lack of Transportation (Non-Medical): No  Physical Activity: Unknown (02/13/2024)   Exercise Vital Sign    Days of Exercise per Week: 0 days    Minutes of Exercise per Session: Not on file  Stress: No Stress Concern Present (02/13/2024)   [employer] of Occupational Health - Occupational Stress Questionnaire    Feeling of Stress : Not at all  Social Connections: Moderately Integrated (02/13/2024)   Social Connection and Isolation Panel    Frequency of Communication with Friends and Family: More than three times a week    Frequency of Social Gatherings with Friends and Family: Once a week    Attends Religious Services: More than 4 times per year    Active Member of [employer] or Organizations: No    Attends Engineer, structural: Not on file    Marital Status: Married    Her Allergies Are:  No Known Allergies:   Her Current Medications Are:  Outpatient Encounter Medications as of 07/12/2024  Medication Sig   acetaminophen  (TYLENOL ) 325 MG tablet Take 325 mg by mouth every 6 (six) hours as needed for moderate pain.   Biotin w/ Vitamins C & E (HAIR/SKIN/NAILS PO) Take 1 capsule by mouth  daily.   cholecalciferol (VITAMIN D ) 1000 units tablet Take 1,000 Units by mouth at bedtime.   escitalopram  (LEXAPRO ) 20 MG tablet TAKE 1 TABLET DAILY   fluticasone (FLONASE) 50 MCG/ACT nasal spray Place 2 sprays into both nostrils daily as needed  for allergies.   Ginkgo Biloba Extract 60 MG CAPS Take 1 capsule by mouth daily.   hydrochlorothiazide  (MICROZIDE ) 12.5 MG capsule Take 1 capsule (12.5 mg total) by mouth daily.   icosapent  Ethyl (VASCEPA ) 1 g capsule Take 2 capsules (2 g total) by mouth 2 (two) times daily.   levothyroxine  (SYNTHROID ) 88 MCG tablet Take 1 tablet (88 mcg total) by mouth daily.   rosuvastatin  (CRESTOR ) 40 MG tablet Take 1 tablet (40 mg total) by mouth at bedtime.   aspirin  EC 81 MG tablet Take 1 tablet (81 mg total) by mouth daily. Swallow whole.   tirzepatide  (ZEPBOUND ) 2.5 MG/0.5ML injection vial Inject 2.5 mg into the skin once a week.   Facility-Administered Encounter Medications as of 07/12/2024  Medication   acidophilus (RISAQUAD) capsule 1 capsule  :  Review of Systems:  Out of a complete 14 point review of systems, all are reviewed and negative with the exception of these symptoms as listed below: Review of Systems  Neurological:        Pt here for cpap f/u  Pt states some mornings mask is off face     ESS:12    Objective:  Neurological Exam  Physical Exam Physical Examination:   Vitals:   07/12/24 0927  BP: 114/76  Pulse: 65    General Examination: The patient is a very pleasant 68 y.o. female in no acute distress. She appears well-developed and well-nourished and well groomed.   HEENT: Normocephalic, atraumatic, pupils are equal, round and reactive to light, extraocular tracking is good without limitation to gaze excursion or nystagmus noted. Hearing is grossly intact. Face is symmetric with normal facial animation. Speech is clear with no dysarthria noted. There is no hypophonia. There is no lip, neck/head, jaw or voice tremor. Neck is supple with full range of passive and active motion. There are no carotid bruits on auscultation. Oropharynx exam reveals: mild to moderate mouth dryness, adequate dental hygiene and moderate airway crowding.  Tongue protrudes centrally and palate  elevates symmetrically.   Chest: Clear to auscultation without wheezing, rhonchi or crackles noted.   Heart: S1+S2+0, regular and normal without murmurs, rubs or gallops noted.    Abdomen: Soft, non-tender and non-distended.   Extremities: There is no obvious swelling in the distal lower extremities bilaterally.    Skin: Warm and dry without trophic changes noted.    Musculoskeletal: exam reveals no obvious joint deformities.    Neurologically:  Mental status: The patient is awake, alert and oriented in all 4 spheres. Her immediate and remote memory, attention, language skills and fund of knowledge are appropriate. There is no evidence of aphasia, agnosia, apraxia or anomia. Speech is clear with normal prosody and enunciation. Thought process is linear. Mood is normal and affect is normal.  Cranial nerves II - XII are as described above under HEENT exam.  Motor exam: Normal bulk, strength and tone is noted. There is no obvious action or resting tremor.  Fine motor skills and coordination: grossly intact.  Cerebellar testing: No dysmetria or intention tremor. There is no truncal or gait ataxia.  Sensory exam: intact to light touch in the upper and lower extremities.  Gait, station and balance: She stands easily. No veering to one  side is noted. No leaning to one side is noted. Posture is age-appropriate and stance is narrow based. Gait shows normal stride length and normal pace. No problems turning are noted.    Assessment and Plan:    In summary, Martha Orr is a 68 year old female with an underlying medical history of hypertension, hypothyroidism, hyperlipidemia, restless leg syndrome, anxiety, depression, chronic back pain, arthritis, status post bilateral knee replacement surgeries, status post bilateral carpal tunnel surgeries, status post tonsillectomy, and overweight state, who presents for follow-up consultation of her obstructive sleep apnea after interim testing and starting  home AutoPap therapy.  She had a baseline polysomnogram through our sleep lab on 04/17/2024 which showed moderate to severe obstructive sleep apnea, with a total AHI of 22.9/hour, supine AHI of 52.8/hour and O2 nadir of 74%. She established treatment with home AutoPap therapy on 04/30/2024.  She has a Engineer, mining G3 machine.  Her DME company is [employer] [employer] [facility] out of [place], [place]. she reports that she did not actually start using her machine until she came back from vacation in [place] .  She is motivated to continue with treatment and is currently fully compliant for which she is highly commended.  Apnea scores are good, not perfect but she does have a slight degree of central apneas which may improve over time.  I do not recommend changing his settings for that reason.  She is tolerating the interface which is a nasal cushion from [employer].  She is benefiting from treatment and is certainly motivated to continue with it.  She is advised to follow-up routinely in our clinic in 1 year to see one of our nurse practitioners.  I answered all their questions today and the patient and her husband were in agreement. I spent 30 minutes in total face-to-face time and in reviewing records during pre-charting, more than 50% of which was spent in counseling and coordination of care, reviewing test results, reviewing medications and treatment regimen and/or in discussing or reviewing the diagnosis of OSA, the prognosis and treatment options. Pertinent laboratory and imaging test results that were available during this visit with the patient were reviewed by me and considered in my medical decision making (see chart for details).

## 2024-07-31 ENCOUNTER — Other Ambulatory Visit: Payer: Self-pay | Admitting: Internal Medicine

## 2024-08-07 ENCOUNTER — Telehealth: Payer: Self-pay

## 2024-08-07 NOTE — Telephone Encounter (Signed)
 Copied from CRM [id number]. Topic: Clinical - Medication Question >> Aug 07, 2024  3:56 PM Franky GRADE wrote: Reason for CRM: Patient is calling to speak with Dr.Morrison's medical assistant regarding injections, she did not want to go into detail and would like to speak the medical assistant.  Spoke with pt about zepbound  she would like to try to get it covered for sleep apea she just had sleep study done and has it I did let pt know that provider would like to see her in office. Please review and advise Dr Jesus

## 2024-08-08 NOTE — Telephone Encounter (Signed)
 Spoke with pt about appt for zepbound  she understood and will call back later to make appt with provider.

## 2024-08-23 ENCOUNTER — Encounter: Payer: Self-pay | Admitting: Internal Medicine

## 2024-08-23 ENCOUNTER — Ambulatory Visit: Admitting: Internal Medicine

## 2024-08-23 VITALS — BP 110/72 | HR 77 | Temp 98.1°F | Ht 66.0 in | Wt 182.4 lb

## 2024-08-23 DIAGNOSIS — G4733 Obstructive sleep apnea (adult) (pediatric): Secondary | ICD-10-CM

## 2024-08-23 DIAGNOSIS — E669 Obesity, unspecified: Secondary | ICD-10-CM

## 2024-08-23 DIAGNOSIS — Z6829 Body mass index (BMI) 29.0-29.9, adult: Secondary | ICD-10-CM | POA: Diagnosis not present

## 2024-08-23 DIAGNOSIS — J324 Chronic pansinusitis: Secondary | ICD-10-CM | POA: Diagnosis not present

## 2024-08-23 DIAGNOSIS — J309 Allergic rhinitis, unspecified: Secondary | ICD-10-CM

## 2024-08-23 MED ORDER — LORATADINE 10 MG PO TABS: 10.0000 mg | ORAL_TABLET | Freq: Every day | ORAL | 11 refills | Status: DC

## 2024-08-23 MED ORDER — TIRZEPATIDE-WEIGHT MANAGEMENT 2.5 MG/0.5ML ~~LOC~~ SOAJ: 2.5000 mg | SUBCUTANEOUS | 11 refills | Status: DC

## 2024-08-23 MED ORDER — PSEUDOEPHEDRINE HCL ER 120 MG PO TB12: 120.0000 mg | ORAL_TABLET | Freq: Two times a day (BID) | ORAL | 0 refills | Status: DC

## 2024-08-23 MED ORDER — SIMPLY SALINE 0.9 % NA AERS: 2.0000 | INHALATION_SPRAY | NASAL | 11 refills | Status: AC

## 2024-08-23 MED ORDER — FLUTICASONE PROPIONATE 50 MCG/ACT NA SUSP
2.0000 | Freq: Every day | NASAL | 6 refills | Status: AC
Start: 2024-08-23 — End: ?

## 2024-08-23 MED ORDER — AMOXICILLIN-POT CLAVULANATE 875-125 MG PO TABS: 1.0000 | ORAL_TABLET | Freq: Two times a day (BID) | ORAL | 0 refills | Status: DC

## 2024-08-23 NOTE — Progress Notes (Signed)
 ==============================  [facility]: [id number]   -- Medical Office Visit --  Patient: Martha Orr      Age: 68 y.o.       Sex:  female  Date:   08/23/2024 Today's Healthcare Provider: Bernardino KANDICE Cone, MD  ==============================   Chief Complaint: Medication Refill (Pt would like to discuss weight loss medication ) and Headache (Pt states has started having a lot of headache past couple of weeks)  Discussed the use of AI scribe software for clinical note transcription with the patient, who gave verbal consent to proceed.  History of Present Illness 68 year old female who presents with concerns about weight loss and memory issues.  She has a history of sleep apnea and has been using a CPAP machine. Recently, she underwent a sleep study and is now adjusting to using nasal prongs, which she finds more comfortable than the full face mask. She experiences dry eyes in the mornings when using the full face mask.  She is concerned about her memory, stating 'I feel like I'm losing it' and is not comfortable taking a memory test.  She has been experiencing sinus headaches and sniffles, describing the headaches as 'real bad' and located in the sinus area. Her eyes feel dry in the mornings. She has not been treating her sniffles and is interested in learning about potential treatments for her sinus issues.  She mentions a past surgery on her nose, resulting in an anatomical issue where one side of her nose protrudes more than the other.  She is interested in weight loss treatments, mentioning that she was previously receiving shots for weight loss but her insurance stopped covering them. She wants to lose weight again, as she did before when she was on the shots.  Background Reviewed: Problem List: has Bilateral primary osteoarthritis of knee; Major depressive disorder, single episode, in remission (HCC); Anxiety; Dyslipidemia;  Hypothyroidism; H/O cold sores; Osteoarthritis; Restless legs; Pulmonary nodules; Otalgia of both ears; Hypertension; Hyperglycemia; Hematuria; Thoracic aortic aneurysm (HCC); Allergic rhinitis; Nausea; Hepatic steatosis; Cough, persistent; Gastroesophageal reflux disease without esophagitis; Globus pharyngeus; Lumbar arthropathy; Occult blood in stools; Hiatal hernia; History of colon polyps; Hemorrhoid; Obesity (BMI 30-39.9); Snoring; Hand pain, left; and Atherosclerosis of aorta (HCC) on their problem list. Past Medical History:  has a past medical history of Abdominal pain, chronic, right lower quadrant (09/27/2022), Anxiety, Arthritis, Chronic back pain, Depression, Family history of adverse reaction to anesthesia, Gallstones, Hepatic steatosis (10/04/2022), High cholesterol, History of appendectomy (10/04/2022), History of cholecystectomy (10/04/2022), History of colon polyps, History of hysterectomy (10/04/2022), History of shingles, Hypertension, Hypothyroidism, Joint pain, Joint swelling, Microscopic hematuria, and Nausea (09/27/2022). Past Surgical History:   has a past surgical history that includes Appendectomy; Carpal tunnel release (Bilateral); Tonsillectomy; Bunionectomy (Bilateral); Knee arthroscopy (Right); Cholecystectomy; Nasal sinus surgery; Colonoscopy; knot removed from right breast; Total knee arthroplasty (Bilateral, 10/12/2016); Knee arthroscopy (Left, 11/01/2017); Breast biopsy; Tonsillectomy and adenoidectomy (2002); Abdominal hysterectomy (1998); and laparoscopy (N/A, 04/15/2023). Social History:   reports that she has never smoked. She has never used smokeless tobacco. She reports current alcohol use. She reports that she does not use drugs. Family History:  family history includes Arthritis in her father, sister, sister, and sister; Cancer in her brother and brother; Depression in her brother; Heart attack in her brother, brother, mother, and sister; Heart disease in her brother;  Hyperlipidemia in her brother, mother, and sister; Hypertension in her brother; Sleep apnea in her brother,  sister, and sister; Stroke in her sister. Allergies:  has no known allergies.   Medication Reconciliation: Current Outpatient Medications on File Prior to Visit  Medication Sig   acetaminophen  (TYLENOL ) 325 MG tablet Take 325 mg by mouth every 6 (six) hours as needed for moderate pain.   Biotin w/ Vitamins C & E (HAIR/SKIN/NAILS PO) Take 1 capsule by mouth daily.   cholecalciferol (VITAMIN D ) 1000 units tablet Take 1,000 Units by mouth at bedtime.   escitalopram  (LEXAPRO ) 20 MG tablet TAKE 1 TABLET DAILY   fluticasone  (FLONASE ) 50 MCG/ACT nasal spray Place 2 sprays into both nostrils daily as needed for allergies.   Ginkgo Biloba Extract 60 MG CAPS Take 1 capsule by mouth daily.   hydrochlorothiazide  (MICROZIDE ) 12.5 MG capsule Take 1 capsule (12.5 mg total) by mouth daily.   levothyroxine  (SYNTHROID ) 88 MCG tablet Take 1 tablet (88 mcg total) by mouth daily.   rosuvastatin  (CRESTOR ) 40 MG tablet Take 1 tablet (40 mg total) by mouth at bedtime.   aspirin  EC 81 MG tablet Take 1 tablet (81 mg total) by mouth daily. Swallow whole.   icosapent  Ethyl (VASCEPA ) 1 g capsule Take 2 capsules (2 g total) by mouth 2 (two) times daily.   tirzepatide  (ZEPBOUND ) 2.5 MG/0.5ML injection vial Inject 2.5 mg into the skin once a week.   Current Facility-Administered Medications on File Prior to Visit  Medication   acidophilus (RISAQUAD) capsule 1 capsule  There are no discontinued medications.   Physical Exam:    08/23/2024   10:40 AM 07/12/2024    9:27 AM 04/26/2024    8:02 AM  Vitals with BMI  Height 5' 6 5' 6 5' 5.5  Weight 182 lbs 6 oz 181 lbs 3 oz 176 lbs  BMI 29.45 29.26 28.83  Systolic 110 114 881  Diastolic 72 76 62  Pulse 77 65 69  Vital signs reviewed.  Nursing notes reviewed. Weight trend reviewed. Physical Activity: Unknown (02/13/2024)   Exercise Vital Sign    Days of Exercise  per Week: 0 days    Minutes of Exercise per Session: Not on file   General Appearance:  No acute distress appreciable.   Well-groomed, healthy-appearing female.  Well proportioned with no abnormal fat distribution.  Good muscle tone. Pulmonary:  Normal work of breathing at rest, no respiratory distress apparent. SpO2: 98 %  Musculoskeletal: All extremities are intact.  Neurological:  Awake, alert, oriented, and engaged.  No obvious focal neurological deficits or cognitive impairments.  Sensorium seems unclouded.   Speech is clear and coherent with logical content. Psychiatric:  Appropriate mood, pleasant and cooperative demeanor, thoughtful and engaged during the exam Frequent sniffling and dynamic nasal obstructions  Results:    08/23/2024   10:44 AM 04/26/2024    8:08 AM 11/22/2023    8:27 AM 08/19/2023    8:23 AM  PHQ 2/9 Scores  PHQ - 2 Score 0 0 0 0  PHQ- 9 Score 0 0 0    Office Visit on 04/26/2024  Component Date Value Ref Range Status   Cholesterol 04/26/2024 161  0 - 200 mg/dL Final   Triglycerides 94/98/7974 164.0 (H)  0.0 - 149.0 mg/dL Final   HDL 94/98/7974 62.70  >39.00 mg/dL Final   VLDL 94/98/7974 32.8  0.0 - 40.0 mg/dL Final   LDL Cholesterol 04/26/2024 66  0 - 99 mg/dL Final   Total CHOL/HDL Ratio 04/26/2024 3   Final   NonHDL 04/26/2024 98.69   Final   Sodium 04/26/2024  142  135 - 145 mEq/L Final   Potassium 04/26/2024 3.7  3.5 - 5.1 mEq/L Final   Chloride 04/26/2024 105  96 - 112 mEq/L Final   CO2 04/26/2024 29  19 - 32 mEq/L Final   Glucose, Bld 04/26/2024 97  70 - 99 mg/dL Final   BUN 94/98/7974 17  6 - 23 mg/dL Final   Creatinine, Ser 04/26/2024 0.72  0.40 - 1.20 mg/dL Final   Total Bilirubin 04/26/2024 0.5  0.2 - 1.2 mg/dL Final   Alkaline Phosphatase 04/26/2024 37 (L)  39 - 117 U/L Final   AST 04/26/2024 17  0 - 37 U/L Final   ALT 04/26/2024 16  0 - 35 U/L Final   Total Protein 04/26/2024 6.9  6.0 - 8.3 g/dL Final   Albumin 94/98/7974 4.2  3.5 - 5.2 g/dL  Final   GFR 94/98/7974 84.96  >60.00 mL/min Final   Calcium  04/26/2024 9.4  8.4 - 10.5 mg/dL Final   WBC 94/98/7974 5.7  4.0 - 10.5 K/uL Final   RBC 04/26/2024 4.12  3.87 - 5.11 Mil/uL Final   Hemoglobin 04/26/2024 12.8  12.0 - 15.0 g/dL Final   HCT 94/98/7974 37.2  36.0 - 46.0 % Final   MCV 04/26/2024 90.2  78.0 - 100.0 fl Final   MCHC 04/26/2024 34.5  30.0 - 36.0 g/dL Final   RDW 94/98/7974 13.0  11.5 - 15.5 % Final   Platelets 04/26/2024 219.0  150.0 - 400.0 K/uL Final   Neutrophils Relative % 04/26/2024 45.4  43.0 - 77.0 % Final   Lymphocytes Relative 04/26/2024 37.4  12.0 - 46.0 % Final   Monocytes Relative 04/26/2024 10.2  3.0 - 12.0 % Final   Eosinophils Relative 04/26/2024 6.4 (H)  0.0 - 5.0 % Final   Basophils Relative 04/26/2024 0.6  0.0 - 3.0 % Final   Neutro Abs 04/26/2024 2.6  1.4 - 7.7 K/uL Final   Lymphs Abs 04/26/2024 2.1  0.7 - 4.0 K/uL Final   Monocytes Absolute 04/26/2024 0.6  0.1 - 1.0 K/uL Final   Eosinophils Absolute 04/26/2024 0.4  0.0 - 0.7 K/uL Final   Basophils Absolute 04/26/2024 0.0  0.0 - 0.1 K/uL Final   TSH 04/26/2024 0.105 (L)  0.450 - 4.500 uIU/mL Final   Hgb A1c MFr Bld 04/26/2024 5.9  4.6 - 6.5 % Final   Ferritin 04/26/2024 85.2  10.0 - 291.0 ng/mL Final   T4,Free (Direct) 04/26/2024 1.34  0.82 - 1.77 ng/dL Final  Office Visit on 11/22/2023  Component Date Value Ref Range Status   Rheumatoid fact SerPl-aCnc 11/22/2023 11  <14 IU/mL Final   Cyclic Citrullin Peptide Ab 88/73/7975 <16  UNITS Final   Cholesterol 11/22/2023 133  0 - 200 mg/dL Final   Triglycerides 88/73/7975 174.0 (H)  0.0 - 149.0 mg/dL Final   HDL 88/73/7975 52.90  >39.00 mg/dL Final   VLDL 88/73/7975 34.8  0.0 - 40.0 mg/dL Final   LDL Cholesterol 11/22/2023 45  0 - 99 mg/dL Final   Total CHOL/HDL Ratio 11/22/2023 3   Final   NonHDL 11/22/2023 80.12   Final   Sodium 11/22/2023 140  135 - 145 mEq/L Final   Potassium 11/22/2023 3.3 (L)  3.5 - 5.1 mEq/L Final   Chloride 11/22/2023 100   96 - 112 mEq/L Final   CO2 11/22/2023 32  19 - 32 mEq/L Final   Glucose, Bld 11/22/2023 90  70 - 99 mg/dL Final   BUN 88/73/7975 15  6 - 23 mg/dL  Final   Creatinine, Ser 11/22/2023 0.75  0.40 - 1.20 mg/dL Final   Total Bilirubin 11/22/2023 0.5  0.2 - 1.2 mg/dL Final   Alkaline Phosphatase 11/22/2023 34 (L)  39 - 117 U/L Final   AST 11/22/2023 20  0 - 37 U/L Final   ALT 11/22/2023 20  0 - 35 U/L Final   Total Protein 11/22/2023 7.0  6.0 - 8.3 g/dL Final   Albumin 88/73/7975 4.3  3.5 - 5.2 g/dL Final   GFR 88/73/7975 81.14  >60.00 mL/min Final   Calcium  11/22/2023 9.4  8.4 - 10.5 mg/dL Final   WBC 88/73/7975 6.0  4.0 - 10.5 K/uL Final   RBC 11/22/2023 4.33  3.87 - 5.11 Mil/uL Final   Hemoglobin 11/22/2023 13.2  12.0 - 15.0 g/dL Final   HCT 88/73/7975 38.3  36.0 - 46.0 % Final   MCV 11/22/2023 88.4  78.0 - 100.0 fl Final   MCHC 11/22/2023 34.4  30.0 - 36.0 g/dL Final   RDW 88/73/7975 13.5  11.5 - 15.5 % Final   Platelets 11/22/2023 268.0  150.0 - 400.0 K/uL Final   Neutrophils Relative % 11/22/2023 53.7  43.0 - 77.0 % Final   Lymphocytes Relative 11/22/2023 31.5  12.0 - 46.0 % Final   Monocytes Relative 11/22/2023 9.5  3.0 - 12.0 % Final   Eosinophils Relative 11/22/2023 4.7  0.0 - 5.0 % Final   Basophils Relative 11/22/2023 0.6  0.0 - 3.0 % Final   Neutro Abs 11/22/2023 3.2  1.4 - 7.7 K/uL Final   Lymphs Abs 11/22/2023 1.9  0.7 - 4.0 K/uL Final   Monocytes Absolute 11/22/2023 0.6  0.1 - 1.0 K/uL Final   Eosinophils Absolute 11/22/2023 0.3  0.0 - 0.7 K/uL Final   Basophils Absolute 11/22/2023 0.0  0.0 - 0.1 K/uL Final   Color, Urine 11/22/2023 YELLOW  Yellow;Lt. Yellow;Straw;Dark Yellow;Amber;Green;Red;Brown Final   APPearance 11/22/2023 Sl Cloudy (A)  Clear;Turbid;Slightly Cloudy;Cloudy Final   Specific Gravity, Urine 11/22/2023 >=1.030 (A)  1.000 - 1.030 Final   pH 11/22/2023 6.0  5.0 - 8.0 Final   Total Protein, Urine 11/22/2023 NEGATIVE  Negative Final   Urine Glucose 11/22/2023  NEGATIVE  Negative Final   Ketones, ur 11/22/2023 NEGATIVE  Negative Final   Bilirubin Urine 11/22/2023 NEGATIVE  Negative Final   Hgb urine dipstick 11/22/2023 SMALL (A)  Negative Final   Urobilinogen, UA 11/22/2023 0.2  0.0 - 1.0 Final   Leukocytes,Ua 11/22/2023 TRACE (A)  Negative Final   Nitrite 11/22/2023 NEGATIVE  Negative Final   WBC, UA 11/22/2023 11-20/hpf (A)  0-2/hpf Final   RBC / HPF 11/22/2023 3-6/hpf (A)  0-2/hpf Final   Mucus, UA 11/22/2023 Presence of (A)  None Final   Squamous Epithelial / HPF 11/22/2023 Rare(0-4/hpf)  Rare(0-4/hpf) Final   Bacteria, UA 11/22/2023 Rare(<10/hpf) (A)  None Final   Amorphous 11/22/2023 Present (A)  None;Present Final  Office Visit on 09/20/2023  Component Date Value Ref Range Status   Cholesterol, Total 09/20/2023 147  100 - 199 mg/dL Final   Triglycerides 90/75/7975 194 (H)  0 - 149 mg/dL Final   HDL 90/75/7975 61  >39 mg/dL Final   VLDL Cholesterol Cal 09/20/2023 31  5 - 40 mg/dL Final   LDL Chol Calc (NIH) 09/20/2023 55  0 - 99 mg/dL Final   Chol/HDL Ratio 09/20/2023 2.4  0.0 - 4.4 ratio Final   Lipoprotein (a) 09/20/2023 <8.4  <75.0 nmol/L Final  Office Visit on 05/30/2023  Component Date Value Ref  Range Status   Sodium 05/30/2023 141  135 - 145 mEq/L Final   Potassium 05/30/2023 3.2 (L)  3.5 - 5.1 mEq/L Final   Chloride 05/30/2023 98  96 - 112 mEq/L Final   CO2 05/30/2023 34 (H)  19 - 32 mEq/L Final   Glucose, Bld 05/30/2023 119 (H)  70 - 99 mg/dL Final   BUN 93/96/7975 13  6 - 23 mg/dL Final   Creatinine, Ser 05/30/2023 0.80  0.40 - 1.20 mg/dL Final   Total Bilirubin 05/30/2023 0.3  0.2 - 1.2 mg/dL Final   Alkaline Phosphatase 05/30/2023 44  39 - 117 U/L Final   AST 05/30/2023 22  0 - 37 U/L Final   ALT 05/30/2023 24  0 - 35 U/L Final   Total Protein 05/30/2023 7.1  6.0 - 8.3 g/dL Final   Albumin 93/96/7975 4.3  3.5 - 5.2 g/dL Final   GFR 93/96/7975 75.35  >60.00 mL/min Final   Calcium  05/30/2023 9.7  8.4 - 10.5 mg/dL Final    WBC 93/96/7975 5.9  4.0 - 10.5 K/uL Final   RBC 05/30/2023 4.26  3.87 - 5.11 Mil/uL Final   Hemoglobin 05/30/2023 12.9  12.0 - 15.0 g/dL Final   HCT 93/96/7975 38.1  36.0 - 46.0 % Final   MCV 05/30/2023 89.3  78.0 - 100.0 fl Final   MCHC 05/30/2023 34.0  30.0 - 36.0 g/dL Final   RDW 93/96/7975 12.9  11.5 - 15.5 % Final   Platelets 05/30/2023 245.0  150.0 - 400.0 K/uL Final   Neutrophils Relative % 05/30/2023 41.7 (L)  43.0 - 77.0 % Final   Lymphocytes Relative 05/30/2023 40.8  12.0 - 46.0 % Final   Monocytes Relative 05/30/2023 11.6  3.0 - 12.0 % Final   Eosinophils Relative 05/30/2023 5.2 (H)  0.0 - 5.0 % Final   Basophils Relative 05/30/2023 0.7  0.0 - 3.0 % Final   Neutro Abs 05/30/2023 2.5  1.4 - 7.7 K/uL Final   Lymphs Abs 05/30/2023 2.4  0.7 - 4.0 K/uL Final   Monocytes Absolute 05/30/2023 0.7  0.1 - 1.0 K/uL Final   Eosinophils Absolute 05/30/2023 0.3  0.0 - 0.7 K/uL Final   Basophils Absolute 05/30/2023 0.0  0.0 - 0.1 K/uL Final   TSH 05/30/2023 0.965  0.450 - 4.500 uIU/mL Final  Hospital Outpatient Visit on 04/14/2023  Component Date Value Ref Range Status   Sodium 04/14/2023 135  135 - 145 mmol/L Final   Potassium 04/14/2023 3.1 (L)  3.5 - 5.1 mmol/L Final   Chloride 04/14/2023 100  98 - 111 mmol/L Final   CO2 04/14/2023 26  22 - 32 mmol/L Final   Glucose, Bld 04/14/2023 177 (H)  70 - 99 mg/dL Final   BUN 95/81/7975 14  8 - 23 mg/dL Final   Creatinine, Ser 04/14/2023 0.82  0.44 - 1.00 mg/dL Final   Calcium  04/14/2023 9.1  8.9 - 10.3 mg/dL Final   GFR, Estimated 04/14/2023 >60  >60 mL/min Final   Anion gap 04/14/2023 9  5 - 15 Final  Lab on 10/18/2022  Component Date Value Ref Range Status   Fecal Occult Bld 10/18/2022 Positive (A)  Negative Final  Lab on 10/18/2022  Component Date Value Ref Range Status   WBC 10/18/2022 5.7  4.0 - 10.5 K/uL Final   RBC 10/18/2022 4.51  3.87 - 5.11 Mil/uL Final   Hemoglobin 10/18/2022 13.7  12.0 - 15.0 g/dL Final   HCT 89/76/7976  40.3  36.0 - 46.0 % Final  MCV 10/18/2022 89.2  78.0 - 100.0 fl Final   MCHC 10/18/2022 33.9  30.0 - 36.0 g/dL Final   RDW 89/76/7976 13.1  11.5 - 15.5 % Final   Platelets 10/18/2022 266.0  150.0 - 400.0 K/uL Final   Neutrophils Relative % 10/18/2022 47.3  43.0 - 77.0 % Final   Lymphocytes Relative 10/18/2022 38.2  12.0 - 46.0 % Final   Monocytes Relative 10/18/2022 11.3  3.0 - 12.0 % Final   Eosinophils Relative 10/18/2022 2.8  0.0 - 5.0 % Final   Basophils Relative 10/18/2022 0.4  0.0 - 3.0 % Final   Neutro Abs 10/18/2022 2.7  1.4 - 7.7 K/uL Final   Lymphs Abs 10/18/2022 2.2  0.7 - 4.0 K/uL Final   Monocytes Absolute 10/18/2022 0.6  0.1 - 1.0 K/uL Final   Eosinophils Absolute 10/18/2022 0.2  0.0 - 0.7 K/uL Final   Basophils Absolute 10/18/2022 0.0  0.0 - 0.1 K/uL Final  Lab on 10/15/2022  Component Date Value Ref Range Status   Fecal Occult Bld 10/15/2022 Positive (A)  Negative Final  Office Visit on 10/08/2022  Component Date Value Ref Range Status   H pylori Ag, Stl 10/14/2022 Negative  Negative Final  Office Visit on 09/27/2022  Component Date Value Ref Range Status   WBC 09/27/2022 7.2  4.0 - 10.5 K/uL Final   RBC 09/27/2022 4.21  3.87 - 5.11 Mil/uL Final   Platelets 09/27/2022 228.0  150.0 - 400.0 K/uL Final   Hemoglobin 09/27/2022 13.2  12.0 - 15.0 g/dL Final   HCT 89/97/7976 37.6  36.0 - 46.0 % Final   MCV 09/27/2022 89.3  78.0 - 100.0 fl Final   MCHC 09/27/2022 35.0  30.0 - 36.0 g/dL Final   RDW 89/97/7976 13.1  11.5 - 15.5 % Final   Sodium 09/27/2022 140  135 - 145 mEq/L Final   Potassium 09/27/2022 3.5  3.5 - 5.1 mEq/L Final   Chloride 09/27/2022 102  96 - 112 mEq/L Final   CO2 09/27/2022 29  19 - 32 mEq/L Final   Glucose, Bld 09/27/2022 119 (H)  70 - 99 mg/dL Final   BUN 89/97/7976 13  6 - 23 mg/dL Final   Creatinine, Ser 09/27/2022 0.89  0.40 - 1.20 mg/dL Final   Total Bilirubin 09/27/2022 0.4  0.2 - 1.2 mg/dL Final   Alkaline Phosphatase 09/27/2022 41  39 - 117  U/L Final   AST 09/27/2022 23  0 - 37 U/L Final   ALT 09/27/2022 29  0 - 35 U/L Final   Total Protein 09/27/2022 7.0  6.0 - 8.3 g/dL Final   Albumin 89/97/7976 4.2  3.5 - 5.2 g/dL Final   GFR 89/97/7976 66.61  >60.00 mL/min Final   Calcium  09/27/2022 9.4  8.4 - 10.5 mg/dL Final   Amylase 89/97/7976 45  27 - 131 U/L Final   Lipase 09/27/2022 31.0  11.0 - 59.0 U/L Final   LACTIC ACID 09/27/2022 1.2  0.4 - 1.8 mmol/L Final   Color, Urine 09/27/2022 YELLOW  Yellow;Lt. Yellow;Straw;Dark Yellow;Amber;Green;Red;Brown Final   APPearance 09/27/2022 CLEAR  Clear;Turbid;Slightly Cloudy;Cloudy Final   Specific Gravity, Urine 09/27/2022 1.025  1.000 - 1.030 Final   pH 09/27/2022 5.5  5.0 - 8.0 Final   Total Protein, Urine 09/27/2022 NEGATIVE  Negative Final   Urine Glucose 09/27/2022 NEGATIVE  Negative Final   Ketones, ur 09/27/2022 TRACE (A)  Negative Final   Bilirubin Urine 09/27/2022 NEGATIVE  Negative Final   Hgb urine dipstick 09/27/2022 SMALL (A)  Negative Final   Urobilinogen, UA 09/27/2022 0.2  0.0 - 1.0 Final   Leukocytes,Ua 09/27/2022 NEGATIVE  Negative Final   Nitrite 09/27/2022 NEGATIVE  Negative Final   WBC, UA 09/27/2022 0-2/hpf  0-2/hpf Final   RBC / HPF 09/27/2022 3-6/hpf (A)  0-2/hpf Final  There may be more visits with results that are not included.  No image results found. No results found.       ASSESSMENT & PLAN   Assessment & Plan OSA on CPAP Obstructive sleep apnea with suboptimal CPAP response and associated memory loss   Obstructive sleep apnea is contributing to memory loss, with suboptimal CPAP response. She is adjusting to nasal prongs for CPAP. Discussed potential procedures to improve CPAP efficacy or eliminate its need, including a tongue pacemaker. Emphasized managing sleep apnea to prevent dementia. Zepbound  is discussed as a superior option to Wegovy  for weight loss and sleep apnea management, with fewer side effects and FDA approval for sleep apnea. Prescribe  Zepbound  to aid in weight loss and improve sleep apnea management. Advocate for Zepbound  approval using sleep study and memory concerns as leverage. Consider ENT referral for nasal evaluation and potential coblation surgery to improve nasal airflow. 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 Sinus headaches and nasal congestion affect CPAP efficacy. Discussed sinus anatomy and importance of drainage to alleviate symptoms. Consider surgical intervention if conservative measures fail. Coblation surgery is a potential intervention to improve nasal airflow and alleviate symptoms. Prescribe saline nasal spray for sinus irrigation two to three times daily and Flonase  once daily to reduce nasal swelling. Refer to ENT for evaluation of nasal anatomy and potential coblation surgery. Provide sinus toilet regimen instructions. Allergic rhinitis, unspecified seasonality, unspecified trigger  Obesity (BMI 30-39.9) Obesity with weight management therapy   Obesity with interest in weight management therapy. Previous weight loss success with medication, but insurance coverage issues halted treatment. Discussed Zepbound  as a potential option for weight loss and its benefits for sleep apnea and memory. Zepbound  is preferred over Wegovy  due to better outcomes for sleep apnea and memory concerns. Prescribe Zepbound  for weight management, pending insurance approval. Schedule follow-up to assess weight management progress and adjust treatment as needed.  Follow-Up   Follow-up needed to assess treatment efficacy and continue insurance advocacy for Zepbound . Schedule follow-up appointment in one month to evaluate sinus treatment efficacy and Zepbound  approval status. Ensure ENT appointment is scheduled and follow up if not contacted within  two weeks.  ORDER ASSOCIATIONS  #   DIAGNOSIS / CONDITION ICD-10 ENCOUNTER ORDER     ICD-10-CM   1. Allergic rhinitis, unspecified seasonality, unspecified trigger  J30.9 fluticasone  (FLONASE ) 50 MCG/ACT nasal spray    Saline (SIMPLY SALINE) 0.9 % AERS    loratadine  (CLARITIN ) 10 MG tablet    pseudoephedrine  (SUDAFED 12 HOUR) 120 MG 12 hr tablet    amoxicillin -clavulanate (AUGMENTIN ) 875-125 MG tablet    2. Chronic pansinusitis  J32.4 fluticasone  (FLONASE ) 50 MCG/ACT nasal spray    Saline (SIMPLY SALINE) 0.9 % AERS    loratadine  (CLARITIN ) 10 MG tablet    pseudoephedrine  (SUDAFED 12 HOUR) 120 MG 12 hr tablet    amoxicillin -clavulanate (AUGMENTIN ) 875-125 MG tablet    Ambulatory referral to ENT    3. OSA on CPAP  G47.33 Ambulatory referral to ENT    tirzepatide  (ZEPBOUND ) 2.5 MG/0.5ML Pen  4. Obesity (BMI 30-39.9)  E66.9          Orders Placed in Encounter:   Meds ordered this encounter  Medications   fluticasone  (FLONASE ) 50 MCG/ACT nasal spray    Sig: Place 2 sprays into both nostrils daily.    Dispense:  16 g    Refill:  6   Saline (SIMPLY SALINE) 0.9 % AERS    Sig: Place 2 each into the nose as directed. Use nightly for sinus hygiene long-term.  Can also be used as many times daily as desired to assist with clearing congested sinuses.    Dispense:  127 mL    Refill:  11   loratadine  (CLARITIN ) 10 MG tablet    Sig: Take 1 tablet (10 mg total) by mouth daily.    Dispense:  30 tablet    Refill:  11   pseudoephedrine  (SUDAFED 12 HOUR) 120 MG 12 hr tablet    Sig: Take 1 tablet (120 mg total) by mouth 2 (two) times daily.    Dispense:  20 tablet    Refill:  0   amoxicillin -clavulanate (AUGMENTIN ) 875-125 MG tablet    Sig: Take 1 tablet by mouth 2 (two) times daily.    Dispense:  20 tablet    Refill:  0   tirzepatide  (ZEPBOUND ) 2.5 MG/0.5ML Pen    Sig: Inject 2.5 mg into the skin once a week.    Dispense:  2 mL    Refill:  11   Orders Placed This Encounter   Procedures   Ambulatory referral to ENT    Referral Priority:   Routine    Referral Type:   Consultation    Referral Reason:   Specialty Services Required    Requested Specialty:   Otolaryngology    Number of Visits Requested:   1     This document was synthesized by artificial intelligence ([employer]) using HIPAA-compliant recording of the clinical interaction;   We discussed the use of AI scribe software for clinical note transcription with the patient, who gave verbal consent to proceed. additional Info: This encounter employed state-of-the-art, real-time, collaborative documentation. The patient actively reviewed and assisted in updating their electronic medical record on a shared screen, ensuring transparency and facilitating joint problem-solving for the problem list, overview, and plan. This approach promotes accurate, informed care. The treatment plan was discussed and reviewed in detail, including medication safety, potential side effects, and all patient questions. We confirmed understanding and comfort with the plan. Follow-up instructions were established, including contacting the office for any concerns, returning if symptoms worsen, persist, or new symptoms develop, and precautions for potential emergency department visits.

## 2024-08-23 NOTE — Patient Instructions (Addendum)
 It was a pleasure seeing you today! Your health and satisfaction are our top priorities.  Bernardino Cone, MD  VISIT SUMMARY: During your visit, we discussed your concerns about weight loss, memory issues, and sinus headaches. We reviewed your history of sleep apnea and the adjustments you are making with your CPAP machine. We also talked about your past nasal surgery and its impact on your current symptoms. Additionally, we explored potential treatments for your weight management and sinus issues.  YOUR PLAN: -OBSTRUCTIVE SLEEP APNEA WITH SUBOPTIMAL CPAP RESPONSE AND ASSOCIATED MEMORY LOSS: Obstructive sleep apnea is a condition where your airway becomes blocked during sleep, causing breathing interruptions. This can contribute to memory loss. You are adjusting to using nasal prongs for your CPAP machine. We discussed potential procedures to improve your CPAP efficacy or eliminate its need, such as a tongue pacemaker. Managing your sleep apnea is crucial to prevent dementia. We also talked about Zepbound  as a superior option for weight loss and sleep apnea management. We will prescribe Zepbound  and advocate for its approval using your sleep study and memory concerns as leverage. An ENT referral is considered for nasal evaluation and potential coblation surgery to improve nasal airflow.  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 Your prior nasal surgery has led to nasal airway obstruction. This condition is causing sinus headaches and nasal congestion, affecting your CPAP efficacy. We discussed the importance of sinus drainage and potential surgical interventions if conservative measures fail. You are prescribed saline nasal spray for sinus irrigation two to three times daily and Flonase  once  daily to reduce nasal swelling. An ENT referral is made for evaluation of your nasal anatomy and potential coblation surgery.  -OBESITY WITH WEIGHT MANAGEMENT THERAPY: Obesity is a condition where excess body fat may affect your health. You have shown interest in weight management therapy and had previous success with medication. We discussed Zepbound  as a potential option for weight loss, which also benefits sleep apnea and memory. Zepbound  is preferred over Wegovy  due to better outcomes. We will prescribe Zepbound  for weight management, pending insurance approval. A follow-up will be scheduled to assess your progress and adjust treatment as needed.  -HYPERTENSION: Hypertension is high blood pressure, which can lead to serious health issues if not managed. Your blood pressure was elevated during the visit. We will continue to monitor and manage your blood pressure to ensure it remains under control.  -THORACIC AORTIC ANEURYSM AND AORTIC ATHEROSCLEROSIS: A thoracic aortic aneurysm is an abnormal bulge in the wall of the aorta, the main artery carrying blood from the heart. Aortic atherosclerosis is the buildup of fats, cholesterol, and ot her substances in and on the artery walls. These conditions require careful monitoring and management to prevent complications.  INSTRUCTIONS: Please schedule a follow-up appointment in one month to evaluate the efficacy of your sinus treatment and the approval status of Zepbound . Ensure that you have an ENT appointment scheduled and follow up if you are not contacted within two weeks.  Your Providers PCP: Cone Bernardino MATSU, MD,  [phone]) Referring Provider: Cone Bernardino MATSU, MD,  [phone]) Care Team Provider: Gordan Bergeron, MD,  [phone]) Care Team Provider: Irven Ozell DEL, MD,  [phone]) Care Team Provider: Eda Baxter, MD,  [phone]) Care Team Provider: Sheril Coy, MD,  [phone]) Care Team Provider: Murray Drivers, DDS,   (  [phone]) Care Team Provider: Nadra, Hritz, PT,  [phone]) Care Team Provider: Cesario Boer, MD,  [phone]) Care Team Provider: Beather Delon Gibson, [place],  [phone]) Care Team Provider: Kallie Manuelita BROCKS, MD,  [phone]) Care Team Provider: Buck Saucer, MD,  [phone]) Care Team Provider: Jeffrie Oneil BROCKS, MD,  [phone])  NEXT STEPS: [x]  Early Intervention: Schedule sooner appointment, call our on-call services, or go to emergency room if there is any significant Increase in pain or discomfort New or worsening symptoms Sudden or severe changes in your health [x]  Flexible Follow-Up: We recommend a No follow-ups on file. for optimal routine care. This allows for progress monitoring and treatment adjustments. [x]  Preventive Care: Schedule your annual preventive care visit! It's typically covered by insurance and helps identify potential health issues early. [x]  Lab & X-ray Appointments: Incomplete tests scheduled today, or call to schedule. X-rays: [facility] (M-F, 8:30am-noon or 1pm-5pm). [x]  Medical Information Release: Sign a release form at front desk to obtain relevant medical information we don't have.  MAKING THE MOST OF OUR FOCUSED 20 MINUTE APPOINTMENTS: [x]   Clearly state your top concerns at the beginning of the visit to focus our discussion [x]   If you anticipate you will need more time, please inform the front desk during scheduling - we can book multiple appointments in the same week. [x]   If you have transportation problems- use our convenient video appointments or ask about transportation support. [x]   We can get down to business faster if you use MyChart to update information before the visit and submit non-urgent questions before your visit. Thank you for taking the time to provide details through MyChart.  Let our nurse know and she can import this information into your encounter documents.  Arrival and Wait Times: [x]    Arriving on time ensures that everyone receives prompt attention. [x]   Early morning (8a) and afternoon (1p) appointments tend to have shortest wait times. [x]   Unfortunately, we cannot delay appointments for late arrivals or hold slots during phone calls.  Getting Answers and Following Up [x]   Simple Questions & Concerns: For quick questions or basic follow-up after your visit, reach us  at [phone] or MyChart messaging. [x]   Complex Concerns: If your concern is more complex, scheduling an appointment might be best. Discuss this with the staff to find the most suitable option. [x]   Lab & Imaging Results: We'll contact you directly if results are abnormal or you don't use MyChart. Most normal results will be on MyChart within 2-3 business days, with a review message from Dr. Jesus. Haven't heard back in 2 weeks? Need results sooner? Contact us  at [phone]. [x]   Referrals: Our referral coordinator will manage specialist referrals. The specialist's office should contact you within 2 weeks to schedule an appointment. Call us  if you haven't heard from them after 2 weeks.  Staying Connected [x]   MyChart: Activate your MyChart for the fastest way to access results and message us . See the last page of this paperwork for instructions on how to activate.  Bring to Your Next Appointment [x]   Medications: Please bring all your medication bottles to your next appointment to ensure we have an accurate record of your prescriptions. [x]   Health Diaries: If you're monitoring any health conditions at home, keeping a diary of your readings can be very helpful for discussions at your next appointment.  Billing [x]   X-ray & Lab Orders: These are billed by separate companies. Contact the invoicing company directly for questions or concerns. [  x]  Visit Charges: Discuss any billing inquiries with our administrative services team.  Your Satisfaction Matters [x]   Share Your Experience: We strive for your  satisfaction! If you have any complaints, or preferably compliments, please let Dr. Jesus know directly or contact our Practice Administrators, Manuelita Rubin or [employer], by asking at the front desk.   Reviewing Your Records [x]   Review this early draft of your clinical encounter notes below and the final encounter summary tomorrow on MyChart after its been completed.  All orders placed so far are visible here: OSA on CPAP -     Tirzepatide -Weight Management; Inject 2.5 mg into the skin once a week.  Dispense: 2 mL; Refill: 11 -     Ambulatory referral to ENT  Chronic pansinusitis -     Fluticasone  Propionate; Place 2 sprays into both nostrils daily.  Dispense: 16 g; Refill: 6 -     Simply Saline; Place 2 each into the nose as directed. Use nightly for sinus hygiene long-term.  Can also be used as many times daily as desired to assist with clearing congested sinuses.  Dispense: 127 mL; Refill: 11 -     Loratadine ; Take 1 tablet (10 mg total) by mouth daily.  Dispense: 30 tablet; Refill: 11 -     Pseudoephedrine  HCl ER; Take 1 tablet (120 mg total) by mouth 2 (two) times daily.  Dispense: 20 tablet; Refill: 0 -     Amoxicillin -Pot Clavulanate; Take 1 tablet by mouth 2 (two) times daily.  Dispense: 20 tablet; Refill: 0 -     Ambulatory referral to ENT  Allergic rhinitis, unspecified seasonality, unspecified trigger -     Fluticasone  Propionate; Place 2 sprays into both nostrils daily.  Dispense: 16 g; Refill: 6 -     Simply Saline; Place 2 each into the nose as directed. Use nightly for sinus hygiene long-term.  Can also be used as many times daily as desired to assist with clearing congested sinuses.  Dispense: 127 mL; Refill: 11 -     Loratadine ; Take 1 tablet (10 mg total) by mouth daily.  Dispense: 30 tablet; Refill: 11 -     Pseudoephedrine  HCl ER; Take 1 tablet (120 mg total) by mouth 2 (two) times daily.  Dispense: 20 tablet; Refill: 0 -     Amoxicillin -Pot Clavulanate; Take 1  tablet by mouth 2 (two) times daily.  Dispense: 20 tablet; Refill: 0  Obesity (BMI 30-39.9) Assessment & Plan: Obesity with weight management therapy   Obesity with interest in weight management therapy. Previous weight loss success with medication, but insurance coverage issues halted treatment. Discussed Zepbound  as a potential option for weight loss and its benefits for sleep apnea and memory. Zepbound  is preferred over Wegovy  due to better outcomes for sleep apnea and memory concerns. Prescribe Zepbound  for weight management, pending insurance approval. Schedule follow-up to assess weight management progress and adjust treatment as needed.  Orders: -     Tirzepatide -Weight Management; Inject 2.5 mg into the skin once a week.  Dispense: 2 mL; Refill: 11          ALLERGY MANAGEMENT PLAN  This plan is designed to help manage your allergic rhinitis (nasal allergies) effectively. Follow these steps daily for best results.  Sinus saline sprays- use nightly, and after sneezing episodes or exposure to allergen.  Insert deeply and spray mist into nose while leaning over sink at 45 degrees,  while gently breathing. Also blow out onto tissue while leaning forward 45  degrees. Once daily, after a sinus rinse, use sensimist.  Just before bedtime is best. This only needed if allergies acting up.  If this is inadequate add-on once daily for levocetirizine / xyzal  5 mg for nondrowsy antihistamine Take benadryl  25 mg at bedtime also if allergic mucus is persisting  When allergies cause chronic swelling in sinuses, it leads to sinus infections:    DAILY TREATMENT ROUTINE   Time of Day Treatment Steps  Morning 1. Saline Nasal Spray - Use to cleanse nasal passages 2. Xyzal  (levocetirizine) - Take one tablet daily   Throughout Day Saline Nasal Spray - Use 2 additional times (mid-day and afternoon)   Evening/Bedtime 1. Saline Nasal Rinse - Thoroughly clean nasal passages 2. Flonase  Sensimist -  Apply after nasal rinse 3. Benadryl  (diphenhydramine ) - Take 25mg  if experiencing persistent congestion    PROPER TECHNIQUE GUIDE       Saline Nasal Spray/Rinse Technique: Lean forward over sink at a 45-degree angle Turn head slightly to one side Insert spray tip into upper nostril Spray gently while breathing lightly through your nose Repeat on other side Gently blow nose to clear excess solution Use saline spray 3 times daily to keep nasal passages moist and clear allergens.       Flonase  Sensimist Technique: Shake bottle gently before each use Prime the bottle if it's new or hasn't been used for a week Tilt your head forward slightly Insert tip into nostril, pointing away from the center of your nose Spray while inhaling gently Repeat in other nostril Use Flonase  Sensimist once daily, preferably at bedtime after using saline rinse. It may take several days of regular use to feel maximum benefit.   WHY FLONASE  SENSIMIST?   Benefits of Flonase  Sensimist:  Alcohol-free and scent-free formula - gentler on sensitive nasal passages Fine mist application - more comfortable with less dripping down throat Effectively relieves nasal congestion, sneezing, runny nose, and even eye symptoms 24-hour relief with once-daily dosing Uses a more potent form of fluticasone  that works at a lower dose Less liquid per spray means less discomfort  UNDERSTANDING YOUR MEDICATIONS   Medication How It Works Important Notes  Flonase  Sensimist (fluticasone  furoate) Reduces inflammation in nasal passages, addressing the underlying cause of allergy symptoms - Takes several days for full effect - Use daily for best results - Safe for long-term use   Xyzal  (levocetirizine) Blocks histamine to reduce allergy symptoms like sneezing and itching - Take at the same time each day - May cause drowsiness in some people - Once-daily dosing   Benadryl  (diphenhydramine ) Antihistamine that provides additional  relief for breakthrough symptoms - Causes drowsiness - Use only at bedtime - For occasional use when needed   Saline Spray/Rinse Physically removes allergens and moistens nasal passages - Safe to use frequently - Improves effectiveness of other treatments - Reduces nasal irritation    CONTACT YOUR PROVIDER IF: Your symptoms do not improve after 1-2 weeks of following this plan You develop sinus pain with fever or green/yellow discharge You experience frequent nosebleeds You develop new or worsening symptoms You have questions about your treatment plan     ADDITIONAL ALLERGY MANAGEMENT TIPS   HELPFUL STRATEGIES: ?? Keep windows closed during high pollen seasons ??? Use allergen-proof covers for pillows and mattresses ?? Vacuum regularly with a HEPA filter vacuum ?? Shower and change clothes after spending time outdoors ?? Check local pollen counts and limit outdoor time when counts are high ?? Stay well-hydrated to help keep mucous  membranes moist     Nasal Obstruction and Turbinate Reduction This information is provided to help you understand nasal obstruction caused by enlarged turbinates and the treatment options available with ENT specialists. Understanding Your Nasal Passages     Nasal turbinates are structures inside your nose that help warm, filter, and humidify the air you breathe. When these become enlarged (turbinate hypertrophy), they can block airflow and cause significant breathing problems.  Do You Have These Symptoms? Common Symptoms Impact on Daily Life  ? Chronic nasal congestion or stuffiness ? Difficulty breathing through your nose ? Breathing through your mouth, especially at night ? Snoring or disrupted sleep ? Recurrent sinus infections ? Reduced sense of smell or taste  ? Sleep disturbances or fatigue ? Dry mouth upon waking ? Headaches, especially in morning ? Difficulty with physical activity ? Persistent use of nasal sprays with limited relief ?  Decreased quality of life   Common Causes    Allergies (seasonal or year-round)    Environmental irritants (smoke, pollution)    Chronic sinusitis    Anatomical variations    Medication side effects When Medical Management Isn't Enough First-Line Treatments Second-Line Treatments Specialist Treatments   Nasal saline irrigation  Nasal steroid sprays  Antihistamines  Allergy management  Environmental controls   Prescription nasal sprays  Oral steroids (short course)  Immunotherapy  Extended allergy testing  Sleep positioning aids   Nasal endoscopy evaluation  Coblation turbinate reduction  Other turbinate procedures  Septoplasty (if needed)  Complete airway assessment   About Coblation Turbinate Reduction PROCEDURE OVERVIEW: Coblation Turbinate Reduction is a minimally invasive procedure performed by ENT specialists to reduce enlarged nasal turbinates. Unlike traditional surgery, coblation uses low-temperature radiofrequency energy with saline to gently reduce tissue size while preserving the important functions of the turbinates.    Benefits Patients Typically Experience Benefit What to Realistically Expect  Improved Nasal Breathing Most patients report significant improvement in their ability to breathe through the nose, especially at night. The degree of improvement varies by individual, with many experiencing 70-90% improvement in nasal airflow.  Better Sleep Quality Patients often report reduced snoring, less nighttime awakenings, and feeling more rested in the morning. Partners frequently notice the difference in sleep breathing patterns.  Reduced Medication Dependence Many patients are able to reduce their reliance on nasal sprays and decongestants. Some may still need occasional use during allergy seasons or upper respiratory infections.  Improved Exercise Tolerance Better nasal breathing often translates to improved comfort during physical activity. Patients frequently  report they no longer need to gasp for air through their mouth during exercise.  Decreased Sinus Pressure Patients with chronic sinus pressure or fullness often experience relief as the improved airflow allows better sinus drainage. This may reduce the frequency of sinus infections for some patients.  Convenience Factors The procedure is typically performed in-office under local anesthesia, takes only 10-15 minutes, and most patients return to normal activities within 1-2 days. No external incisions are made.  Patient Experiences     For years, I couldn't breathe through my nose, especially at night. After the procedure, I noticed improvement within a couple of weeks. Now I can actually sleep without a mouth guard and wake up without a dry throat. It wasn't a miracle cure--I still have some congestion during allergy season--but the difference in my daily quality of life has been substantial. -- Patient, 40, procedure performed 14 months ago       The procedure itself was quick and only mildly  uncomfortable. Recovery was easier than I expected--just some stuffiness for a few days. The biggest change has been during exercise. I can finally breathe through my nose while running, which has made my workouts much more comfortable. -- Patient, 27, procedure performed 8 months ago  Timeline of Improvement First Week Initial healing phase. Some congestion and mild discomfort is normal. Patients may not notice significant breathing improvement yet.  2-3 Weeks Most patients begin to notice improved breathing as initial swelling subsides. This is when many first experience the benefits of the procedure.  1-2 Months Continued improvement as internal healing completes. The majority of patients experience their maximum benefit during this period.  Long-term Benefits typically last several years. Some patients may experience gradual return of symptoms over time, particularly if allergies or other underlying  causes are not well controlled.  What to Expect After Referral Initial ENT Visit Evaluation Procedure (if appropriate) Follow-up   Discussion of symptoms  Medical history review  Physical examination  Nasal endoscopy   Assessment of turbinate size  Airflow evaluation  Rule out other causes  Treatment options discussed   Local anesthesia  Brief in-office procedure  Minimal discomfort  Same-day return home   1-2 week checkup  Assessment of improvement  Additional care if needed  Long-term management plan   Recovery Process RECOVERY EXPECTATIONS: Most patients experience mild congestion for a few days after the procedure. Nasal saline sprays and avoiding strenuous activity for 1 week are typically recommended. Full benefits may take 2-4 weeks as healing completes.  When to Consider Specialist Referral Consider ENT Referral If:   Nasal obstruction persists despite 4-6 weeks of medical therapy  Patient requires daily nasal decongestants (risk of rebound congestion)  Symptoms significantly impact sleep, daily activities, or quality of life  Physical examination shows significant turbinate hypertrophy  Patient experiences recurrent sinusitis (3+ episodes per year)  Chronic mouth breathing is causing dental or oral issues   Insurance Information Most insurance plans cover this procedure when medically necessary. Prior authorization may be required. Our office staff can help coordinate referrals and insurance verification. Patient Questions & Concerns Is the procedure painful? Most patients report minimal discomfort during and after the procedure. Will I need to miss work? Most patients return to work within 1-2 days.[email or web address] How long do results last? Results typically last several years, though some patients may need follow-up treatment. Will my insurance cover it? Most insurance plans cover this procedure when medically necessary. Important Warning  Signs CONTACT YOUR ENT SPECIALIST IMMEDIATELY IF YOU EXPERIENCE: Heavy bleeding that doesn't stop with gentle pressure Severe pain not controlled by prescribed medication Signs of infection (fever, increasing pain, foul discharge) Difficulty breathing     REFERRAL INFORMATION   Referral Process Our office will submit the referral electronically. Please allow 3-5 business days for the ENT office to contact you for scheduling.   What to [employer] of current medications  Previous imaging results (if any)  Allergy testing results (if completed)  Diary of failed efforts to improve nasal airflow.  Designer, fashion/clothing of Otolaryngology: [email or web address]    Allergy & Asthma Network: www.allergyasthmanetwork.org    American Sleep Association: www.sleepassociation.org

## 2024-08-23 NOTE — Assessment & Plan Note (Signed)
 Obesity with weight management therapy   Obesity with interest in weight management therapy. Previous weight loss success with medication, but insurance coverage issues halted treatment. Discussed Zepbound  as a potential option for weight loss and its benefits for sleep apnea and memory. Zepbound  is preferred over Wegovy  due to better outcomes for sleep apnea and memory concerns. Prescribe Zepbound  for weight management, pending insurance approval. Schedule follow-up to assess weight management progress and adjust treatment as needed.

## 2024-08-24 ENCOUNTER — Telehealth: Payer: Self-pay

## 2024-08-24 DIAGNOSIS — E669 Obesity, unspecified: Secondary | ICD-10-CM

## 2024-08-24 DIAGNOSIS — I739 Peripheral vascular disease, unspecified: Secondary | ICD-10-CM

## 2024-08-24 DIAGNOSIS — I712 Thoracic aortic aneurysm, without rupture, unspecified: Secondary | ICD-10-CM

## 2024-08-24 DIAGNOSIS — I700 Atherosclerosis of aorta: Secondary | ICD-10-CM

## 2024-08-24 DIAGNOSIS — I10 Essential (primary) hypertension: Secondary | ICD-10-CM

## 2024-08-24 NOTE — Telephone Encounter (Signed)
 Pharmacy Patient Advocate Encounter   Received notification from Onbase that prior authorization for Zepbound  2.5MG /0.5ML pen-injectors is required/requested.   Insurance verification completed.   The patient is insured through [facility] .   Per test claim: PA required; PA submitted to above mentioned insurance via Latent Key/confirmation #/EOC [facility] Status is pending

## 2024-08-28 NOTE — Telephone Encounter (Signed)
 Pharmacy Patient Advocate Encounter  Received notification from [facility] that Prior Authorization for ZEPBOUND  AUTO-INJECTORS has been DENIED.  Full denial letter will be uploaded to the media tab. See denial reason below.   PA #/Case ID/Reference #: [id number]

## 2024-08-31 ENCOUNTER — Other Ambulatory Visit (HOSPITAL_COMMUNITY): Payer: Self-pay

## 2024-08-31 ENCOUNTER — Telehealth: Payer: Self-pay

## 2024-08-31 NOTE — Telephone Encounter (Signed)
 Pharmacy Patient Advocate Encounter   Received notification from Pt Calls Messages that prior authorization for Wegovy  0.25MG /0.5ML auto-injectors is required/requested.   Insurance verification completed.   The patient is insured through [facility] .   Per test claim: PA required; PA submitted to above mentioned insurance via Latent Key/confirmation #/EOC [employer] Status is pending

## 2024-08-31 NOTE — Telephone Encounter (Signed)
-  Additional info submitted to fax [phone]

## 2024-09-02 MED ORDER — WEGOVY 0.25 MG/0.5ML ~~LOC~~ SOAJ: 0.2500 mg | SUBCUTANEOUS | 2 refills | Status: DC

## 2024-09-02 NOTE — Addendum Note (Signed)
 Addended by: Chirag Krueger G on: 09/02/2024 11:52 AM   Modules accepted: Orders

## 2024-09-03 ENCOUNTER — Other Ambulatory Visit (HOSPITAL_COMMUNITY): Payer: Self-pay

## 2024-09-03 ENCOUNTER — Telehealth: Payer: Self-pay

## 2024-09-03 NOTE — Telephone Encounter (Signed)
 Spoke with pt in office today gave her [facility] health E[facility] number for her to call where here referral was sent to

## 2024-09-03 NOTE — Telephone Encounter (Signed)
 Pharmacy Patient Advocate Encounter  Received notification from [facility] that Prior Authorization for Wegovy  0.25MG /0.5ML auto-injectors  has been APPROVED from 12/28/23 to 08/31/25   PA #/Case ID/Reference #: [id number]

## 2024-09-10 ENCOUNTER — Other Ambulatory Visit: Payer: Self-pay

## 2024-09-10 ENCOUNTER — Telehealth: Payer: Self-pay

## 2024-09-10 DIAGNOSIS — I712 Thoracic aortic aneurysm, without rupture, unspecified: Secondary | ICD-10-CM

## 2024-09-10 DIAGNOSIS — I739 Peripheral vascular disease, unspecified: Secondary | ICD-10-CM

## 2024-09-10 DIAGNOSIS — I700 Atherosclerosis of aorta: Secondary | ICD-10-CM

## 2024-09-10 DIAGNOSIS — E669 Obesity, unspecified: Secondary | ICD-10-CM

## 2024-09-10 DIAGNOSIS — I10 Essential (primary) hypertension: Secondary | ICD-10-CM

## 2024-09-10 MED ORDER — WEGOVY 0.25 MG/0.5ML ~~LOC~~ SOAJ: 0.2500 mg | SUBCUTANEOUS | 2 refills | Status: DC

## 2024-09-10 NOTE — Telephone Encounter (Signed)
 Copied from CRM #[id number]. Topic: Clinical - Medication Question >> Sep 10, 2024 10:27 AM Berneda FALCON wrote: Reason for CRM: Pt would like a nurse to call her back please. Pt refused to state the reason for the call, just stated it was about a prescription, but refused to give further detail when prompted.  Please call patient back at [phone]  Spoke with pt about medication al ready taken care of

## 2024-09-10 NOTE — Telephone Encounter (Signed)
 Copied from CRM #[id number]. Topic: Clinical - Prescription Issue >> Sep 10, 2024 10:36 AM Martha Orr wrote: Reason for CRM: Pt refused initially in the previous CRM to explain the need for the call, and requested a nurse call her back. After sending the CRM, she did divulge the reason is needing the semaglutide -weight management (WEGOVY ) 0.25 MG/0.5ML SOAJ SQ injection send to the [facility] phamracy instead of the [facility] pharmacy please. She is going to [place] today and would like this done today if possible.  [id number] - [place], [place] - [address]  [place] [address] Phone: [phone] Fax: [phone] Hours: Not open 24 hours  Spoke with pt about wegovy  insurance wants pt to try wegovy  before zepbound  wegovy  sent in per provider.

## 2024-09-12 ENCOUNTER — Other Ambulatory Visit: Payer: Self-pay | Admitting: Internal Medicine

## 2024-09-18 ENCOUNTER — Ambulatory Visit (INDEPENDENT_AMBULATORY_CARE_PROVIDER_SITE_OTHER): Admitting: Otolaryngology

## 2024-09-18 ENCOUNTER — Encounter (INDEPENDENT_AMBULATORY_CARE_PROVIDER_SITE_OTHER): Payer: Self-pay | Admitting: Otolaryngology

## 2024-09-18 VITALS — BP 96/62 | HR 89 | Ht 66.0 in | Wt 182.0 lb

## 2024-09-18 DIAGNOSIS — J3489 Other specified disorders of nose and nasal sinuses: Secondary | ICD-10-CM | POA: Diagnosis not present

## 2024-09-18 DIAGNOSIS — J342 Deviated nasal septum: Secondary | ICD-10-CM | POA: Diagnosis not present

## 2024-09-18 DIAGNOSIS — J3089 Other allergic rhinitis: Secondary | ICD-10-CM

## 2024-09-18 DIAGNOSIS — R0981 Nasal congestion: Secondary | ICD-10-CM

## 2024-09-18 DIAGNOSIS — J343 Hypertrophy of nasal turbinates: Secondary | ICD-10-CM

## 2024-09-18 DIAGNOSIS — J0181 Other acute recurrent sinusitis: Secondary | ICD-10-CM

## 2024-09-18 MED ORDER — AZELASTINE HCL 0.1 % NA SOLN: 2.0000 | Freq: Every day | NASAL | 12 refills | Status: AC

## 2024-09-18 MED ORDER — DOXYCYCLINE HYCLATE 100 MG PO TABS: 100.0000 mg | ORAL_TABLET | Freq: Two times a day (BID) | ORAL | 0 refills | Status: AC

## 2024-09-18 MED ORDER — PREDNISONE 10 MG PO TABS: ORAL_TABLET | ORAL | 0 refills | Status: AC

## 2024-09-18 NOTE — Patient Instructions (Addendum)
 I have ordered an imaging study for you to complete prior to your next visit. Please call [facility] Scheduling at [phone] to schedule your imaging if you have not received a call within 24 hours. If you are unable to complete your imaging study prior to your next scheduled visit please call our office to let us  know.   Take Prednisone  by mouth (PO) 30mg  x 4 days (3 pills in morning), then 20mg  x4 days (2 pills), then 10mg  x 4 days (1 pill), then stop. Risks discussed -- take in morning Take doxycycline  100mg  tablet twice daily for 7 days  Use two sprays of flonase  in each nostril nightly; right after, use astelin  spray two sprays each nostril nightly --- you can also put 4 of each spray in the bottle and then flush with it.   Aureliano Med Nasal Saline Rinse - use daily; stop 3 days before CT scan - start nasal saline rinses with NeilMed Bottle available over the counter    Nasal Saline Irrigation instructions: If you choose to make your own salt water  solution, You will need: Salt (kosher, canning, or pickling salt) Baking soda Nasal irrigation bottle (i.e. Aureliano Med Sinus Rinse) Measuring spoon ( teaspoon) Distilled / boiled water    Mix solution Mix 1 teaspoon of salt, 1/2 teaspoon of baking soda and 1 cup of water  into irrigation bottle ** May use saline packet instead of homemade recipe for this step if you prefer If medicine was prescribed to be mixed with solution, place this into bottle Examples 2 inches of 2% mupirocin ointment Budesonide solution Position your head: Lean over sink (about 45 degrees) Rotate head (about 45 degrees) so that one nostril is above the other Irrigate Insert tip of irrigation bottle into upper nostril so it forms a comfortable seal Irrigate while breathing through your mouth May remove the straw from the bottle in order to irrigate the entire solution (important if medicine was added) Exhale through nose when finished and blow nose  as necessary  Repeat on opposite side with other 1/2 of solution (120 mL) or remake solution if all 240 mL was used on first side Wash irrigation bottle regularly, replace every 3 months

## 2024-09-18 NOTE — Progress Notes (Signed)
 Dear Dr. Jesus, Here is my assessment for our mutual patient, Martha Orr. Thank you for allowing me the opportunity to care for your patient. Please do not hesitate to contact me should you have any other questions. Sincerely, Dr. Eldora Blanch  Otolaryngology Clinic Note  HISTORY: Martha Orr is a 68 y.o. female kindly referred by Dr. Jesus for evaluation of OSA and sinusitis  Initial visit (08/2024): She reports that she has chronic sinus issues for which symptoms include anterior rhinorrhea, sniffing, congestion and lot of mucous production. She also describes some headaches. She has had these symptoms for many years, but just lived with it. Symptoms are perennial.   She does have history of sinus infections -- symptoms include malaise, maxillary pressure, and headaches, mild discolored drainage -- for which she does get antibiotics. Her last course of antibiotics was in late August (Augmentin ), which did not help. She does not know how many rounds of antibiotics she gets in a year.   She has tried and is currently using flonase  (at night), and Allegra (PRN). She has not tried saline rinses. Unclear what causes improvement.  Allergy testing has been done long time ago - does not remember what she was allergic to. No previous sinus surgery.  She is using CPAP every night. Able to tolerate it most of the night.   She did have nasal surgery (~30 years ago) - notes she had a knot in her dorsum and she had it shaved down. Unclear if had septoplasty  GLP-1: yes AP/AC: no  Tobacco: no  H&N Surgery: tonsillectomy, nasal surgery  PMHx: Atherosclerosis, OSA on CPAP, HTN, Hypothyroidism, HLD, MDD/GAD, RLS(?), CAD  RADIOGRAPHIC EVALUATION AND INDEPENDENT REVIEW OF OTHER RECORDS:: Dr. Jesus (08/23/2024): OSA with suboptimal CPAP response; using CPAP and recently had sleep study; Chronic Sinusitis; noted prior nasal surgery, allergic rhinitis; Rx: saline spray, nasal irrigation  and flonase , augmentin ; ref to ENT Dr. Buck (04/17/2024) Sleep study interpreted: AHI 22.9, O2 nadir 74% Labs CBC and CMP and Ferritin 04/26/2024: BUN/Cr 17/0.72; WBC 5.7, Hgb 12.8, Eos 400, Ferritin wnl MRI Brain 03/25/2021 independently interpreted with respect to sinuses: septum mild dev right, mild MPT right frontal and scattered right ethmoid disease  Past Medical History:  Diagnosis Date   Abdominal pain, chronic, right lower quadrant 09/27/2022   68 year old female Abdominal pain across her entire right side but has remote removal of uterus, appendix, and gallbladder. Pain started 10/07/22.  Nausea and abdominal pain on the right, often occurs after eating.  Does not have any diarrhea or constipation.  Does not improve with stool softening.  Seems worse after large meal.  Partial response to bentyl .   Is not affected by urinating does have   Anxiety    Arthritis    Chronic back pain    buldging disc   Depression    takes Lexapro  daily   Family history of adverse reaction to anesthesia    sister gets sick after anesthesia   Gallstones    Hepatic steatosis 10/04/2022   High cholesterol    takes Crestor  daily   History of appendectomy 10/04/2022   History of cholecystectomy 10/04/2022   History of colon polyps    benign   History of hysterectomy 10/04/2022   History of shingles    Hypertension    Hypothyroidism    takes Synthroid  daily   Joint pain    Joint swelling    Microscopic hematuria    states her entire life and family is the same  way   Nausea 09/27/2022   Past Surgical History:  Procedure Laterality Date   ABDOMINAL HYSTERECTOMY  1998   APPENDECTOMY     BREAST BIOPSY     BUNIONECTOMY Bilateral    CARPAL TUNNEL RELEASE Bilateral    CHOLECYSTECTOMY     COLONOSCOPY     KNEE ARTHROSCOPY Right    KNEE ARTHROSCOPY Left 11/01/2017   Procedure: ARTHROSCOPY LEFT KNEE;  Surgeon: Sheril Coy, MD;  Location: [facility] OR;  Service: Orthopedics;  Laterality: Left;   knot  removed from right breast     LAPAROSCOPY N/A 04/15/2023   Procedure: LAPAROSCOPY DIAGNOSTIC;  Surgeon: Kallie Manuelita BROCKS, MD;  Location: [facility];  Service: General;  Laterality: N/A;   NASAL SINUS SURGERY     TONSILLECTOMY     TONSILLECTOMY AND ADENOIDECTOMY  2002   TOTAL KNEE ARTHROPLASTY Bilateral 10/12/2016   Procedure: TOTAL KNEE BILATERAL;  Surgeon: Coy Sheril, MD;  Location: [facility] OR;  Service: Orthopedics;  Laterality: Bilateral;   Family History  Problem Relation Age of Onset   Heart attack Mother    Hyperlipidemia Mother    Arthritis Father    Arthritis Sister    Sleep apnea Sister    Arthritis Sister    Heart attack Sister    Arthritis Sister    Hyperlipidemia Sister    Stroke Sister    Sleep apnea Sister    Sleep apnea Brother    Cancer Brother    Depression Brother    Heart attack Brother    Heart disease Brother    Hypertension Brother    Hyperlipidemia Brother    Cancer Brother    Heart attack Brother    Colon cancer Neg Hx    Esophageal cancer Neg Hx    Rectal cancer Neg Hx    Stomach cancer Neg Hx    Social History   Tobacco Use   Smoking status: Never   Smokeless tobacco: Never  Substance Use Topics   Alcohol use: Yes    Comment: occasionally   No Known Allergies Current Outpatient Medications  Medication Sig Dispense Refill   acetaminophen  (TYLENOL ) 325 MG tablet Take 325 mg by mouth every 6 (six) hours as needed for moderate pain.     amoxicillin -clavulanate (AUGMENTIN ) 875-125 MG tablet Take 1 tablet by mouth 2 (two) times daily. 20 tablet 0   azelastine  (ASTELIN ) 0.1 % nasal spray Place 2 sprays into both nostrils at bedtime. Use in each nostril as directed 30 mL 12   Biotin w/ Vitamins C & E (HAIR/SKIN/NAILS PO) Take 1 capsule by mouth daily.     cholecalciferol (VITAMIN D ) 1000 units tablet Take 1,000 Units by mouth at bedtime.     doxycycline  (VIBRA -TABS) 100 MG tablet Take 1 tablet (100 mg total) by mouth 2 (two) times daily for 7 days.  14 tablet 0   escitalopram  (LEXAPRO ) 20 MG tablet TAKE 1 TABLET DAILY 90 tablet 3   fluticasone  (FLONASE ) 50 MCG/ACT nasal spray Place 2 sprays into both nostrils daily as needed for allergies.     fluticasone  (FLONASE ) 50 MCG/ACT nasal spray Place 2 sprays into both nostrils daily. 16 g 6   Ginkgo Biloba Extract 60 MG CAPS Take 1 capsule by mouth daily.     hydrochlorothiazide  (MICROZIDE ) 12.5 MG capsule Take 1 capsule (12.5 mg total) by mouth daily. 90 capsule 3   icosapent  Ethyl (VASCEPA ) 1 g capsule Take 2 capsules (2 g total) by mouth 2 (two) times daily. 360 capsule 3  levothyroxine  (SYNTHROID ) 88 MCG tablet Take 1 tablet (88 mcg total) by mouth daily. 90 tablet 3   loratadine  (CLARITIN ) 10 MG tablet Take 1 tablet (10 mg total) by mouth daily. 30 tablet 11   predniSONE  (DELTASONE ) 10 MG tablet Take 3 tablets (30 mg total) by mouth daily with breakfast for 4 days, THEN 2 tablets (20 mg total) daily with breakfast for 4 days, THEN 1 tablet (10 mg total) daily with breakfast for 4 days. 24 tablet 0   pseudoephedrine  (SUDAFED 12 HOUR) 120 MG 12 hr tablet Take 1 tablet (120 mg total) by mouth 2 (two) times daily. 20 tablet 0   rosuvastatin  (CRESTOR ) 40 MG tablet Take 1 tablet (40 mg total) by mouth at bedtime. 90 tablet 3   Saline (SIMPLY SALINE) 0.9 % AERS Place 2 each into the nose as directed. Use nightly for sinus hygiene long-term.  Can also be used as many times daily as desired to assist with clearing congested sinuses. 127 mL 11   semaglutide -weight management (WEGOVY ) 0.25 MG/0.5ML SOAJ SQ injection Inject 0.25 mg into the skin once a week. 2 mL 2   Current Facility-Administered Medications  Medication Dose Route Frequency Provider Last Rate Last Admin   acidophilus (RISAQUAD) capsule 1 capsule  1 capsule Oral Daily Jesus Bernardino MATSU, MD       BP 96/62 (BP Location: Left Arm, Patient Position: Sitting, Cuff Size: Large)   Pulse 89   Ht 5' 6 (1.676 m)   Wt 182 lb (82.6 kg)   SpO2 93%    BMI 29.38 kg/m   PHYSICAL EXAM:  BP 96/62 (BP Location: Left Arm, Patient Position: Sitting, Cuff Size: Large)   Pulse 89   Ht 5' 6 (1.676 m)   Wt 182 lb (82.6 kg)   SpO2 93%   BMI 29.38 kg/m    Salient findings:  CN II-XII intact Bilateral EAC clear and TM intact with well pneumatized middle ear spaces Nose: Anterior rhinoscopy reveals mild deviation right septum, bilateral inferior turbinate modest hypertrophy.  Nasal endoscopy was indicated to better evaluate the nose and paranasal sinuses, given the patient's history and exam findings, and is detailed below. No external nasal lesions suggestive of prior rhinoplasty; able to palpate septal cartilage as well and does not appear she has had a septoplasty No lesions of oral cavity/oropharynx No respiratory distress or stridor   PROCEDURE:  Prior to initiating any procedures, risks/benefits/alternatives were explained to the patient and verbal consent obtained. Diagnostic Nasal Endoscopy Pre-procedure diagnosis: Concern for sinusitis; nasal congestion, rhinorrhea Post-procedure diagnosis: same Indication: See pre-procedure diagnosis and physical exam above Complications: None apparent EBL: 0 mL Anesthesia: Lidocaine  4% and topical decongestant was topically sprayed in each nasal cavity  Description of Procedure:  Patient was identified. A rigid 30 degree endoscope was utilized to evaluate the sinonasal cavities, mucosa, sinus ostia and turbinates and septum.  Overall, signs of mucosal inflammation are noted. No polyps, or masses noted.   Right Middle meatus: clear Right SE Recess: clear Left MM: thick purulence from max Left SE Recess: clear  Photodocumentation was obtained.  CPT CODE -- 31231 - Mod 25   ASSESSMENT:  68 y.o. with:  1. Other acute recurrent sinusitis   2. Nasal congestion   3. Rhinorrhea   4. Hypertrophy of both inferior nasal turbinates   5. Nasal obstruction   6. Nasal septal deviation   7. Other  allergic rhinitis    Although her symptoms sound more allergic currently, she clearly  sounds as if she is congested wih endoscopy showing purulence from left MM. Tried some medical management without significant improvement and given purulence, wonder if this is CRS or recurrent sinusitis  PLAN: We've discussed issues and options today.  We reviewed the nasal endoscopy images together.  The risks, benefits and alternatives were discussed and questions answered.  She has elected to proceed with:  1) Doxycycline  and pred taper 2) Flonase  and astelin  BID - can put in rinse 3) CT Sinus - post-treatment F/u in 4-6 weeks, sooner as necessary  See below regarding exact medications prescribed this encounter including dosages and route: Meds ordered this encounter  Medications   predniSONE  (DELTASONE ) 10 MG tablet    Sig: Take 3 tablets (30 mg total) by mouth daily with breakfast for 4 days, THEN 2 tablets (20 mg total) daily with breakfast for 4 days, THEN 1 tablet (10 mg total) daily with breakfast for 4 days.    Dispense:  24 tablet    Refill:  0   doxycycline  (VIBRA -TABS) 100 MG tablet    Sig: Take 1 tablet (100 mg total) by mouth 2 (two) times daily for 7 days.    Dispense:  14 tablet    Refill:  0   azelastine  (ASTELIN ) 0.1 % nasal spray    Sig: Place 2 sprays into both nostrils at bedtime. Use in each nostril as directed    Dispense:  30 mL    Refill:  12     Thank you for allowing me the opportunity to care for your patient. Please do not hesitate to contact me should you have any other questions.  Sincerely, Eldora Blanch, MD Otolaryngologist (ENT), [facility] Phone: [phone] Fax: [phone]  MDM:  Level 4: [id number] Complexity/Problems addressed: chronic problems Data complexity: high - independent interpretation of MRI; review of notes, labs, ordering test - Morbidity: mod  - Prescription Drug prescribed or managed: y  09/18/2024, 12:26 PM

## 2024-09-24 ENCOUNTER — Ambulatory Visit: Admitting: Internal Medicine

## 2024-09-24 ENCOUNTER — Other Ambulatory Visit: Payer: Self-pay

## 2024-09-24 ENCOUNTER — Telehealth: Payer: Self-pay

## 2024-09-24 ENCOUNTER — Encounter: Payer: Self-pay | Admitting: Internal Medicine

## 2024-09-24 VITALS — BP 120/64 | HR 69 | Temp 98.0°F | Ht 66.0 in | Wt 178.4 lb

## 2024-09-24 DIAGNOSIS — E876 Hypokalemia: Secondary | ICD-10-CM

## 2024-09-24 DIAGNOSIS — E669 Obesity, unspecified: Secondary | ICD-10-CM

## 2024-09-24 DIAGNOSIS — K760 Fatty (change of) liver, not elsewhere classified: Secondary | ICD-10-CM

## 2024-09-24 DIAGNOSIS — E039 Hypothyroidism, unspecified: Secondary | ICD-10-CM

## 2024-09-24 DIAGNOSIS — F419 Anxiety disorder, unspecified: Secondary | ICD-10-CM

## 2024-09-24 DIAGNOSIS — I700 Atherosclerosis of aorta: Secondary | ICD-10-CM

## 2024-09-24 DIAGNOSIS — G2581 Restless legs syndrome: Secondary | ICD-10-CM

## 2024-09-24 DIAGNOSIS — R110 Nausea: Secondary | ICD-10-CM

## 2024-09-24 DIAGNOSIS — R252 Cramp and spasm: Secondary | ICD-10-CM | POA: Diagnosis not present

## 2024-09-24 DIAGNOSIS — E559 Vitamin D deficiency, unspecified: Secondary | ICD-10-CM | POA: Diagnosis not present

## 2024-09-24 DIAGNOSIS — R232 Flushing: Secondary | ICD-10-CM

## 2024-09-24 DIAGNOSIS — E785 Hyperlipidemia, unspecified: Secondary | ICD-10-CM

## 2024-09-24 LAB — COMPREHENSIVE METABOLIC PANEL WITH GFR
ALT: 17 U/L (ref 0–35)
AST: 17 U/L (ref 0–37)
Albumin: 4.7 g/dL (ref 3.5–5.2)
Alkaline Phosphatase: 41 U/L (ref 39–117)
BUN: 16 mg/dL (ref 6–23)
CO2: 33 meq/L — ABNORMAL HIGH (ref 19–32)
Calcium: 9.7 mg/dL (ref 8.4–10.5)
Chloride: 96 meq/L (ref 96–112)
Creatinine, Ser: 0.81 mg/dL (ref 0.40–1.20)
GFR: 73.55 mL/min (ref >60.00)
Glucose, Bld: 108 mg/dL — ABNORMAL HIGH (ref 70–99)
Potassium: 3.1 meq/L — ABNORMAL LOW (ref 3.5–5.1)
Sodium: 138 meq/L (ref 135–145)
Total Bilirubin: 0.5 mg/dL (ref 0.2–1.2)
Total Protein: 7.6 g/dL (ref 6.0–8.3)

## 2024-09-24 LAB — LIPID PANEL
Cholesterol: 169 mg/dL (ref 0–200)
HDL: 72.1 mg/dL (ref >39.00)
LDL Cholesterol: 60 mg/dL (ref 0–99)
NonHDL: 97.08
Total CHOL/HDL Ratio: 2
Triglycerides: 183 mg/dL — ABNORMAL HIGH (ref 0.0–149.0)
VLDL: 36.6 mg/dL (ref 0.0–40.0)

## 2024-09-24 LAB — MAGNESIUM: Magnesium: 1.9 mg/dL (ref 1.5–2.5)

## 2024-09-24 LAB — CK: Total CK: 129 U/L (ref 17–177)

## 2024-09-24 MED ORDER — ONDANSETRON 4 MG PO TBDP: 4.0000 mg | ORAL_TABLET | Freq: Three times a day (TID) | ORAL | 2 refills | Status: DC | PRN

## 2024-09-24 MED ORDER — ONDANSETRON 4 MG PO TBDP: 4.0000 mg | ORAL_TABLET | Freq: Three times a day (TID) | ORAL | 2 refills | Status: AC | PRN

## 2024-09-24 MED ORDER — WEGOVY 0.5 MG/0.5ML ~~LOC~~ SOAJ: 0.5000 mg | SUBCUTANEOUS | 2 refills | Status: DC

## 2024-09-24 NOTE — Patient Instructions (Signed)
 Martha Orr

## 2024-09-24 NOTE — Telephone Encounter (Signed)
 Copied from CRM #[id number]. Topic: Clinical - Prescription Issue >> Sep 24, 2024  1:00 PM Martha Orr wrote: Reason for CRM: pt called and stated that her prescriptions that was sent in today was sent to the wrong pharmacy and needs to be sent to [facility], [place] [address].  Sent to [facility] sent message to pt via my chart

## 2024-09-24 NOTE — Progress Notes (Unsigned)
 ==============================  [facility]: [phone]   -- Medical Office Visit --  Patient: Martha Orr      Age: 68 y.o.       Sex:  female  Date:   09/24/2024 Today's Healthcare Provider: Bernardino KANDICE Cone, MD  ==============================   Chief Complaint: Discuss medication  (Wegovy  medication discuss today )   Discussed the use of AI scribe software for clinical note transcription with the patient, who gave verbal consent to proceed.  History of Present Illness      {Due for CT angiogram aneurysm and nodules 07/2024 NEXT: Adjust thyroid  medication; monitor TSH/FT4; assess eosinophilia  MONITOR: -Labs: TSH repeat in 6-8 weeks (low at 0.105); TFTs if symptoms occur -Vitals: Track HR trend with lower thyroid  dose; weight changes -Gaps: Overdue Medicare Annual Wellness Visit since 02/08/2023; Shingrix  #2 due 07/25/2023  PLAN: -F/U: Early return appointment in July to reassess thyroid  function post-dose adjustment -Refs: None pending currently; consider Endo if TSH remains abnormal despite dose adjustment -Rx: Decrease levothyroxine  from 112mcg to 88mcg daily effective immediately  CONTEXT: 68yo with 5+ year history of hypothyroidism now showing suppressed TSH (0.105) but normal FT4 (1.34). No clinical hyperthyroidism symptoms reported. Prior TSH stable and normal (most recent 05/2023 = 0.965). Other findings include mild eosinophilia 6.4% without reported respiratory symptoms and stable HgA1c 5.9%. Weight gain trend noted with 5lb increase since March 2025.  Key additional long-term monitoring needs:      CT angiogram for thoracic aortic aneurysm due August 2025     Sleep study consideration given snoring, fatigue, obesity     Assess recent tirzepatide  efficacy for weight/prediabetes     Triglycerides 164 (elevated) despite icosapent  ethyl therapy     Follow urology evaluation results for chronic hematuria  Consider  checking thyroid  antibodies if suppressed TSH persists after dose adjustment to evaluate for Hashimoto's with hashitoxicosis or Graves' disease development. :1 Problem List as of 09/24/2024 Reviewed: 09/18/2024  9:06 AM by Dannielle Pierre A, CMA    Allergic rhinitis   Last Assessment & Plan 02/09/2022 Office Visit Written 02/09/2022 10:21 AM by Kennyth Worth HERO, MD  Stable.  We have tried Astelin  in the past without improvement.  She has been referred to ENT in the past but does not wish to have surgery at this point.  She will let me know if she needs another referral.      Anxiety   Last Assessment & Plan 02/09/2022 Office Visit Written 02/09/2022 10:22 AM by Kennyth Worth HERO, MD  On Lexapro  20 mg daily.  We will continue.      Atherosclerosis of aorta   Last Assessment & Plan 11/22/2023 Office Visit Written 11/22/2023  5:01 PM by Cone Bernardino KANDICE, MD  Hyperlipidemia with Atherosclerosis (I70.0) Elevated triglycerides (194) with known atherosclerosis requiring aggressive management. Currently suboptimally controlled on current regimen. Plan:  Increase omega-3 supplementation from current once daily dosing Order fasting lipid panel Consider additional cholesterol-lowering medication based on results Counsel on dietary modifications, specifically avoiding saturated fat      Bilateral primary osteoarthritis of knee   Cough, persistent   Dyslipidemia   Last Assessment & Plan 02/09/2022 Office Visit Written 02/09/2022 10:22 AM by Kennyth Worth HERO, MD  On Crestor  20 mg daily.  Check lipids.      Gastroesophageal reflux disease without esophagitis   Globus pharyngeus   H/O cold sores   Last Assessment & Plan 08/02/2019 Office Visit Written 08/02/2019  2:52 PM  by Kennyth Worth HERO, MD  No current outbreak.  Continue acyclovir as needed.      Hand pain, left   Last Assessment & Plan 08/19/2023 Office Visit Edited 08/19/2023  8:44 PM by Jesus Bernardino MATSU, MD  Post-operative hand pain and  weakness Following hand surgery in May, she exhibits pain and weakness, likely due to nerve injury with inflammation. She is on pain medication and attending physical therapy. We will order an EMG to identify potential nerve damage, continue physical therapy, start B12 supplementation to support nerve healing, and apply Voltaren  gel for inflammation.  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 Plan:  Order urinalysis and urine cytology Refer to urology for comprehensive evaluation Educate on importance of evaluation despite chronic nature and family history      Hemorrhoid   Hepatic steatosis   Hiatal hernia   History of colon polyps   Hyperglycemia   Last Assessment & Plan 02/09/2022 Office Visit Written 02/09/2022 10:21 AM by Kennyth Worth HERO, MD  Check A1c.      Hypertension   Last Assessment & Plan 02/09/2022 Office Visit Written 02/09/2022 10:21 AM by Kennyth Worth HERO, MD  At goal per JNC 8 on HCTZ 12.5 mg daily.  Check labs.      Hypothyroidism   Last Assessment & Plan 04/26/2024 Office Visit Written 04/27/2024  8:11 AM by Jesus Bernardino MATSU, MD  Will order lab testing to guide management.       Lumbar arthropathy   Major depressive disorder, single episode, in remission   Last Assessment & Plan 05/30/2023 Office Visit Edited 05/30/2023  4:02 PM by Jesus Bernardino MATSU, MD  Low energy encouraged patient to take vitamin  D,       Nausea   Last Assessment & Plan 10/08/2022 Office Visit Written 10/08/2022 12:23 PM by Jesus Bernardino MATSU, MD  Continue to use home nausea medications and stay hydrated advised going to the hospital for IV fluids if she starts having a lot of vomiting      Obesity (BMI 30-39.9)   Last Assessment & Plan 08/23/2024 Office Visit Written 08/23/2024  8:51 PM by Jesus Bernardino MATSU, MD  Obesity with weight management therapy   Obesity with interest in weight management therapy. Previous weight loss success with medication, but insurance coverage issues halted treatment. Discussed Zepbound  as a potential option for weight loss and its benefits for sleep apnea and memory. Zepbound  is preferred over Wegovy  due to better outcomes for sleep apnea and memory concerns. Prescribe Zepbound  for weight management, pending insurance approval. Schedule follow-up to assess weight management progress and adjust treatment as needed.      Occult blood in stools   Last Assessment & Plan 10/29/2022 Office Visit Written 10/29/2022  8:17 PM by Jesus Bernardino MATSU, MD  Hold aspirin  Look at stools for blood Signs & symptoms of anemia printed and reviewed with her       Osteoarthritis   Last Assessment & Plan 08/02/2019 Office Visit Written 08/02/2019  2:53 PM by Kennyth Worth HERO, MD  Continue management per orthopedics.  Recommended that she can use Pennsaid  as needed at various sites of her body including her feet, toes, and neck.      Otalgia of both ears   Last Assessment & Plan 02/09/2022 Office Visit Written 02/09/2022 10:21 AM by Kennyth Worth HERO, MD  Has seborrheic dermatitis.  We will start acetic acid -hydrocortisone  drops.      Pulmonary nodules   Last Assessment & Plan 11/22/2023 Office Visit Written 11/22/2023  5:01 PM by Jesus Bernardino MATSU, MD  Stable Pulmonary Nodules and Thoracic Aortic Aneurysm (R91.1, I71.2) Recent CT angiogram from August 2024 shows stability of both pulmonary nodules and aortic  aneurysm. Plan:  Schedule repeat CT angiogram for August 2025 Continue current surveillance protocol  Medication Management Plan:  Coordinate with [facility] in [place] for automatic Zetban refills Maintain current 5mg  dosing as per patient preference Review and optimize current medication regimen      Restless legs   Last Assessment & Plan 04/26/2024 Office Visit Written 04/27/2024  8:11 AM by Jesus Bernardino MATSU, MD  Restless legs syndrome may be related to iron deficiency. Order blood work to assess iron levels and discuss the potential link between restless legs and iron deficiency.      Snoring   Thoracic aortic aneurysm   Last Assessment & Plan 08/19/2023 Office Visit Edited 08/19/2023  9:50 PM by Jesus Bernardino MATSU, MD  A mild aneurysmal dilatation of the thoracic aorta was noted on a CT scan from March 2023, with no current symptoms. We will order a repeat CT scan to monitor for progression and refer her to cardiology for further evaluation, including a potential echocardiogram and stress test.     :1} {   Updated Problem List Entries: No problems updated.   (optional):1 } Background Reviewed: Problem List: has Bilateral primary osteoarthritis of knee; Major depressive disorder, single episode, in remission; Anxiety; Dyslipidemia; Hypothyroidism; H/O cold sores; Osteoarthritis; Restless legs; Pulmonary nodules; Otalgia of both ears; Hypertension; Hyperglycemia; Hematuria; Thoracic aortic aneurysm; Allergic rhinitis; Nausea; Hepatic steatosis; Cough, persistent; Gastroesophageal reflux disease without esophagitis; Globus pharyngeus; Lumbar arthropathy; Occult blood in stools; Hiatal hernia; History of colon polyps; Hemorrhoid; Obesity (BMI 30-39.9); Snoring; Hand pain, left; and Atherosclerosis of aorta on their problem list. Past Medical History:  has a past medical history of Abdominal pain, chronic, right lower quadrant (09/27/2022), Anxiety, Arthritis, Chronic back pain,  Depression, Family history of adverse reaction to anesthesia, Gallstones, Hepatic steatosis (10/04/2022), High cholesterol, History of appendectomy (10/04/2022), History of cholecystectomy (10/04/2022), History of colon polyps, History of hysterectomy (10/04/2022), History of shingles, Hypertension, Hypothyroidism, Joint pain, Joint swelling, Microscopic hematuria, and Nausea (09/27/2022). Past Surgical History:   has a past surgical history that includes Appendectomy; Carpal tunnel release (Bilateral); Tonsillectomy; Bunionectomy (Bilateral); Knee arthroscopy (Right); Cholecystectomy; Nasal sinus surgery; Colonoscopy; knot removed from right breast; Total knee arthroplasty (Bilateral, 10/12/2016); Knee arthroscopy (Left, 11/01/2017); Breast biopsy; Tonsillectomy and adenoidectomy (2002); Abdominal hysterectomy (1998); and laparoscopy (N/A, 04/15/2023). Social History:   reports that she has never smoked. She has never used smokeless tobacco. She reports current alcohol use. She reports that she does not use drugs. Family History:  family history includes Arthritis in her father, sister, sister, and sister; Cancer in her brother and brother; Depression in her brother; Heart attack in her brother, brother, mother, and sister; Heart disease in her brother; Hyperlipidemia in her brother, mother, and sister; Hypertension in her brother; Sleep apnea in her  brother, sister, and sister; Stroke in her sister. Allergies:  has no known allergies.   Medication Reconciliation: Current Outpatient Medications on File Prior to Visit  Medication Sig   amoxicillin -clavulanate (AUGMENTIN ) 875-125 MG tablet Take 1 tablet by mouth 2 (two) times daily.   azelastine  (ASTELIN ) 0.1 % nasal spray Place 2 sprays into both nostrils at bedtime. Use in each nostril as directed   Biotin w/ Vitamins C & E (HAIR/SKIN/NAILS PO) Take 1 capsule by mouth daily.   cholecalciferol (VITAMIN D ) 1000 units tablet Take 1,000 Units by mouth at  bedtime.   escitalopram  (LEXAPRO ) 20 MG tablet TAKE 1 TABLET DAILY   fluticasone  (FLONASE ) 50 MCG/ACT nasal spray Place 2 sprays into both nostrils daily as needed for allergies.   fluticasone  (FLONASE ) 50 MCG/ACT nasal spray Place 2 sprays into both nostrils daily.   acetaminophen  (TYLENOL ) 325 MG tablet Take 325 mg by mouth every 6 (six) hours as needed for moderate pain.   doxycycline  (VIBRA -TABS) 100 MG tablet Take 1 tablet (100 mg total) by mouth 2 (two) times daily for 7 days.   Ginkgo Biloba Extract 60 MG CAPS Take 1 capsule by mouth daily.   hydrochlorothiazide  (MICROZIDE ) 12.5 MG capsule Take 1 capsule (12.5 mg total) by mouth daily.   icosapent  Ethyl (VASCEPA ) 1 g capsule Take 2 capsules (2 g total) by mouth 2 (two) times daily.   levothyroxine  (SYNTHROID ) 88 MCG tablet Take 1 tablet (88 mcg total) by mouth daily.   loratadine  (CLARITIN ) 10 MG tablet Take 1 tablet (10 mg total) by mouth daily.   predniSONE  (DELTASONE ) 10 MG tablet Take 3 tablets (30 mg total) by mouth daily with breakfast for 4 days, THEN 2 tablets (20 mg total) daily with breakfast for 4 days, THEN 1 tablet (10 mg total) daily with breakfast for 4 days.   pseudoephedrine  (SUDAFED 12 HOUR) 120 MG 12 hr tablet Take 1 tablet (120 mg total) by mouth 2 (two) times daily.   rosuvastatin  (CRESTOR ) 40 MG tablet Take 1 tablet (40 mg total) by mouth at bedtime.   Saline (SIMPLY SALINE) 0.9 % AERS Place 2 each into the nose as directed. Use nightly for sinus hygiene long-term.  Can also be used as many times daily as desired to assist with clearing congested sinuses.   Current Facility-Administered Medications on File Prior to Visit  Medication   acidophilus (RISAQUAD) capsule 1 capsule   Medications Discontinued During This Encounter  Medication Reason   semaglutide -weight management (WEGOVY ) 0.25 MG/0.5ML SOAJ SQ injection     Lab Results  Component Value Date   K 3.7 04/26/2024   K 3.3 (L) 11/22/2023   K 3.2 (L)  05/30/2023   K 3.1 (L) 04/14/2023   K 3.5 09/27/2022   K 3.9 02/09/2022   K 3.7 04/14/2021   K 4.3 02/06/2021   K 3.3 (L) 06/05/2020   K 3.9 11/13/2019   K 3.7 10/26/2017   K 3.8 10/13/2016   K 3.9 10/04/2016       Physical Exam:    09/24/2024    9:57 AM 09/18/2024    9:06 AM 08/23/2024   10:40 AM  Vitals with BMI  Height 5' 6 5' 6 5' 6  Weight 178 lbs 6 oz 182 lbs 182 lbs 6 oz  BMI 28.81 29.39 29.45  Systolic 120 96 110  Diastolic 64 62 72  Pulse 69 89 77  Vital signs reviewed.  Nursing notes reviewed. Weight trend reviewed. Physical Activity: Unknown (02/13/2024)  Exercise Vital Sign    Days of Exercise per Week: 0 days    Minutes of Exercise per Session: Not on file   General Appearance:  No acute distress appreciable.   Well-groomed, healthy-appearing female.  Well proportioned with no abnormal fat distribution.  Good muscle tone. Pulmonary:  Normal work of breathing at rest, no respiratory distress apparent. SpO2: 98 %  Musculoskeletal: All extremities are intact.  Neurological:  Awake, alert, oriented, and engaged.  No obvious focal neurological deficits or cognitive impairments.  Sensorium seems unclouded.   Speech is clear and coherent with logical content. Psychiatric:  Appropriate mood, pleasant and cooperative demeanor, thoughtful and engaged during the exam   Verbalized to patient: Physical Exam    Results:   Verbalized to patient: Results      08/23/2024   10:44 AM 04/26/2024    8:08 AM 11/22/2023    8:27 AM 08/19/2023    8:23 AM  PHQ 2/9 Scores  PHQ - 2 Score 0 0 0 0  PHQ- 9 Score 0 0 0     { {Insert previous labs (optional):23779} {See past labs  Heme  Chem  Endocrine  Serology  Results Review (optional):1} No results found for any visits on 09/24/24.} Office Visit on 04/26/2024  Component Date Value Ref Range Status   Cholesterol 04/26/2024 161  0 - 200 mg/dL Final   Triglycerides 94/98/7974 164.0 (H)  0.0 - 149.0 mg/dL Final    HDL 94/98/7974 62.70  >39.00 mg/dL Final   VLDL 94/98/7974 32.8  0.0 - 40.0 mg/dL Final   LDL Cholesterol 04/26/2024 66  0 - 99 mg/dL Final   Total CHOL/HDL Ratio 04/26/2024 3   Final   NonHDL 04/26/2024 98.69   Final   Sodium 04/26/2024 142  135 - 145 mEq/L Final   Potassium 04/26/2024 3.7  3.5 - 5.1 mEq/L Final   Chloride 04/26/2024 105  96 - 112 mEq/L Final   CO2 04/26/2024 29  19 - 32 mEq/L Final   Glucose, Bld 04/26/2024 97  70 - 99 mg/dL Final   BUN 94/98/7974 17  6 - 23 mg/dL Final   Creatinine, Ser 04/26/2024 0.72  0.40 - 1.20 mg/dL Final   Total Bilirubin 04/26/2024 0.5  0.2 - 1.2 mg/dL Final   Alkaline Phosphatase 04/26/2024 37 (L)  39 - 117 U/L Final   AST 04/26/2024 17  0 - 37 U/L Final   ALT 04/26/2024 16  0 - 35 U/L Final   Total Protein 04/26/2024 6.9  6.0 - 8.3 g/dL Final   Albumin 94/98/7974 4.2  3.5 - 5.2 g/dL Final   GFR 94/98/7974 84.96  >60.00 mL/min Final   Calcium  04/26/2024 9.4  8.4 - 10.5 mg/dL Final   WBC 94/98/7974 5.7  4.0 - 10.5 K/uL Final   RBC 04/26/2024 4.12  3.87 - 5.11 Mil/uL Final   Hemoglobin 04/26/2024 12.8  12.0 - 15.0 g/dL Final   HCT 94/98/7974 37.2  36.0 - 46.0 % Final   MCV 04/26/2024 90.2  78.0 - 100.0 fl Final   MCHC 04/26/2024 34.5  30.0 - 36.0 g/dL Final   RDW 94/98/7974 13.0  11.5 - 15.5 % Final   Platelets 04/26/2024 219.0  150.0 - 400.0 K/uL Final   Neutrophils Relative % 04/26/2024 45.4  43.0 - 77.0 % Final   Lymphocytes Relative 04/26/2024 37.4  12.0 - 46.0 % Final   Monocytes Relative 04/26/2024 10.2  3.0 - 12.0 % Final   Eosinophils Relative 04/26/2024 6.4 (H)  0.0 - 5.0 % Final   Basophils Relative 04/26/2024 0.6  0.0 - 3.0 % Final   Neutro Abs 04/26/2024 2.6  1.4 - 7.7 K/uL Final   Lymphs Abs 04/26/2024 2.1  0.7 - 4.0 K/uL Final   Monocytes Absolute 04/26/2024 0.6  0.1 - 1.0 K/uL Final   Eosinophils Absolute 04/26/2024 0.4  0.0 - 0.7 K/uL Final   Basophils Absolute 04/26/2024 0.0  0.0 - 0.1 K/uL Final   TSH 04/26/2024 0.105  (L)  0.450 - 4.500 uIU/mL Final   Hgb A1c MFr Bld 04/26/2024 5.9  4.6 - 6.5 % Final   Ferritin 04/26/2024 85.2  10.0 - 291.0 ng/mL Final   T4,Free (Direct) 04/26/2024 1.34  0.82 - 1.77 ng/dL Final  Office Visit on 11/22/2023  Component Date Value Ref Range Status   Rheumatoid fact SerPl-aCnc 11/22/2023 11  <14 IU/mL Final   Cyclic Citrullin Peptide Ab 88/73/7975 <16  UNITS Final   Cholesterol 11/22/2023 133  0 - 200 mg/dL Final   Triglycerides 88/73/7975 174.0 (H)  0.0 - 149.0 mg/dL Final   HDL 88/73/7975 52.90  >39.00 mg/dL Final   VLDL 88/73/7975 34.8  0.0 - 40.0 mg/dL Final   LDL Cholesterol 11/22/2023 45  0 - 99 mg/dL Final   Total CHOL/HDL Ratio 11/22/2023 3   Final   NonHDL 11/22/2023 80.12   Final   Sodium 11/22/2023 140  135 - 145 mEq/L Final   Potassium 11/22/2023 3.3 (L)  3.5 - 5.1 mEq/L Final   Chloride 11/22/2023 100  96 - 112 mEq/L Final   CO2 11/22/2023 32  19 - 32 mEq/L Final   Glucose, Bld 11/22/2023 90  70 - 99 mg/dL Final   BUN 88/73/7975 15  6 - 23 mg/dL Final   Creatinine, Ser 11/22/2023 0.75  0.40 - 1.20 mg/dL Final   Total Bilirubin 11/22/2023 0.5  0.2 - 1.2 mg/dL Final   Alkaline Phosphatase 11/22/2023 34 (L)  39 - 117 U/L Final   AST 11/22/2023 20  0 - 37 U/L Final   ALT 11/22/2023 20  0 - 35 U/L Final   Total Protein 11/22/2023 7.0  6.0 - 8.3 g/dL Final   Albumin 88/73/7975 4.3  3.5 - 5.2 g/dL Final   GFR 88/73/7975 81.14  >60.00 mL/min Final   Calcium  11/22/2023 9.4  8.4 - 10.5 mg/dL Final   WBC 88/73/7975 6.0  4.0 - 10.5 K/uL Final   RBC 11/22/2023 4.33  3.87 - 5.11 Mil/uL Final   Hemoglobin 11/22/2023 13.2  12.0 - 15.0 g/dL Final   HCT 88/73/7975 38.3  36.0 - 46.0 % Final   MCV 11/22/2023 88.4  78.0 - 100.0 fl Final   MCHC 11/22/2023 34.4  30.0 - 36.0 g/dL Final   RDW 88/73/7975 13.5  11.5 - 15.5 % Final   Platelets 11/22/2023 268.0  150.0 - 400.0 K/uL Final   Neutrophils Relative % 11/22/2023 53.7  43.0 - 77.0 % Final   Lymphocytes Relative  11/22/2023 31.5  12.0 - 46.0 % Final   Monocytes Relative 11/22/2023 9.5  3.0 - 12.0 % Final   Eosinophils Relative 11/22/2023 4.7  0.0 - 5.0 % Final   Basophils Relative 11/22/2023 0.6  0.0 - 3.0 % Final   Neutro Abs 11/22/2023 3.2  1.4 - 7.7 K/uL Final   Lymphs Abs 11/22/2023 1.9  0.7 - 4.0 K/uL Final   Monocytes Absolute 11/22/2023 0.6  0.1 - 1.0 K/uL Final   Eosinophils Absolute 11/22/2023 0.3  0.0 - 0.7  K/uL Final   Basophils Absolute 11/22/2023 0.0  0.0 - 0.1 K/uL Final   Color, Urine 11/22/2023 YELLOW  Yellow;Lt. Yellow;Straw;Dark Yellow;Amber;Green;Red;Brown Final   APPearance 11/22/2023 Sl Cloudy (A)  Clear;Turbid;Slightly Cloudy;Cloudy Final   Specific Gravity, Urine 11/22/2023 >=1.030 (A)  1.000 - 1.030 Final   pH 11/22/2023 6.0  5.0 - 8.0 Final   Total Protein, Urine 11/22/2023 NEGATIVE  Negative Final   Urine Glucose 11/22/2023 NEGATIVE  Negative Final   Ketones, ur 11/22/2023 NEGATIVE  Negative Final   Bilirubin Urine 11/22/2023 NEGATIVE  Negative Final   Hgb urine dipstick 11/22/2023 SMALL (A)  Negative Final   Urobilinogen, UA 11/22/2023 0.2  0.0 - 1.0 Final   Leukocytes,Ua 11/22/2023 TRACE (A)  Negative Final   Nitrite 11/22/2023 NEGATIVE  Negative Final   WBC, UA 11/22/2023 11-20/hpf (A)  0-2/hpf Final   RBC / HPF 11/22/2023 3-6/hpf (A)  0-2/hpf Final   Mucus, UA 11/22/2023 Presence of (A)  None Final   Squamous Epithelial / HPF 11/22/2023 Rare(0-4/hpf)  Rare(0-4/hpf) Final   Bacteria, UA 11/22/2023 Rare(<10/hpf) (A)  None Final   Amorphous 11/22/2023 Present (A)  None;Present Final  Office Visit on 09/20/2023  Component Date Value Ref Range Status   Cholesterol, Total 09/20/2023 147  100 - 199 mg/dL Final   Triglycerides 90/75/7975 194 (H)  0 - 149 mg/dL Final   HDL 90/75/7975 61  >39 mg/dL Final   VLDL Cholesterol Cal 09/20/2023 31  5 - 40 mg/dL Final   LDL Chol Calc (NIH) 09/20/2023 55  0 - 99 mg/dL Final   Chol/HDL Ratio 09/20/2023 2.4  0.0 - 4.4 ratio Final    Lipoprotein (a) 09/20/2023 <8.4  <75.0 nmol/L Final  Office Visit on 05/30/2023  Component Date Value Ref Range Status   Sodium 05/30/2023 141  135 - 145 mEq/L Final   Potassium 05/30/2023 3.2 (L)  3.5 - 5.1 mEq/L Final   Chloride 05/30/2023 98  96 - 112 mEq/L Final   CO2 05/30/2023 34 (H)  19 - 32 mEq/L Final   Glucose, Bld 05/30/2023 119 (H)  70 - 99 mg/dL Final   BUN 93/96/7975 13  6 - 23 mg/dL Final   Creatinine, Ser 05/30/2023 0.80  0.40 - 1.20 mg/dL Final   Total Bilirubin 05/30/2023 0.3  0.2 - 1.2 mg/dL Final   Alkaline Phosphatase 05/30/2023 44  39 - 117 U/L Final   AST 05/30/2023 22  0 - 37 U/L Final   ALT 05/30/2023 24  0 - 35 U/L Final   Total Protein 05/30/2023 7.1  6.0 - 8.3 g/dL Final   Albumin 93/96/7975 4.3  3.5 - 5.2 g/dL Final   GFR 93/96/7975 75.35  >60.00 mL/min Final   Calcium  05/30/2023 9.7  8.4 - 10.5 mg/dL Final   WBC 93/96/7975 5.9  4.0 - 10.5 K/uL Final   RBC 05/30/2023 4.26  3.87 - 5.11 Mil/uL Final   Hemoglobin 05/30/2023 12.9  12.0 - 15.0 g/dL Final   HCT 93/96/7975 38.1  36.0 - 46.0 % Final   MCV 05/30/2023 89.3  78.0 - 100.0 fl Final   MCHC 05/30/2023 34.0  30.0 - 36.0 g/dL Final   RDW 93/96/7975 12.9  11.5 - 15.5 % Final   Platelets 05/30/2023 245.0  150.0 - 400.0 K/uL Final   Neutrophils Relative % 05/30/2023 41.7 (L)  43.0 - 77.0 % Final   Lymphocytes Relative 05/30/2023 40.8  12.0 - 46.0 % Final   Monocytes Relative 05/30/2023 11.6  3.0 - 12.0 %  Final   Eosinophils Relative 05/30/2023 5.2 (H)  0.0 - 5.0 % Final   Basophils Relative 05/30/2023 0.7  0.0 - 3.0 % Final   Neutro Abs 05/30/2023 2.5  1.4 - 7.7 K/uL Final   Lymphs Abs 05/30/2023 2.4  0.7 - 4.0 K/uL Final   Monocytes Absolute 05/30/2023 0.7  0.1 - 1.0 K/uL Final   Eosinophils Absolute 05/30/2023 0.3  0.0 - 0.7 K/uL Final   Basophils Absolute 05/30/2023 0.0  0.0 - 0.1 K/uL Final   TSH 05/30/2023 0.965  0.450 - 4.500 uIU/mL Final  Hospital Outpatient Visit on 04/14/2023  Component Date  Value Ref Range Status   Sodium 04/14/2023 135  135 - 145 mmol/L Final   Potassium 04/14/2023 3.1 (L)  3.5 - 5.1 mmol/L Final   Chloride 04/14/2023 100  98 - 111 mmol/L Final   CO2 04/14/2023 26  22 - 32 mmol/L Final   Glucose, Bld 04/14/2023 177 (H)  70 - 99 mg/dL Final   BUN 95/81/7975 14  8 - 23 mg/dL Final   Creatinine, Ser 04/14/2023 0.82  0.44 - 1.00 mg/dL Final   Calcium  04/14/2023 9.1  8.9 - 10.3 mg/dL Final   GFR, Estimated 04/14/2023 >60  >60 mL/min Final   Anion gap 04/14/2023 9  5 - 15 Final  Lab on 10/18/2022  Component Date Value Ref Range Status   Fecal Occult Bld 10/18/2022 Positive (A)  Negative Final  Lab on 10/18/2022  Component Date Value Ref Range Status   WBC 10/18/2022 5.7  4.0 - 10.5 K/uL Final   RBC 10/18/2022 4.51  3.87 - 5.11 Mil/uL Final   Hemoglobin 10/18/2022 13.7  12.0 - 15.0 g/dL Final   HCT 89/76/7976 40.3  36.0 - 46.0 % Final   MCV 10/18/2022 89.2  78.0 - 100.0 fl Final   MCHC 10/18/2022 33.9  30.0 - 36.0 g/dL Final   RDW 89/76/7976 13.1  11.5 - 15.5 % Final   Platelets 10/18/2022 266.0  150.0 - 400.0 K/uL Final   Neutrophils Relative % 10/18/2022 47.3  43.0 - 77.0 % Final   Lymphocytes Relative 10/18/2022 38.2  12.0 - 46.0 % Final   Monocytes Relative 10/18/2022 11.3  3.0 - 12.0 % Final   Eosinophils Relative 10/18/2022 2.8  0.0 - 5.0 % Final   Basophils Relative 10/18/2022 0.4  0.0 - 3.0 % Final   Neutro Abs 10/18/2022 2.7  1.4 - 7.7 K/uL Final   Lymphs Abs 10/18/2022 2.2  0.7 - 4.0 K/uL Final   Monocytes Absolute 10/18/2022 0.6  0.1 - 1.0 K/uL Final   Eosinophils Absolute 10/18/2022 0.2  0.0 - 0.7 K/uL Final   Basophils Absolute 10/18/2022 0.0  0.0 - 0.1 K/uL Final  Lab on 10/15/2022  Component Date Value Ref Range Status   Fecal Occult Bld 10/15/2022 Positive (A)  Negative Final  Office Visit on 10/08/2022  Component Date Value Ref Range Status   H pylori Ag, Stl 10/14/2022 Negative  Negative Final  Office Visit on 09/27/2022  Component  Date Value Ref Range Status   WBC 09/27/2022 7.2  4.0 - 10.5 K/uL Final   RBC 09/27/2022 4.21  3.87 - 5.11 Mil/uL Final   Platelets 09/27/2022 228.0  150.0 - 400.0 K/uL Final   Hemoglobin 09/27/2022 13.2  12.0 - 15.0 g/dL Final   HCT 89/97/7976 37.6  36.0 - 46.0 % Final   MCV 09/27/2022 89.3  78.0 - 100.0 fl Final   MCHC 09/27/2022 35.0  30.0 - 36.0 g/dL  Final   RDW 09/27/2022 13.1  11.5 - 15.5 % Final   Sodium 09/27/2022 140  135 - 145 mEq/L Final   Potassium 09/27/2022 3.5  3.5 - 5.1 mEq/L Final   Chloride 09/27/2022 102  96 - 112 mEq/L Final   CO2 09/27/2022 29  19 - 32 mEq/L Final   Glucose, Bld 09/27/2022 119 (H)  70 - 99 mg/dL Final   BUN 89/97/7976 13  6 - 23 mg/dL Final   Creatinine, Ser 09/27/2022 0.89  0.40 - 1.20 mg/dL Final   Total Bilirubin 09/27/2022 0.4  0.2 - 1.2 mg/dL Final   Alkaline Phosphatase 09/27/2022 41  39 - 117 U/L Final   AST 09/27/2022 23  0 - 37 U/L Final   ALT 09/27/2022 29  0 - 35 U/L Final   Total Protein 09/27/2022 7.0  6.0 - 8.3 g/dL Final   Albumin 89/97/7976 4.2  3.5 - 5.2 g/dL Final   GFR 89/97/7976 66.61  >60.00 mL/min Final   Calcium  09/27/2022 9.4  8.4 - 10.5 mg/dL Final   Amylase 89/97/7976 45  27 - 131 U/L Final   Lipase 09/27/2022 31.0  11.0 - 59.0 U/L Final   LACTIC ACID 09/27/2022 1.2  0.4 - 1.8 mmol/L Final   Color, Urine 09/27/2022 YELLOW  Yellow;Lt. Yellow;Straw;Dark Yellow;Amber;Green;Red;Brown Final   APPearance 09/27/2022 CLEAR  Clear;Turbid;Slightly Cloudy;Cloudy Final   Specific Gravity, Urine 09/27/2022 1.025  1.000 - 1.030 Final   pH 09/27/2022 5.5  5.0 - 8.0 Final   Total Protein, Urine 09/27/2022 NEGATIVE  Negative Final   Urine Glucose 09/27/2022 NEGATIVE  Negative Final   Ketones, ur 09/27/2022 TRACE (A)  Negative Final   Bilirubin Urine 09/27/2022 NEGATIVE  Negative Final   Hgb urine dipstick 09/27/2022 SMALL (A)  Negative Final   Urobilinogen, UA 09/27/2022 0.2  0.0 - 1.0 Final   Leukocytes,Ua 09/27/2022 NEGATIVE   Negative Final   Nitrite 09/27/2022 NEGATIVE  Negative Final   WBC, UA 09/27/2022 0-2/hpf  0-2/hpf Final   RBC / HPF 09/27/2022 3-6/hpf (A)  0-2/hpf Final  There may be more visits with results that are not included.  No image results found. No results found.       ASSESSMENT & PLAN   Assessment & Plan Nausea  Hypokalemia  Vitamin D  deficiency  Acquired hypothyroidism  Dyslipidemia  Hepatic steatosis  Obesity (BMI 30-39.9)  Restless legs  Anxiety  Atherosclerosis of aorta  Cramps of left lower extremity  Hot flashes   {Assessment and Plan Assessment & Plan    }  ORDER ASSOCIATIONS  #   DIAGNOSIS / CONDITION ICD-10 ENCOUNTER ORDER  No diagnosis found.       Orders Placed in Encounter:   Lab Orders  No laboratory test(s) ordered today   Imaging Orders  No imaging studies ordered today   Referral Orders  No referral(s) requested today   No orders of the defined types were placed in this encounter.   No orders of the defined types were placed in this encounter. ED Discharge Orders     None         This document was synthesized by artificial intelligence (Abridge) using HIPAA-compliant recording of the clinical interaction;   We discussed the use of AI scribe software for clinical note transcription with the patient, who gave verbal consent to proceed. additional Info: This encounter employed state-of-the-art, real-time, collaborative documentation. The patient actively reviewed and assisted in updating their electronic medical record on a shared screen, ensuring transparency  and facilitating joint problem-solving for the problem list, overview, and plan. This approach promotes accurate, informed care. The treatment plan was discussed and reviewed in detail, including medication safety, potential side effects, and all patient questions. We confirmed understanding and comfort with the plan. Follow-up instructions were established, including contacting  the office for any concerns, returning if symptoms worsen, persist, or new symptoms develop, and precautions for potential emergency department visits.

## 2024-09-25 ENCOUNTER — Encounter: Payer: Self-pay | Admitting: Internal Medicine

## 2024-09-25 ENCOUNTER — Ambulatory Visit: Payer: Self-pay | Admitting: Internal Medicine

## 2024-09-25 LAB — VITAMIN D 25 HYDROXY (VIT D DEFICIENCY, FRACTURES): VITD: 37.36 ng/mL (ref 30.00–100.00)

## 2024-09-25 LAB — POTASSIUM, URINE, RANDOM: Potassium Urine: 22 mmol/L (ref 12–129)

## 2024-09-25 LAB — CREATININE, URINE, RANDOM: Creatinine, Urine: 116 mg/dL (ref 20–275)

## 2024-09-25 NOTE — Assessment & Plan Note (Signed)
 Shared decision-making done; patient understood rationale and agreed to [facility]

## 2024-09-25 NOTE — Assessment & Plan Note (Signed)
Send zofran

## 2024-09-25 NOTE — Assessment & Plan Note (Signed)
 Obesity management involves Wegovy , currently at 0.25 mg, with plans to increase the dose every four weeks as tolerated. The set point concept and the necessity of lifelong treatment were discussed. Reducing sugar intake is crucial to maximize Wegovy  benefits and prevent muscle loss. Potential side effects, including nausea, were discussed, and Zofran  was provided for management. Stevia is encouraged as a sugar substitute, and resistance training is recommended for fat loss. Wegovy  reduces the risk of stroke, heart attack, fatty liver disease, kidney damage, and dementia. Increase Wegovy  dose to 0.5 mg after four weeks if tolerated. Prescribe Zofran  for nausea management. Advise reducing sugar intake and replacing it with Stevia. Encourage resistance training to aid in fat loss.

## 2024-09-26 NOTE — Telephone Encounter (Signed)
 read by Channing SHAUNNA Bohr at 7:09AM on 09/26/2024

## 2024-09-29 LAB — SPECIMEN STATUS REPORT

## 2024-09-29 LAB — PARATHYROID HORMONE, INTACT (NO CA)

## 2024-10-08 ENCOUNTER — Encounter: Payer: Self-pay | Admitting: Internal Medicine

## 2024-10-08 ENCOUNTER — Other Ambulatory Visit: Payer: Self-pay | Admitting: Internal Medicine

## 2024-10-08 ENCOUNTER — Other Ambulatory Visit

## 2024-10-08 ENCOUNTER — Telehealth: Payer: Self-pay

## 2024-10-08 ENCOUNTER — Telehealth (INDEPENDENT_AMBULATORY_CARE_PROVIDER_SITE_OTHER): Admitting: Internal Medicine

## 2024-10-08 DIAGNOSIS — R3129 Other microscopic hematuria: Secondary | ICD-10-CM | POA: Diagnosis not present

## 2024-10-08 DIAGNOSIS — R350 Frequency of micturition: Secondary | ICD-10-CM | POA: Diagnosis not present

## 2024-10-08 DIAGNOSIS — R10A2 Flank pain, left side: Secondary | ICD-10-CM | POA: Diagnosis not present

## 2024-10-08 DIAGNOSIS — N3000 Acute cystitis without hematuria: Secondary | ICD-10-CM | POA: Diagnosis not present

## 2024-10-08 LAB — POC URINALSYSI DIPSTICK (AUTOMATED)
Bilirubin, UA: NEGATIVE
Glucose, UA: NEGATIVE
Ketones, UA: NEGATIVE
Leukocytes, UA: NEGATIVE
Nitrite, UA: NEGATIVE
Protein, UA: POSITIVE — AB
Spec Grav, UA: 1.02 (ref 1.010–1.025)
Urobilinogen, UA: NEGATIVE U/dL — AB
pH, UA: 6 (ref 5.0–8.0)

## 2024-10-08 MED ORDER — SULFAMETHOXAZOLE-TRIMETHOPRIM 800-160 MG PO TABS: 1.0000 | ORAL_TABLET | Freq: Two times a day (BID) | ORAL | 0 refills | Status: AC

## 2024-10-08 MED ORDER — PHENAZOPYRIDINE HCL 95 MG PO TABS
95.0000 mg | ORAL_TABLET | Freq: Three times a day (TID) | ORAL | 0 refills | Status: DC | PRN
Start: 2024-10-08 — End: 2024-10-30

## 2024-10-08 NOTE — Progress Notes (Signed)
 ====================================  [facility]: [phone]   --  Virtual Video Medical Office Visit --  Patient: Martha Orr      Age: 68 y.o.       Sex:  female  Date:   10/08/2024 Today's Healthcare Provider: Bernardino KANDICE Cone, MD  ====================================    Chief Complaint/Reason For Visit: Urinary Frequency (Pt states has been going to the bathroom a lot has been going on all weekend.)   Chart reviewed: has Bilateral primary osteoarthritis of knee; Major depressive disorder, single episode, in remission; Anxiety; Dyslipidemia; Hypothyroidism; H/O cold sores; Osteoarthritis; Restless legs; Pulmonary nodules; Otalgia of both ears; Hypertension; Hyperglycemia; Hematuria; Thoracic aortic aneurysm; Allergic rhinitis; Nausea; Hepatic steatosis; Cough, persistent; Gastroesophageal reflux disease without esophagitis; Globus pharyngeus; Lumbar arthropathy; Occult blood in stools; Hiatal hernia; History of colon polyps; Hemorrhoid; Obesity (BMI 30-39.9); Snoring; Hand pain, left; and Atherosclerosis of aorta on their problem list. Chart reviewed:  has a past medical history of Abdominal pain, chronic, right lower quadrant (09/27/2022), Anxiety, Arthritis, Chronic back pain, Depression, Family history of adverse reaction to anesthesia, Gallstones, Hepatic steatosis (10/04/2022), High cholesterol, History of appendectomy (10/04/2022), History of cholecystectomy (10/04/2022), History of colon polyps, History of hysterectomy (10/04/2022), History of shingles, Hypertension, Hypothyroidism, Joint pain, Joint swelling, Microscopic hematuria, and Nausea (09/27/2022). Discussed the use of AI scribe software for clinical note transcription with the patient, who gave verbal consent to proceed.  History of Present Illness Patient complaining of urinary symptoms like she has to pee more not fully emptying, feels like prior urinary tract infection (UTI),  kind of in back a little but no fevers.  Doing coffee, tylenol , cranberry juices.   Feels its going down my legs some to my back and using bathroom a lot     Results for orders placed or performed in visit on 10/08/24  POCT Urinalysis Dipstick (Automated)   Collection Time: 10/08/24  2:05 PM  Result Value Ref Range   Color, UA yellow    Clarity, UA pale    Glucose, UA Negative Negative   Bilirubin, UA neg    Ketones, UA neg    Spec Grav, UA 1.020 1.010 - 1.025   Blood, UA 1+    pH, UA 6.0 5.0 - 8.0   Protein, UA Positive (A) Negative   Urobilinogen, UA negative (A) 0.2 or 1.0 E.U./dL   Nitrite, UA neg    Leukocytes, UA Negative Negative    Medications reviewed: Current Outpatient Medications on File Prior to Visit  Medication Sig   acetaminophen  (TYLENOL ) 325 MG tablet Take 325 mg by mouth every 6 (six) hours as needed for moderate pain.   amoxicillin -clavulanate (AUGMENTIN ) 875-125 MG tablet Take 1 tablet by mouth 2 (two) times daily.   azelastine  (ASTELIN ) 0.1 % nasal spray Place 2 sprays into both nostrils at bedtime. Use in each nostril as directed   Biotin w/ Vitamins C & E (HAIR/SKIN/NAILS PO) Take 1 capsule by mouth daily.   cholecalciferol (VITAMIN D ) 1000 units tablet Take 1,000 Units by mouth at bedtime.   escitalopram  (LEXAPRO ) 20 MG tablet TAKE 1 TABLET DAILY   fluticasone  (FLONASE ) 50 MCG/ACT nasal spray Place 2 sprays into both nostrils daily as needed for allergies.   fluticasone  (FLONASE ) 50 MCG/ACT nasal spray Place 2 sprays into both nostrils daily.   Ginkgo Biloba Extract 60 MG CAPS Take 1 capsule by mouth daily.   hydrochlorothiazide  (MICROZIDE ) 12.5 MG capsule Take 1 capsule (12.5 mg total) by mouth  daily.   icosapent  Ethyl (VASCEPA ) 1 g capsule Take 2 capsules (2 g total) by mouth 2 (two) times daily.   levothyroxine  (SYNTHROID ) 88 MCG tablet Take 1 tablet (88 mcg total) by mouth daily.   loratadine  (CLARITIN ) 10 MG tablet Take 1 tablet (10 mg total) by  mouth daily.   ondansetron  (ZOFRAN -ODT) 4 MG disintegrating tablet Take 1 tablet (4 mg total) by mouth every 8 (eight) hours as needed for nausea or vomiting.   pseudoephedrine  (SUDAFED 12 HOUR) 120 MG 12 hr tablet Take 1 tablet (120 mg total) by mouth 2 (two) times daily.   rosuvastatin  (CRESTOR ) 40 MG tablet Take 1 tablet (40 mg total) by mouth at bedtime.   Saline (SIMPLY SALINE) 0.9 % AERS Place 2 each into the nose as directed. Use nightly for sinus hygiene long-term.  Can also be used as many times daily as desired to assist with clearing congested sinuses.   semaglutide -weight management (WEGOVY ) 0.5 MG/0.5ML SOAJ SQ injection Inject 0.5 mg into the skin once a week.   Current Facility-Administered Medications on File Prior to Visit  Medication   acidophilus (RISAQUAD) capsule 1 capsule  There are no discontinued medications.     Virtual Physical Exam:  General Appearance:  Well Developed, Well Nourished, No Acute Distress by Limited Video Assessment Pulmonary:  No Respiratory Distress Apparent. Normal Work of Breathing.   Neurological:  Awake, Alert. No Obvious Focal Neurological Deficits or Cognitive Impairments.  Sensorium Seems Unclouded. Psychiatric:  Appropriate Mood, Pleasant Demeanor, Calm, Articulate, Good Mood   Results for orders placed or performed in visit on 10/08/24  POCT Urinalysis Dipstick (Automated)  Result Value Ref Range   Color, UA yellow    Clarity, UA pale    Glucose, UA Negative Negative   Bilirubin, UA neg    Ketones, UA neg    Spec Grav, UA 1.020 1.010 - 1.025   Blood, UA 1+    pH, UA 6.0 5.0 - 8.0   Protein, UA Positive (A) Negative   Urobilinogen, UA negative (A) 0.2 or 1.0 E.U./dL   Nitrite, UA neg    Leukocytes, UA Negative Negative   Telemedicine on 10/08/2024  Component Date Value   Color, UA 10/08/2024 yellow    Clarity, UA 10/08/2024 pale    Glucose, UA 10/08/2024 Negative    Bilirubin, UA 10/08/2024 neg    Ketones, UA 10/08/2024  neg    Spec Grav, UA 10/08/2024 1.020    Blood, UA 10/08/2024 1+    pH, UA 10/08/2024 6.0    Protein, UA 10/08/2024 Positive (A)    Urobilinogen, UA 10/08/2024 negative (A)    Nitrite, UA 10/08/2024 neg    Leukocytes, UA 10/08/2024 Negative   Office Visit on 09/24/2024  Component Date Value   Sodium 09/24/2024 138    Potassium 09/24/2024 3.1 (L)    Chloride 09/24/2024 96    CO2 09/24/2024 33 (H)    Glucose, Bld 09/24/2024 108 (H)    BUN 09/24/2024 16    Creatinine, Ser 09/24/2024 0.81    Total Bilirubin 09/24/2024 0.5    Alkaline Phosphatase 09/24/2024 41    AST 09/24/2024 17    ALT 09/24/2024 17    Total Protein 09/24/2024 7.6    Albumin 09/24/2024 4.7    GFR 09/24/2024 73.55    Calcium  09/24/2024 9.7    Total CK 09/24/2024 129    Magnesium 09/24/2024 1.9    TSH W/REFLEX TO FT4 09/24/2024 0.87    Potassium Urine 09/24/2024 22  Creatinine, Urine 09/24/2024 116    Cholesterol 09/24/2024 169    Triglycerides 09/24/2024 183.0 (H)    HDL 09/24/2024 72.10    VLDL 09/24/2024 36.6    LDL Cholesterol 09/24/2024 60    Total CHOL/HDL Ratio 09/24/2024 2    NonHDL 09/24/2024 97.08    PTH 09/24/2024 31    VITD 09/24/2024 37.36    PTH 09/24/2024 CANCELED    specimen status report 09/24/2024 Comment   Office Visit on 04/26/2024  Component Date Value   Cholesterol 04/26/2024 161    Triglycerides 04/26/2024 164.0 (H)    HDL 04/26/2024 62.70    VLDL 04/26/2024 32.8    LDL Cholesterol 04/26/2024 66    Total CHOL/HDL Ratio 04/26/2024 3    NonHDL 04/26/2024 98.69    Sodium 04/26/2024 142    Potassium 04/26/2024 3.7    Chloride 04/26/2024 105    CO2 04/26/2024 29    Glucose, Bld 04/26/2024 97    BUN 04/26/2024 17    Creatinine, Ser 04/26/2024 0.72    Total Bilirubin 04/26/2024 0.5    Alkaline Phosphatase 04/26/2024 37 (L)    AST 04/26/2024 17    ALT 04/26/2024 16    Total Protein 04/26/2024 6.9    Albumin 04/26/2024 4.2    GFR 04/26/2024 84.96    Calcium  04/26/2024 9.4     WBC 04/26/2024 5.7    RBC 04/26/2024 4.12    Hemoglobin 04/26/2024 12.8    HCT 04/26/2024 37.2    MCV 04/26/2024 90.2    MCHC 04/26/2024 34.5    RDW 04/26/2024 13.0    Platelets 04/26/2024 219.0    Neutrophils Relative % 04/26/2024 45.4    Lymphocytes Relative 04/26/2024 37.4    Monocytes Relative 04/26/2024 10.2    Eosinophils Relative 04/26/2024 6.4 (H)    Basophils Relative 04/26/2024 0.6    Neutro Abs 04/26/2024 2.6    Lymphs Abs 04/26/2024 2.1    Monocytes Absolute 04/26/2024 0.6    Eosinophils Absolute 04/26/2024 0.4    Basophils Absolute 04/26/2024 0.0    TSH 04/26/2024 0.105 (L)    Hgb A1c MFr Bld 04/26/2024 5.9    Ferritin 04/26/2024 85.2    T4,Free (Direct) 04/26/2024 1.34   Office Visit on 11/22/2023  Component Date Value   Rheumatoid fact SerPl-aC* 11/22/2023 11    Cyclic Citrullin Peptide* 11/22/2023 <16    Cholesterol 11/22/2023 133    Triglycerides 11/22/2023 174.0 (H)    HDL 11/22/2023 52.90    VLDL 11/22/2023 34.8    LDL Cholesterol 11/22/2023 45    Total CHOL/HDL Ratio 11/22/2023 3    NonHDL 11/22/2023 80.12    Sodium 11/22/2023 140    Potassium 11/22/2023 3.3 (L)    Chloride 11/22/2023 100    CO2 11/22/2023 32    Glucose, Bld 11/22/2023 90    BUN 11/22/2023 15    Creatinine, Ser 11/22/2023 0.75    Total Bilirubin 11/22/2023 0.5    Alkaline Phosphatase 11/22/2023 34 (L)    AST 11/22/2023 20    ALT 11/22/2023 20    Total Protein 11/22/2023 7.0    Albumin 11/22/2023 4.3    GFR 11/22/2023 81.14    Calcium  11/22/2023 9.4    WBC 11/22/2023 6.0    RBC 11/22/2023 4.33    Hemoglobin 11/22/2023 13.2    HCT 11/22/2023 38.3    MCV 11/22/2023 88.4    MCHC 11/22/2023 34.4    RDW 11/22/2023 13.5    Platelets 11/22/2023 268.0    Neutrophils Relative % 11/22/2023  53.7    Lymphocytes Relative 11/22/2023 31.5    Monocytes Relative 11/22/2023 9.5    Eosinophils Relative 11/22/2023 4.7    Basophils Relative 11/22/2023 0.6    Neutro Abs 11/22/2023 3.2     Lymphs Abs 11/22/2023 1.9    Monocytes Absolute 11/22/2023 0.6    Eosinophils Absolute 11/22/2023 0.3    Basophils Absolute 11/22/2023 0.0    Color, Urine 11/22/2023 YELLOW    APPearance 11/22/2023 Sl Cloudy (A)    Specific Gravity, Urine 11/22/2023 >=1.030 (A)    pH 11/22/2023 6.0    Total Protein, Urine 11/22/2023 NEGATIVE    Urine Glucose 11/22/2023 NEGATIVE    Ketones, ur 11/22/2023 NEGATIVE    Bilirubin Urine 11/22/2023 NEGATIVE    Hgb urine dipstick 11/22/2023 SMALL (A)    Urobilinogen, UA 11/22/2023 0.2    Leukocytes,Ua 11/22/2023 TRACE (A)    Nitrite 11/22/2023 NEGATIVE    WBC, UA 11/22/2023 11-20/hpf (A)    RBC / HPF 11/22/2023 3-6/hpf (A)    Mucus, UA 11/22/2023 Presence of (A)    Squamous Epithelial / HPF 11/22/2023 Rare(0-4/hpf)    Bacteria, UA 11/22/2023 Rare(<10/hpf) (A)    Amorphous 11/22/2023 Present (A)   No image results found. No results found.CT ANGIO CHEST AORTA W/CM & OR WO/CM Result Date: 05/03/2024 CLINICAL DATA:  Chronic dyspnea. Follow-up ascending thoracic aortic aneurysm. EXAM: CT ANGIOGRAPHY CHEST WITH CONTRAST TECHNIQUE: Multidetector CT imaging of the chest was performed using the standard protocol during bolus administration of intravenous contrast. Multiplanar CT image reconstructions and MIPs were obtained to evaluate the vascular anatomy. RADIATION DOSE REDUCTION: This exam was performed according to the departmental dose-optimization program which includes automated exposure control, adjustment of the mA and/or kV according to patient size and/or use of iterative reconstruction technique. CONTRAST:  75mL ISOVUE -370 IOPAMIDOL  (ISOVUE -370) INJECTION 76% COMPARISON:  Multiple prior chest CTs. The most recent is 08/25/2023 FINDINGS: Cardiovascular: The heart is normal in size. No pericardial effusion. Stable fluid in the pericardial recesses. Stable fusiform aneurysmal dilatation of the ascending thoracic aorta measuring a maximum of 4.4 cm. No dissection.  Minimal scattered atherosclerotic calcifications. Scattered coronary artery calcifications. The pulmonary arteries are unremarkable. Mediastinum/Nodes: Stable scattered mediastinal and hilar lymph nodes but no mass or overt adenopathy. No new or progressive findings. The esophagus is unremarkable. Lungs/Pleura: Stable mild emphysematous changes and scattered areas of pulmonary scarring. No infiltrates, edema or effusions. No worrisome pulmonary lesions or pulmonary nodules. Stable calcified granuloma in the right upper lobe. Stable 3 mm nodule along the right minor fissure consistent with a benign lymph node. Stable 3 mm nodule in the right upper lobe on image number 42/6. The central tracheobronchial tree is unremarkable. No pleural effusion or pleural nodules. Upper Abdomen: No significant upper abdominal findings. No hepatic or adrenal gland lesions. Status post cholecystectomy. No biliary dilatation. Musculoskeletal: No breast masses, supraclavicular or axillary adenopathy. There is a 9 mm left axillary node on image number 21/4 which previously measured 12 mm and is likely a resolving reactive node. The bony thorax is intact. Review of the MIP images confirms the above findings. IMPRESSION: 1. Stable fusiform aneurysmal dilatation of the ascending thoracic aorta measuring a maximum of 4.4 cm. No dissection. Recommend annual imaging followup by CTA or MRA. This recommendation follows 2010 ACCF/AHA/AATS/ACR/ASA/SCA/SCAI/SIR/STS/SVM Guidelines for the Diagnosis and Management of Patients with Thoracic Aortic Disease. Circulation. 2010; 121: Z733-z630. Aortic aneurysm NOS (ICD10-I71.9) 2. Stable mild emphysematous changes and scattered areas of pulmonary scarring. 3. No worrisome pulmonary lesions or pulmonary  nodules. 4. Stable scattered mediastinal and hilar lymph nodes but no new or progressive findings. Emphysema (ICD10-J43.9). Electronically Signed   By: MYRTIS Stammer M.D.   On: 05/03/2024 14:33         ASSESSMENT & PLAN   Assessment & Plan Frequency of urination Microscopic hematuria Acute cystitis without hematuria Left flank pain Suspicious for kidney stone. Follow up 7 days. Antibiotic(s) given as could be cystitis with false negative urinalysis and symptom(s) concerning for potential early pyelo   ORDER ASSOCIATIONS  #   DIAGNOSIS / CONDITION ICD-10 ENCOUNTER ORDER     ICD-10-CM   1. Frequency of urination  R35.0 POCT Urinalysis Dipstick (Automated)    Urine Culture    Urine Culture    phenazopyridine (QC AZO) 95 MG tablet    sulfamethoxazole-trimethoprim (BACTRIM DS) 800-160 MG tablet    Ambulatory referral to Urology    2. Microscopic hematuria  R31.29 phenazopyridine (QC AZO) 95 MG tablet    sulfamethoxazole-trimethoprim (BACTRIM DS) 800-160 MG tablet    Ambulatory referral to Urology    3. Acute cystitis without hematuria  N30.00 phenazopyridine (QC AZO) 95 MG tablet    sulfamethoxazole-trimethoprim (BACTRIM DS) 800-160 MG tablet    Ambulatory referral to Urology    4. Left flank pain  R10.A2           Orders Placed in Encounter:  Lab Orders         Urine Culture         POCT Urinalysis Dipstick (Automated)     Orders Placed This Encounter  Procedures   Urine Culture    Standing Status:   Future    Number of Occurrences:   1    Expiration Date:   10/08/2025   Ambulatory referral to Urology    Referral Priority:   Routine    Referral Type:   Consultation    Referral Reason:   Specialty Services Required    Requested Specialty:   Urology    Number of Visits Requested:   1   POCT Urinalysis Dipstick (Automated)          Treatment plan discussed and reviewed in detail. Explained medication safety and potential side effects.  Answered all patient questions and confirmed understanding and comfort with the plan. Encouraged patient to contact our office if they have any questions or concerns.  Agreed on patient coming for a sooner office visit if symptoms  worsen, persist, or new symptoms develop. Discussed precautions in case of needing to visit the Emergency Department.    ----------------------------------------------------- Attestation:  Today's Healthcare Provider Bernardino KANDICE Cone, MD was located at office at [facility] [address], [place] [address].  The patient was located as passenger in moving car. All video encounter participant identities and locations confirmed visually and verbally.Today's Telemedicine visit was conducted via synchronous Video after consent for telemedicine was obtained:  Video connection was lost when less than 50% of the duration of the visit was complete, at which time the remainder of the visit was completed via audio only.     This document was transcribed and resynthesized, in part, by artificial intelligence (Abridge)  using HIPAA-compliant recording of the clinical interaction;   We have discussed the our use of AI scribe software for clinical note transcription with the patient, who has given verbal consent to proceed.

## 2024-10-08 NOTE — Telephone Encounter (Signed)
 Copied from CRM #[id number]. Topic: General - Other >> Oct 08, 2024  7:47 AM Martha Orr wrote: Reason for CRM: patient is requesting a call back from [facility] pertaining to a medication question.     CB#[phone] (M)  Spoke with pt she will make VV with Dr Jesus end of the day

## 2024-10-09 LAB — URINE CULTURE
MICRO NUMBER:: 17091007
SPECIMEN QUALITY:: ADEQUATE

## 2024-10-10 LAB — PARATHYROID HORMONE, INTACT (NO CA): PTH: 31 pg/mL (ref 16–77)

## 2024-10-10 LAB — ALDOSTERONE + RENIN ACTIVITY W/ RATIO
ALDO / PRA Ratio: 1.2 ratio (ref 0.9–28.9)
Aldosterone: 4 ng/dL
Renin Activity: 3.37 ng/mL/h (ref 0.25–5.82)

## 2024-10-10 LAB — TSH+FREE T4: TSH W/REFLEX TO FT4: 0.87 m[IU]/L (ref 0.40–4.50)

## 2024-10-11 ENCOUNTER — Ambulatory Visit: Payer: Self-pay | Admitting: Internal Medicine

## 2024-10-22 ENCOUNTER — Institutional Professional Consult (permissible substitution) (INDEPENDENT_AMBULATORY_CARE_PROVIDER_SITE_OTHER): Admitting: Otolaryngology

## 2024-10-22 ENCOUNTER — Ambulatory Visit (HOSPITAL_COMMUNITY)
Admission: RE | Admit: 2024-10-22 | Discharge: 2024-10-22 | Disposition: A | Discharge status: Home / Self Care | Source: Ambulatory Visit | Attending: Otolaryngology | Admitting: Otolaryngology

## 2024-10-22 DIAGNOSIS — J0181 Other acute recurrent sinusitis: Secondary | ICD-10-CM | POA: Diagnosis present

## 2024-10-22 DIAGNOSIS — J3489 Other specified disorders of nose and nasal sinuses: Secondary | ICD-10-CM | POA: Diagnosis present

## 2024-10-22 DIAGNOSIS — R0981 Nasal congestion: Secondary | ICD-10-CM | POA: Insufficient documentation

## 2024-10-22 DIAGNOSIS — J342 Deviated nasal septum: Secondary | ICD-10-CM | POA: Insufficient documentation

## 2024-10-22 DIAGNOSIS — J343 Hypertrophy of nasal turbinates: Secondary | ICD-10-CM | POA: Diagnosis present

## 2024-10-25 ENCOUNTER — Encounter (INDEPENDENT_AMBULATORY_CARE_PROVIDER_SITE_OTHER): Payer: Self-pay

## 2024-10-25 ENCOUNTER — Telehealth (INDEPENDENT_AMBULATORY_CARE_PROVIDER_SITE_OTHER): Payer: Self-pay | Admitting: Otolaryngology

## 2024-10-25 NOTE — Telephone Encounter (Signed)
 LVM for patient to call back to reschedule appointment - Dr Tobie in surgery

## 2024-10-30 ENCOUNTER — Ambulatory Visit: Admitting: Internal Medicine

## 2024-10-30 ENCOUNTER — Encounter: Payer: Self-pay | Admitting: Internal Medicine

## 2024-10-30 ENCOUNTER — Ambulatory Visit (INDEPENDENT_AMBULATORY_CARE_PROVIDER_SITE_OTHER): Admitting: Otolaryngology

## 2024-10-30 ENCOUNTER — Encounter (INDEPENDENT_AMBULATORY_CARE_PROVIDER_SITE_OTHER): Payer: Self-pay | Admitting: Otolaryngology

## 2024-10-30 VITALS — BP 86/57 | HR 95 | Ht 66.0 in | Wt 178.0 lb

## 2024-10-30 VITALS — BP 110/72 | HR 77 | Temp 98.0°F | Ht 66.0 in | Wt 178.2 lb

## 2024-10-30 DIAGNOSIS — E663 Overweight: Secondary | ICD-10-CM | POA: Diagnosis not present

## 2024-10-30 DIAGNOSIS — J309 Allergic rhinitis, unspecified: Secondary | ICD-10-CM | POA: Diagnosis not present

## 2024-10-30 DIAGNOSIS — J343 Hypertrophy of nasal turbinates: Secondary | ICD-10-CM

## 2024-10-30 DIAGNOSIS — E876 Hypokalemia: Secondary | ICD-10-CM | POA: Diagnosis not present

## 2024-10-30 DIAGNOSIS — J328 Other chronic sinusitis: Secondary | ICD-10-CM | POA: Diagnosis not present

## 2024-10-30 DIAGNOSIS — R0981 Nasal congestion: Secondary | ICD-10-CM | POA: Diagnosis not present

## 2024-10-30 DIAGNOSIS — I739 Peripheral vascular disease, unspecified: Secondary | ICD-10-CM | POA: Diagnosis not present

## 2024-10-30 DIAGNOSIS — E039 Hypothyroidism, unspecified: Secondary | ICD-10-CM

## 2024-10-30 DIAGNOSIS — R319 Hematuria, unspecified: Secondary | ICD-10-CM

## 2024-10-30 DIAGNOSIS — T50905A Adverse effect of unspecified drugs, medicaments and biological substances, initial encounter: Secondary | ICD-10-CM | POA: Diagnosis not present

## 2024-10-30 DIAGNOSIS — E079 Disorder of thyroid, unspecified: Secondary | ICD-10-CM

## 2024-10-30 DIAGNOSIS — J324 Chronic pansinusitis: Secondary | ICD-10-CM | POA: Diagnosis not present

## 2024-10-30 DIAGNOSIS — K760 Fatty (change of) liver, not elsewhere classified: Secondary | ICD-10-CM

## 2024-10-30 DIAGNOSIS — J3489 Other specified disorders of nose and nasal sinuses: Secondary | ICD-10-CM

## 2024-10-30 DIAGNOSIS — J300 Vasomotor rhinitis: Secondary | ICD-10-CM

## 2024-10-30 LAB — COMPREHENSIVE METABOLIC PANEL WITH GFR
ALT: 18 U/L (ref 0–35)
AST: 19 U/L (ref 0–37)
Albumin: 4.5 g/dL (ref 3.5–5.2)
Alkaline Phosphatase: 37 U/L — ABNORMAL LOW (ref 39–117)
BUN: 11 mg/dL (ref 6–23)
CO2: 32 meq/L (ref 19–32)
Calcium: 9.4 mg/dL (ref 8.4–10.5)
Chloride: 100 meq/L (ref 96–112)
Creatinine, Ser: 0.74 mg/dL (ref 0.40–1.20)
GFR: 81.92 mL/min (ref >60.00)
Glucose, Bld: 81 mg/dL (ref 70–99)
Potassium: 3.4 meq/L — ABNORMAL LOW (ref 3.5–5.1)
Sodium: 142 meq/L (ref 135–145)
Total Bilirubin: 0.5 mg/dL (ref 0.2–1.2)
Total Protein: 7.1 g/dL (ref 6.0–8.3)

## 2024-10-30 LAB — LIPID PANEL
Cholesterol: 138 mg/dL (ref 0–200)
HDL: 51.8 mg/dL (ref >39.00)
LDL Cholesterol: 51 mg/dL (ref 0–99)
NonHDL: 86.31
Total CHOL/HDL Ratio: 3
Triglycerides: 177 mg/dL — ABNORMAL HIGH (ref 0.0–149.0)
VLDL: 35.4 mg/dL (ref 0.0–40.0)

## 2024-10-30 LAB — MAGNESIUM: Magnesium: 1.9 mg/dL (ref 1.5–2.5)

## 2024-10-30 MED ORDER — WEGOVY 0.5 MG/0.5ML ~~LOC~~ SOAJ: 0.5000 mg | SUBCUTANEOUS | 5 refills | Status: DC

## 2024-10-30 MED ORDER — IPRATROPIUM BROMIDE 0.06 % NA SOLN: 2.0000 | Freq: Two times a day (BID) | NASAL | 12 refills | Status: AC | PRN

## 2024-10-30 MED ORDER — LEVOCETIRIZINE DIHYDROCHLORIDE 5 MG PO TABS
5.0000 mg | ORAL_TABLET | Freq: Every evening | ORAL | 2 refills | Status: AC
Start: 2024-10-30 — End: ?

## 2024-10-30 MED ORDER — AMOXICILLIN-POT CLAVULANATE 875-125 MG PO TABS: 1.0000 | ORAL_TABLET | Freq: Two times a day (BID) | ORAL | 0 refills | Status: AC

## 2024-10-30 NOTE — Patient Instructions (Addendum)
 It was a pleasure seeing you today! Your health and satisfaction are our top priorities.  Bernardino Cone, MD  VISIT SUMMARY: Today, you had a follow-up appointment to review your blood work and discuss your use of Wegovy . We also addressed your chronic sinus issues, thyroid  concerns, and potassium levels.  YOUR PLAN: -OBESITY AND WEIGHT MANAGEMENT WITH WEGOVY  THERAPY: You are currently on Wegovy  0.5 mg for weight management. Wegovy  helps reduce the risk of stroke, heart attack, fatty liver disease, and diabetes. We discussed the importance of slow and steady weight loss to avoid side effects like 'Wegovy  face'. Continue taking Wegovy  0.5 mg weekly and consider resistance training to help prevent 'Wegovy  face'. We have documented the medical necessity of Wegovy  to support your insurance appeal.  -CHRONIC PANSINUSITIS AND ALLERGIC RHINITIS: You have chronic sinus inflammation causing headaches, nasal congestion, and post-nasal drip. A recent CT scan confirmed sinus inflammation. We recommend daily sinus rinses and have prescribed Augmentin  for sinus infection and Xyzal  for allergies. You have a referral to an ENT specialist for further management, and we discussed the possibility of sinus surgery to improve drainage and reduce symptoms.  -FATTY LIVER DISEASE: You have fatty liver disease, which Wegovy  therapy may help eliminate. Continue with Wegovy  therapy as prescribed.  -THYROID  DISORDER WITH GOITER: You have a thyroid  disorder with an enlarged thyroid  (goiter). We discussed the potential need for a thyroid  ultrasound to assess the size and characteristics of the goiter. We have ordered a thyroid  ultrasound and rechecked your thyroid  function tests.  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 Your blood pressure is well-controlled, and weight loss may have contributed to this. We have decided to discontinue  hydrochlorothiazide  to reduce your pill burden and will monitor your potassium levels. Please monitor your blood pressure at home and report back in 3-6 months.  INSTRUCTIONS: Please continue taking Wegovy  0.5 mg weekly and consider resistance training. Use daily sinus rinses, take Augmentin  and Xyzal  as prescribed, and follow up with the ENT specialist. We have ordered a thyroid  ultrasound and rechecked your thyroid  function tests. Discontinue hydrochlorothiazide  and monitor your blood pressure at home, reporting back in 3-6 months.  Your Providers PCP: Cone Bernardino MATSU, MD,  [phone]) Referring Provider: Cone Bernardino MATSU, MD,  [phone]) Care Team Provider: Gordan Bergeron, MD,  [phone]) Care Team Provider: Irven Ozell DEL, MD,  [phone]) Care Team Provider: Eda Baxter, MD,  [phone]) Care Team Provider: Sheril Coy, MD,  [phone]) Care Team Provider: Murray Drivers, DDS,  [phone]) Care Team Provider: [place], [place], [place],  [phone]) Care Team Provider: Cesario Boer, MD,  [phone]) Care Team Provider: Beather Delon Gibson, [place],  [phone]) Care Team Provider: Kallie Manuelita BROCKS, MD,  [phone]) Care Team Provider: Buck Saucer, MD,  [phone]) Care Team Provider: Jeffrie Oneil BROCKS, MD,  [phone])  NEXT STEPS: [x]  Early Intervention: Schedule sooner appointment, call our on-call services, or go to emergency room if there is any significant Increase in pain or discomfort New or worsening symptoms Sudden or severe changes in your health [x]  Flexible Follow-Up: We recommend a No follow-ups on file. for optimal routine care. This allows for progress monitoring and treatment adjustments. [x]  Preventive Care: Schedule your annual preventive care visit! It's typically covered by insurance and helps identify potential health issues early. [x]  Lab & X-ray Appointments: Incomplete tests scheduled today, or call to schedule. X-rays:  [facility] (M-F, 8:30am-noon or 1pm-5pm). [x]  Medical Information Release:  Sign a release form at front desk to obtain relevant medical information we don't have.  MAKING THE MOST OF OUR FOCUSED 20 MINUTE APPOINTMENTS: [x]   Clearly state your top concerns at the beginning of the visit to focus our discussion [x]   If you anticipate you will need more time, please inform the front desk during scheduling - we can book multiple appointments in the same week. [x]   If you have transportation problems- use our convenient video appointments or ask about transportation support. [x]   We can get down to business faster if you use MyChart to update information before the visit and submit non-urgent questions before your visit. Thank you for taking the time to provide details through MyChart.  Let our nurse know and she can import this information into your encounter documents.  Arrival and Wait Times: [x]   Arriving on time ensures that everyone receives prompt attention. [x]   Early morning (8a) and afternoon (1p) appointments tend to have shortest wait times. [x]   Unfortunately, we cannot delay appointments for late arrivals or hold slots during phone calls.  Getting Answers and Following Up [x]   Simple Questions & Concerns: For quick questions or basic follow-up after your visit, reach us  at [phone] or MyChart messaging. [x]   Complex Concerns: If your concern is more complex, scheduling an appointment might be best. Discuss this with the staff to find the most suitable option. [x]   Lab & Imaging Results: We'll contact you directly if results are abnormal or you don't use MyChart. Most normal results will be on MyChart within 2-3 business days, with a review message from Dr. Jesus. Haven't heard back in 2 weeks? Need results sooner? Contact us  at [phone]. [x]   Referrals: Our referral coordinator will manage specialist referrals. The specialist's office should contact you  within 2 weeks to schedule an appointment. Call us  if you haven't heard from them after 2 weeks.  Staying Connected [x]   MyChart: Activate your MyChart for the fastest way to access results and message us . See the last page of this paperwork for instructions on how to activate.  Bring to Your Next Appointment [x]   Medications: Please bring all your medication bottles to your next appointment to ensure we have an accurate record of your prescriptions. [x]   Health Diaries: If you're monitoring any health conditions at home, keeping a diary of your readings can be very helpful for discussions at your next appointment.  Billing [x]   X-ray & Lab Orders: These are billed by separate companies. Contact the invoicing company directly for questions or concerns. [x]   Visit Charges: Discuss any billing inquiries with our administrative services team.  Your Satisfaction Matters [x]   Share Your Experience: We strive for your satisfaction! If you have any complaints, or preferably compliments, please let Dr. Jesus know directly or contact our Practice Administrators, Manuelita Rubin or [employer], by asking at the front desk.   Reviewing Your Records [x]   Review this early draft of your clinical encounter notes below and the final encounter summary tomorrow on MyChart after its been completed.  All orders placed so far are visible here: Hepatic steatosis -     Wegovy ; Inject 0.5 mg into the skin once a week.  Dispense: 2 mL; Refill: 5 -     Comprehensive metabolic panel with GFR -     Lipid panel -     FIB-4 W/REFLEX TO ELF  Allergic rhinitis, unspecified seasonality, unspecified trigger -     Amoxicillin -Pot Clavulanate; Take 1 tablet  by mouth 2 (two) times daily.  Dispense: 20 tablet; Refill: 0 -     Levocetirizine Dihydrochloride ; Take 1 tablet (5 mg total) by mouth every evening.  Dispense: 30 tablet; Refill: 2  Chronic pansinusitis -     Amoxicillin -Pot Clavulanate; Take 1 tablet by mouth 2  (two) times daily.  Dispense: 20 tablet; Refill: 0  Overweight -     Wegovy ; Inject 0.5 mg into the skin once a week.  Dispense: 2 mL; Refill: 5 -     Lipid panel  Peripheral artery disease -     Wegovy ; Inject 0.5 mg into the skin once a week.  Dispense: 2 mL; Refill: 5 -     Lipid panel  Drug-induced hypokalemia -     Magnesium  Acquired hypothyroidism -     TSH + free T4  Swelling of thyroid  gland -     US  THYROID ; Future  Hematuria, unspecified type -     Ambulatory referral to Urology          ALLERGY MANAGEMENT PLAN  This plan is designed to help manage your allergic rhinitis (nasal allergies) effectively. Follow these steps daily for best results.  Sinus saline sprays- use nightly, and after sneezing episodes or exposure to allergen.  Insert deeply and spray mist into nose while leaning over sink at 45 degrees,  while gently breathing. Also blow out onto tissue while leaning forward 45 degrees. Once daily, after a sinus rinse, use sensimist.  Just before bedtime is best. This only needed if allergies acting up.  If this is inadequate add-on once daily for levocetirizine / xyzal  5 mg for nondrowsy antihistamine Take benadryl  25 mg at bedtime also if allergic mucus is persisting  When allergies cause chronic swelling in sinuses, it leads to sinus infections:    DAILY TREATMENT ROUTINE   Time of Day Treatment Steps  Morning 1. Saline Nasal Spray - Use to cleanse nasal passages 2. Xyzal  (levocetirizine) - Take one tablet daily   Throughout Day Saline Nasal Spray - Use 2 additional times (mid-day and afternoon)   Evening/Bedtime 1. Saline Nasal Rinse - Thoroughly clean nasal passages 2. Flonase  Sensimist - Apply after nasal rinse 3. Benadryl  (diphenhydramine ) - Take 25mg  if experiencing persistent congestion    PROPER TECHNIQUE GUIDE       Saline Nasal Spray/Rinse Technique: Lean forward over sink at a 45-degree angle Turn head slightly to one side Insert  spray tip into upper nostril Spray gently while breathing lightly through your nose Repeat on other side Gently blow nose to clear excess solution Use saline spray 3 times daily to keep nasal passages moist and clear allergens.       Flonase  Sensimist Technique: Shake bottle gently before each use Prime the bottle if it's new or hasn't been used for a week Tilt your head forward slightly Insert tip into nostril, pointing away from the center of your nose Spray while inhaling gently Repeat in other nostril Use Flonase  Sensimist once daily, preferably at bedtime after using saline rinse. It may take several days of regular use to feel maximum benefit.   WHY FLONASE  SENSIMIST?   Benefits of Flonase  Sensimist:  Alcohol-free and scent-free formula - gentler on sensitive nasal passages Fine mist application - more comfortable with less dripping down throat Effectively relieves nasal congestion, sneezing, runny nose, and even eye symptoms 24-hour relief with once-daily dosing Uses a more potent form of fluticasone  that works at a lower dose Less liquid per spray  means less discomfort  UNDERSTANDING YOUR MEDICATIONS   Medication How It Works Important Notes  Flonase  Sensimist (fluticasone  furoate) Reduces inflammation in nasal passages, addressing the underlying cause of allergy symptoms - Takes several days for full effect - Use daily for best results - Safe for long-term use   Xyzal  (levocetirizine) Blocks histamine to reduce allergy symptoms like sneezing and itching - Take at the same time each day - May cause drowsiness in some people - Once-daily dosing   Benadryl  (diphenhydramine ) Antihistamine that provides additional relief for breakthrough symptoms - Causes drowsiness - Use only at bedtime - For occasional use when needed   Saline Spray/Rinse Physically removes allergens and moistens nasal passages - Safe to use frequently - Improves effectiveness of other treatments -  Reduces nasal irritation    CONTACT YOUR PROVIDER IF: Your symptoms do not improve after 1-2 weeks of following this plan You develop sinus pain with fever or green/yellow discharge You experience frequent nosebleeds You develop new or worsening symptoms You have questions about your treatment plan     ADDITIONAL ALLERGY MANAGEMENT TIPS   HELPFUL STRATEGIES: ?? Keep windows closed during high pollen seasons ??? Use allergen-proof covers for pillows and mattresses ?? Vacuum regularly with a HEPA filter vacuum ?? Shower and change clothes after spending time outdoors ?? Check local pollen counts and limit outdoor time when counts are high ?? Stay well-hydrated to help keep mucous membranes moist     It was a pleasure seeing you today!  Your health and satisfaction are my top priorities. If you believe your experience today was worthy of a 5-star rating, I'd be grateful for your feedback! Bernardino KANDICE Cone, MD      Next Steps: Schedule Follow-Up:   during today's visit, we were unable to cover all the issues that were brought up for consideration.   I appreciate your understanding, and I want to assure you that we take your concerns seriously. If any of these issues become urgent or worsen, please don't hesitate to reach out or seek emergency room care. In the meantime, I recommend scheduling a follow-up appointment soon so we can address all your health needs comprehensively. We recommend your next follow-up appointment no later than No follow-ups on file., and sooner if possible.   Preventive Care:  Don't forget to schedule your annual preventive care visit!  This important checkup is typically covered by insurance and helps identify potential health issues early.  Typically its 100% insurance covered with no co-pay and helps to get surveillance labwork paid for.  Lab & X-ray Appointments:  Scheduled any incomplete lab tests today or call us  to schedule.  XRays can be done without an  appointment at [facility] at [facility] ([address]), M-F 8:30am-noon or 1pm-5pm.  Just tell them you're there for X-rays ordered by Dr. Cone.  We'll receive the results and contact you by phone or MyChart to discuss next steps.  Medical Information Release:  If you have any relevant medical information we don't have, please sign a release form so we can obtain it for your records.  Bring to Your Next Appointment: Medications: Please bring all your medication bottles to your next appointment to ensure we have an accurate record of your prescriptions. Health Diaries: If you're monitoring any health conditions at home, keeping a diary of your readings can be very helpful for discussions at your next appointment.  Please Review your early draft clinical notes below and the final  encounter summary tomorrow on MyChart after its been completed.   Allergic rhinitis, unspecified seasonality, unspecified trigger  Chronic pansinusitis     Getting Answers and Following Up: Simple Questions & Concerns: For quick questions or basic follow-up after your visit, reach us  at [phone] or MyChart messaging. Complex Concerns: If your concern is more complex, scheduling an appointment might be best. Discuss this with the staff to find the most suitable option. Lab & Imaging Results: We'll contact you directly if results are abnormal or you don't use MyChart. Most normal results will be on MyChart within 2-3 business days, with a review message from Dr. Jesus. Haven't heard back in 2 weeks? Need results sooner? Contact us  at [phone]. Referrals: Our referral coordinator will manage specialist referrals. The specialist's office should contact you within 2 weeks to schedule an appointment. Call us  if you haven't heard from them after 2 weeks.  Staying Connected:  MyChart: Activate your MyChart for the fastest way to access results and message us . See the last page of this  paperwork for instructions.  Billing: X-ray & Lab Orders: These are billed by separate companies. Contact the invoicing company directly for questions or concerns. Visit Charges: Discuss any billing inquiries with our administrative services team.  Feedback & Satisfaction: Share Your Experience: We strive for your satisfaction! If you have any complaints, please let Dr. Jesus know directly or contact our Practice Administrators, Manuelita Rubin or [employer], by asking at the front desk.  Scheduling Tips: Shorter Wait Times: 8 am and 1 pm appointments often have the quickest wait times. Longer Appointments: If you need more time during your visit, talk to the front desk. Due to insurance regulations, multiple back-to-back appointments might be necessary. It was a pleasure seeing you today!  Your health and satisfaction are my top priorities. If you believe your experience today was worthy of a 5-star rating, I'd be grateful for your feedback! Bernardino KANDICE Jesus, MD      Next Steps: Schedule Follow-Up:   during today's visit, we were unable to cover all the issues that were brought up for consideration.   I appreciate your understanding, and I want to assure you that we take your concerns seriously. If any of these issues become urgent or worsen, please don't hesitate to reach out or seek emergency room care. In the meantime, I recommend scheduling a follow-up appointment soon so we can address all your health needs comprehensively. We recommend your next follow-up appointment no later than No follow-ups on file., and sooner if possible.   Preventive Care:  Don't forget to schedule your annual preventive care visit!  This important checkup is typically covered by insurance and helps identify potential health issues early.  Typically its 100% insurance covered with no co-pay and helps to get surveillance labwork paid for.  Lab & X-ray Appointments:  Scheduled any incomplete lab tests today or  call us  to schedule.  XRays can be done without an appointment at [facility] at [facility] ([address], [address]), M-F 8:30am-noon or 1pm-5pm.  Just tell them you're there for X-rays ordered by Dr. Jesus.  We'll receive the results and contact you by phone or MyChart to discuss next steps.  Medical Information Release:  If you have any relevant medical information we don't have, please sign a release form so we can obtain it for your records.  Bring to Your Next Appointment: Medications: Please bring all your medication bottles to your next appointment to  ensure we have an accurate record of your prescriptions. Health Diaries: If you're monitoring any health conditions at home, keeping a diary of your readings can be very helpful for discussions at your next appointment.  Please Review your early draft clinical notes below and the final encounter summary tomorrow on MyChart after its been completed.   Allergic rhinitis, unspecified seasonality, unspecified trigger  Chronic pansinusitis     Getting Answers and Following Up: Simple Questions & Concerns: For quick questions or basic follow-up after your visit, reach us  at [phone] or MyChart messaging. Complex Concerns: If your concern is more complex, scheduling an appointment might be best. Discuss this with the staff to find the most suitable option. Lab & Imaging Results: We'll contact you directly if results are abnormal or you don't use MyChart. Most normal results will be on MyChart within 2-3 business days, with a review message from Dr. Jesus. Haven't heard back in 2 weeks? Need results sooner? Contact us  at [phone]. Referrals: Our referral coordinator will manage specialist referrals. The specialist's office should contact you within 2 weeks to schedule an appointment. Call us  if you haven't heard from them after 2 weeks.  Staying Connected:  MyChart: Activate your MyChart for the fastest way to access results  and message us . See the last page of this paperwork for instructions.  Billing: X-ray & Lab Orders: These are billed by separate companies. Contact the invoicing company directly for questions or concerns. Visit Charges: Discuss any billing inquiries with our administrative services team.  Feedback & Satisfaction: Share Your Experience: We strive for your satisfaction! If you have any complaints, please let Dr. Jesus know directly or contact our Practice Administrators, Manuelita Rubin or [employer], by asking at the front desk.  Scheduling Tips: Shorter Wait Times: 8 am and 1 pm appointments often have the quickest wait times. Longer Appointments: If you need more time during your visit, talk to the front desk. Due to insurance regulations, multiple back-to-back appointments might be necessary.

## 2024-10-30 NOTE — Progress Notes (Signed)
 Dear Dr. Jesus, Here is my assessment for our mutual patient, Martha Orr. Thank you for allowing me the opportunity to care for your patient. Please do not hesitate to contact me should you have any other questions. Sincerely, Dr. Eldora Blanch  Otolaryngology Clinic Note  HISTORY: Martha Orr is a 68 y.o. female kindly referred by Dr. Jesus for evaluation of OSA and sinusitis  Initial visit (08/2024): She reports that she has chronic sinus issues for which symptoms include anterior rhinorrhea, sniffing, congestion and lot of mucous production. She also describes some headaches. She has had these symptoms for many years, but just lived with it. Symptoms are perennial.   She does have history of sinus infections -- symptoms include malaise, maxillary pressure, and headaches, mild discolored drainage -- for which she does get antibiotics. Her last course of antibiotics was in late August (Augmentin ), which did not help. She does not know how many rounds of antibiotics she gets in a year.   She has tried and is currently using flonase  (at night), and Allegra (PRN). She has not tried saline rinses. Unclear what causes improvement.  Allergy testing has been done long time ago - does not remember what she was allergic to. No previous sinus surgery.  She is using CPAP every night. Able to tolerate it most of the night.   She did have nasal surgery (~30 years ago) - notes she had a knot in her dorsum and she had it shaved down. Unclear if had septoplasty  --------------------------------------------------------- 10/30/2024 She continues to have issues with her sinuses including pressure, congestion, discolored drainage. Reporting anterior rhinorrhea and some post nasal drip. Using flonase  and astelin  - helped some. She felt like abx/steroids maybe helped some, but not significantly.  She did have CT today.  We had a long discussion about her options.   GLP-1: yes AP/AC:  no  Tobacco: no  H&N Surgery: tonsillectomy, nasal surgery  PMHx: Atherosclerosis, OSA on CPAP, HTN, Hypothyroidism, HLD, MDD/GAD, RLS(?), CAD  RADIOGRAPHIC EVALUATION AND INDEPENDENT REVIEW OF OTHER RECORDS:: Dr. Jesus (08/23/2024): OSA with suboptimal CPAP response; using CPAP and recently had sleep study; Chronic Sinusitis; noted prior nasal surgery, allergic rhinitis; Rx: saline spray, nasal irrigation and flonase , augmentin ; ref to ENT Dr. Buck (04/17/2024) Sleep study interpreted: AHI 22.9, O2 nadir 74% Labs CBC and CMP and Ferritin 04/26/2024: BUN/Cr 17/0.72; WBC 5.7, Hgb 12.8, Eos 400, Ferritin wnl MRI Brain 03/25/2021 independently interpreted with respect to sinuses: septum mild dev right, mild MPT right frontal and scattered right ethmoid disease CT Sinus (10/22/2024) independently interpreted: septum relatively midline, noted bilateral maxillary sinus moderate opacification with bilateral anterior ethmoid R>L opacification; left sphenoid with bubbly opacity/fluid.   Past Medical History:  Diagnosis Date   Abdominal pain, chronic, right lower quadrant 09/27/2022   68 year old female Abdominal pain across her entire right side but has remote removal of uterus, appendix, and gallbladder. Pain started 10/07/22.  Nausea and abdominal pain on the right, often occurs after eating.  Does not have any diarrhea or constipation.  Does not improve with stool softening.  Seems worse after large meal.  Partial response to bentyl .   Is not affected by urinating does have   Anxiety    Arthritis    Chronic back pain    buldging disc   Depression    takes Lexapro  daily   Family history of adverse reaction to anesthesia    sister gets sick after anesthesia   Gallstones    Hepatic steatosis 10/04/2022  High cholesterol    takes Crestor  daily   History of appendectomy 10/04/2022   History of cholecystectomy 10/04/2022   History of colon polyps    benign   History of hysterectomy 10/04/2022    History of shingles    Hypertension    Hypothyroidism    takes Synthroid  daily   Joint pain    Joint swelling    Microscopic hematuria    states her entire life and family is the same way   Nausea 09/27/2022   Past Surgical History:  Procedure Laterality Date   ABDOMINAL HYSTERECTOMY  1998   APPENDECTOMY     BREAST BIOPSY     BUNIONECTOMY Bilateral    CARPAL TUNNEL RELEASE Bilateral    CHOLECYSTECTOMY     COLONOSCOPY     KNEE ARTHROSCOPY Right    KNEE ARTHROSCOPY Left 11/01/2017   Procedure: ARTHROSCOPY LEFT KNEE;  Surgeon: Sheril Coy, MD;  Location: [facility] OR;  Service: Orthopedics;  Laterality: Left;   knot removed from right breast     LAPAROSCOPY N/A 04/15/2023   Procedure: LAPAROSCOPY DIAGNOSTIC;  Surgeon: Kallie Manuelita BROCKS, MD;  Location: [facility];  Service: General;  Laterality: N/A;   NASAL SINUS SURGERY     TONSILLECTOMY     TONSILLECTOMY AND ADENOIDECTOMY  2002   TOTAL KNEE ARTHROPLASTY Bilateral 10/12/2016   Procedure: TOTAL KNEE BILATERAL;  Surgeon: Coy Sheril, MD;  Location: [facility] OR;  Service: Orthopedics;  Laterality: Bilateral;   Family History  Problem Relation Age of Onset   Heart attack Mother    Hyperlipidemia Mother    Arthritis Father    Arthritis Sister    Sleep apnea Sister    Arthritis Sister    Heart attack Sister    Arthritis Sister    Hyperlipidemia Sister    Stroke Sister    Sleep apnea Sister    Sleep apnea Brother    Cancer Brother    Depression Brother    Heart attack Brother    Heart disease Brother    Hypertension Brother    Hyperlipidemia Brother    Cancer Brother    Heart attack Brother    Colon cancer Neg Hx    Esophageal cancer Neg Hx    Rectal cancer Neg Hx    Stomach cancer Neg Hx    Social History   Tobacco Use   Smoking status: Never   Smokeless tobacco: Never  Substance Use Topics   Alcohol use: Yes    Comment: occasionally   No Known Allergies Current Outpatient Medications  Medication Sig Dispense  Refill   acetaminophen  (TYLENOL ) 325 MG tablet Take 325 mg by mouth every 6 (six) hours as needed for moderate pain.     amoxicillin -clavulanate (AUGMENTIN ) 875-125 MG tablet Take 1 tablet by mouth 2 (two) times daily. 20 tablet 0   azelastine  (ASTELIN ) 0.1 % nasal spray Place 2 sprays into both nostrils at bedtime. Use in each nostril as directed 30 mL 12   Biotin w/ Vitamins C & E (HAIR/SKIN/NAILS PO) Take 1 capsule by mouth daily.     cholecalciferol (VITAMIN D ) 1000 units tablet Take 1,000 Units by mouth at bedtime.     escitalopram  (LEXAPRO ) 20 MG tablet TAKE 1 TABLET DAILY 90 tablet 3   fluticasone  (FLONASE ) 50 MCG/ACT nasal spray Place 2 sprays into both nostrils daily as needed for allergies.     fluticasone  (FLONASE ) 50 MCG/ACT nasal spray Place 2 sprays into both nostrils daily. 16 g 6   Ginkgo  Biloba Extract 60 MG CAPS Take 1 capsule by mouth daily.     icosapent  Ethyl (VASCEPA ) 1 g capsule Take 2 capsules (2 g total) by mouth 2 (two) times daily. 360 capsule 3   ipratropium (ATROVENT) 0.06 % nasal spray Place 2 sprays into both nostrils 2 (two) times daily as needed. 15 mL 12   levocetirizine (XYZAL ) 5 MG tablet Take 1 tablet (5 mg total) by mouth every evening. 30 tablet 2   levothyroxine  (SYNTHROID ) 88 MCG tablet Take 1 tablet (88 mcg total) by mouth daily. 90 tablet 3   ondansetron  (ZOFRAN -ODT) 4 MG disintegrating tablet Take 1 tablet (4 mg total) by mouth every 8 (eight) hours as needed for nausea or vomiting. 20 tablet 2   rosuvastatin  (CRESTOR ) 40 MG tablet Take 1 tablet (40 mg total) by mouth at bedtime. 90 tablet 3   Saline (SIMPLY SALINE) 0.9 % AERS Place 2 each into the nose as directed. Use nightly for sinus hygiene long-term.  Can also be used as many times daily as desired to assist with clearing congested sinuses. 127 mL 11   semaglutide -weight management (WEGOVY ) 0.5 MG/0.5ML SOAJ SQ injection Inject 0.5 mg into the skin once a week. 2 mL 5   Current  Facility-Administered Medications  Medication Dose Route Frequency Provider Last Rate Last Admin   acidophilus (RISAQUAD) capsule 1 capsule  1 capsule Oral Daily Jesus Bernardino MATSU, MD       BP (!) 86/57 (BP Location: Left Arm, Patient Position: Sitting, Cuff Size: Large)   Pulse 95   Ht 5' 6 (1.676 m)   Wt 178 lb (80.7 kg)   SpO2 94%   BMI 28.73 kg/m   PHYSICAL EXAM:  BP (!) 86/57 (BP Location: Left Arm, Patient Position: Sitting, Cuff Size: Large)   Pulse 95   Ht 5' 6 (1.676 m)   Wt 178 lb (80.7 kg)   SpO2 94%   BMI 28.73 kg/m    Salient findings:  CN II-XII intact Bilateral EAC clear and TM intact with well pneumatized middle ear spaces Nose: Anterior rhinoscopy reveals mild deviation right septum, bilateral inferior turbinate modest hypertrophy. No external nasal lesions suggestive of prior rhinoplasty; able to palpate septal cartilage as well and does not appear she has had a septoplasty No lesions of oral cavity/oropharynx No respiratory distress or stridor   PROCEDURE (Prior, not today):  Prior to initiating any procedures, risks/benefits/alternatives were explained to the patient and verbal consent obtained. Diagnostic Nasal Endoscopy Pre-procedure diagnosis: Concern for sinusitis; nasal congestion, rhinorrhea Post-procedure diagnosis: same Indication: See pre-procedure diagnosis and physical exam above Complications: None apparent EBL: 0 mL Anesthesia: Lidocaine  4% and topical decongestant was topically sprayed in each nasal cavity  Description of Procedure:  Patient was identified. A rigid 30 degree endoscope was utilized to evaluate the sinonasal cavities, mucosa, sinus ostia and turbinates and septum.  Overall, signs of mucosal inflammation are noted. No polyps, or masses noted.   Right Middle meatus: clear Right SE Recess: clear Left MM: thick purulence from max Left SE Recess: clear  Photodocumentation was obtained.  CPT CODE -- 31231 - Mod  25   ASSESSMENT:  68 y.o. with:  1. Other chronic sinusitis   2. Nasal congestion   3. Rhinorrhea   4. Hypertrophy of both inferior nasal turbinates   5. Nasal obstruction   6. Vasomotor rhinitis    Although her symptoms sound more allergic, prior endoscopy showing purulence from left MM. She has persistent disease despite medical  management and as such, we did discuss limited FESS. She is also having fair amount of rhinorrhea and did discuss clarifix for this for which she is interested  We discussed the goals of sinus surgery, and expectations for postoperative management. We discussed R/B/A including pain, infection, bleeding (~2% risk of operative visit for control), persistent symptoms, need for revision surgery, and other risks including damage to the eye, and injury to skull base, anesthetic complications, persistent symptoms, ice cream headache, among others.  Patient understands and is ready to proceed.  I did discuss that this is not guaranteed to help her, especially would not think it would help with headache and post nasal drip. She understands this.   PLAN: We've discussed issues and options today.  We reviewed the nasal endoscopy images together.  The risks, benefits and alternatives were discussed and questions answered.  She has elected to proceed with:  1) Will add atrovent BID for dripping 2) Continue Flonase  and astelin  BID - can put in rinse  Stop Gingko Biloba 2 weeks before surgery; stop Wegovy  1 week before surgery Pre-op phone visit 1 week before surgery  F/u POD 7 after b/l FESS, turbs, posterior nasal nerve ablation  See below regarding exact medications prescribed this encounter including dosages and route: Meds ordered this encounter  Medications   ipratropium (ATROVENT) 0.06 % nasal spray    Sig: Place 2 sprays into both nostrils 2 (two) times daily as needed.    Dispense:  15 mL    Refill:  12     Thank you for allowing me the opportunity to care  for your patient. Please do not hesitate to contact me should you have any other questions.  Sincerely, Eldora Blanch, MD Otolaryngologist (ENT), [facility] Phone: [phone] Fax: [phone]  MDM:  Level 4: 99214 Complexity/Problems addressed: chronic problems Data complexity: mod - independent CT interpretation - Morbidity: mod - decision for surgery - Prescription Drug prescribed or managed: y  10/30/2024, 12:59 PM

## 2024-10-30 NOTE — Progress Notes (Signed)
 ==============================  [facility]: [id number]   -- Medical Office Visit --  Patient: Martha Orr      Age: 68 y.o.       Sex:  female  Date:   10/30/2024 Today's Healthcare Provider: Bernardino KANDICE Cone, MD  ==============================   Chief Complaint: Chronic disease monitoring - follow up Wegovy  and bloodwork.  Discussed the use of AI scribe software for clinical note transcription with the patient, who gave verbal consent to proceed.   Pre-visit Analysis/Case-Review 68 year old female with complex medical history including thoracic aortic aneurysm (stable, 4.2 cm), hypertension, dyslipidemia, obesity (BMI ~29), hypothyroidism, hepatic steatosis, chronic hematuria, bilateral knee osteoarthritis, major depressive disorder (in remission), anxiety, and history of abdominal/pelvic surgeries (hysterectomy, cholecystectomy, colon polyp removal, carpal tunnel surgery). She is on multiple chronic medications including levothyroxine , rosuvastatin , hydrochlorothiazide , Wegovy  (semaglutide ), and icosapent  ethyl. History of Present Illness  68 year old female who presents for a follow-up on blood work and Wegovy  use.    She is currently on the lowest dose of Wegovy , having been on 0.25 mg for two months, and is now transitioning to 0.5 mg. Her weight has decreased while on Wegovy . She is on Medicare and has concerns about insurance coverage for her medication.  She has a history of thoracic aortic aneurysm, which is stable, and is managing high blood pressure and cholesterol. She is on hydrochlorothiazide  and has had low potassium levels. She takes levothyroxine  for thyroid  management.  No current urinary tract symptoms. No frequent urination, incomplete emptying, back and leg discomfort, fever, or UTI symptoms.  She experiences chronic sinus issues, including headaches, nasal congestion, and rhinorrhea. A recent CT scan showed sinus  inflammation. She occasionally uses nasal spray but dislikes it. She has had sinus surgery in the past, which provided some relief, but symptoms have returned.  She is concerned about a thyroid  enlargement and is interested in monitoring it with an ultrasound.  No new cardiac, GI, or mood symptoms reported.  Top Issues & Follow-Up Flags Urinary symptoms: Recent onset of urinary frequency, incomplete emptying, and back/leg discomfort; UA shows 1+ blood, protein positive, but no leukocytes/nitrites; urine culture <10,000 CFU Gram-positive organism (likely not UTI, but symptoms persist). Sinusitis: CT shows mild-moderate bilateral paranasal sinus inflammation (active sinusitis), retained nasal secretions, possible rhinitis; ENT follow-up scheduled later today Electrolyte/lipid abnormalities: Low potassium (3.1), high triglycerides (183), borderline elevated glucose (108); on HCTZ and statin.  Hypertension with hypokalemia on hydrochloroTHIAZIDE   Lab Results  Component Value Date   K 3.1 (L) 09/24/2024   K 3.7 04/26/2024   K 3.3 (L) 11/22/2023   K 3.2 (L) 05/30/2023   K 3.1 (L) 04/14/2023   K 3.5 09/27/2022   K 3.9 02/09/2022   K 3.7 04/14/2021   K 4.3 02/06/2021   K 3.3 (L) 06/05/2020   K 3.9 11/13/2019   K 3.7 10/26/2017   K 3.8 10/13/2016   K 3.9 10/04/2016   Chronic hematuria: Ongoing microscopic hematuria; urology referral recommended but not documented as completed; UA still abnormal. Obesity/weight management: On Wegovy  with recent dose increase; weight up 5 lbs since March; ongoing management. Aortic aneurysm & atherosclerosis: Stable, annual imaging due August 2025; BP well controlled; lipid management may need optimization. Hypothyroidism: TSH and FT4 currently normal on reduced levothyroxine  dose; monitor for symptoms. Recheck levels Medication review: Polypharmacy; ensure adherence and check for interactions/adverse effects. Lab Results  Component Value Date   TSH 0.105  (L) 04/26/2024   TSH  0.965 05/30/2023   TSH 1.19 02/09/2022   TSH 1.89 04/14/2021   TSH 2.89 02/06/2021   TSH 1.92 11/13/2019   Risks / Red Flags  Urinary symptoms with hematuria and proteinuria: Could indicate early renal disease, persistent UTI, or other pathology (malignancy, nephropathy); needs further evaluation. Electrolyte disturbance: Hypokalemia (on HCTZ); risk for arrhythmia, muscle weakness. Obesity and metabolic syndrome: Increased CV risk, especially with aortic aneurysm and dyslipidemia. Chronic sinusitis: Risk for complications or chronicity; active inflammation on CT. Polypharmacy: Potential for drug interactions (HCTZ/low potassium, statin/muscle symptoms, antidepressant/other CNS effects). Aortic aneurysm: Stable but needs ongoing BP/lipid control; monitor for symptoms.  Background Reviewed: Problem List: has Bilateral primary osteoarthritis of knee; Major depressive disorder, single episode, in remission; Anxiety; Dyslipidemia; Hypothyroidism; H/O cold sores; Osteoarthritis; Restless legs; Pulmonary nodules; Otalgia of both ears; Hypertension; Hyperglycemia; Hematuria; Thoracic aortic aneurysm; Allergic rhinitis; Nausea; Hepatic steatosis; Cough, persistent; Gastroesophageal reflux disease without esophagitis; Globus pharyngeus; Lumbar arthropathy; Occult blood in stools; Hiatal hernia; History of colon polyps; Hemorrhoid; Obesity (BMI 30-39.9); Snoring; Hand pain, left; and Atherosclerosis of aorta on their problem list. Past Medical History:  has a past medical history of Abdominal pain, chronic, right lower quadrant (09/27/2022), Anxiety, Arthritis, Chronic back pain, Depression, Family history of adverse reaction to anesthesia, Gallstones, Hepatic steatosis (10/04/2022), High cholesterol, History of appendectomy (10/04/2022), History of cholecystectomy (10/04/2022), History of colon polyps, History of hysterectomy (10/04/2022), History of shingles, Hypertension,  Hypothyroidism, Joint pain, Joint swelling, Microscopic hematuria, and Nausea (09/27/2022). Past Surgical History:   has a past surgical history that includes Appendectomy; Carpal tunnel release (Bilateral); Tonsillectomy; Bunionectomy (Bilateral); Knee arthroscopy (Right); Cholecystectomy; Nasal sinus surgery; Colonoscopy; knot removed from right breast; Total knee arthroplasty (Bilateral, 10/12/2016); Knee arthroscopy (Left, 11/01/2017); Breast biopsy; Tonsillectomy and adenoidectomy (2002); Abdominal hysterectomy (1998); and laparoscopy (N/A, 04/15/2023). Social History:   reports that she has never smoked. She has never used smokeless tobacco. She reports current alcohol use. She reports that she does not use drugs. Family History:  family history includes Arthritis in her father, sister, sister, and sister; Cancer in her brother and brother; Depression in her brother; Heart attack in her brother, brother, mother, and sister; Heart disease in her brother; Hyperlipidemia in her brother, mother, and sister; Hypertension in her brother; Sleep apnea in her brother, sister, and sister; Stroke in her sister. Allergies:  has no known allergies.   Medication Reconciliation: Current Outpatient Medications on File Prior to Visit  Medication Sig  . acetaminophen  (TYLENOL ) 325 MG tablet Take 325 mg by mouth every 6 (six) hours as needed for moderate pain.  . azelastine  (ASTELIN ) 0.1 % nasal spray Place 2 sprays into both nostrils at bedtime. Use in each nostril as directed  . Biotin w/ Vitamins C & E (HAIR/SKIN/NAILS PO) Take 1 capsule by mouth daily.  . cholecalciferol (VITAMIN D ) 1000 units tablet Take 1,000 Units by mouth at bedtime.  . escitalopram  (LEXAPRO ) 20 MG tablet TAKE 1 TABLET DAILY  . fluticasone  (FLONASE ) 50 MCG/ACT nasal spray Place 2 sprays into both nostrils daily as needed for allergies.  . fluticasone  (FLONASE ) 50 MCG/ACT nasal spray Place 2 sprays into both nostrils daily.  . Ginkgo Biloba  Extract 60 MG CAPS Take 1 capsule by mouth daily.  . icosapent  Ethyl (VASCEPA ) 1 g capsule Take 2 capsules (2 g total) by mouth 2 (two) times daily.  . levothyroxine  (SYNTHROID ) 88 MCG tablet Take 1 tablet (88 mcg total) by mouth daily.  . ondansetron  (ZOFRAN -ODT) 4 MG disintegrating tablet Take  1 tablet (4 mg total) by mouth every 8 (eight) hours as needed for nausea or vomiting.  . rosuvastatin  (CRESTOR ) 40 MG tablet Take 1 tablet (40 mg total) by mouth at bedtime.  . Saline (SIMPLY SALINE) 0.9 % AERS Place 2 each into the nose as directed. Use nightly for sinus hygiene long-term.  Can also be used as many times daily as desired to assist with clearing congested sinuses.   Current Facility-Administered Medications on File Prior to Visit  Medication  . acidophilus (RISAQUAD) capsule 1 capsule   Medications Discontinued During This Encounter  Medication Reason  . semaglutide -weight management (WEGOVY ) 0.5 MG/0.5ML SOAJ SQ injection   . hydrochlorothiazide  (MICROZIDE ) 12.5 MG capsule Completed Course  . amoxicillin -clavulanate (AUGMENTIN ) 875-125 MG tablet Reorder  . loratadine  (CLARITIN ) 10 MG tablet   . pseudoephedrine  (SUDAFED 12 HOUR) 120 MG 12 hr tablet   . phenazopyridine (QC AZO) 95 MG tablet      Physical Exam:    10/30/2024    7:57 AM 09/24/2024    9:57 AM 09/18/2024    9:06 AM  Vitals with BMI  Height 5' 6 5' 6 5' 6  Weight 178 lbs 3 oz 178 lbs 6 oz 182 lbs  BMI 28.78 28.81 29.39  Systolic 110 120 96  Diastolic 72 64 62  Pulse 77 69 89  Vital signs reviewed.  Nursing notes reviewed. Weight trend reviewed. Physical Activity: Inactive (10/29/2024)   Exercise Vital Sign   . Days of Exercise per Week: 0 days   . Minutes of Exercise per Session: Not on file   General Appearance:  No acute distress appreciable.   Well-groomed, healthy-appearing female.  Well proportioned with no abnormal fat distribution.  Good muscle tone. Pulmonary:  Normal work of breathing at rest, no  respiratory distress apparent. SpO2: 98 %  Musculoskeletal: All extremities are intact.  Neurological:  Awake, alert, oriented, and engaged.  No obvious focal neurological deficits or cognitive impairments.  Sensorium seems unclouded.   Speech is clear and coherent with logical content. Psychiatric:  Appropriate mood, pleasant and cooperative demeanor, thoughtful and engaged during the exam  Results: UA: 1+ blood, protein positive, no leukocytes/nitrites. Urine culture: <10,000 CFU Gram-positive, likely contaminant. CT sinuses: Active sinusitis/rhinitis. Labs: Low potassium, high triglycerides, mild hyperglycemia. Vitals: BP at goal; BMI ~29. Results RADIOLOGY Sinus CT: Sinusitis  CT SINUS WO CONTRAST Result Date: 10/26/2024 EXAM: CT Sinus without contrast 10/22/2024 08:44:53 AM TECHNIQUE: CT sinuses without contrast. Dose reduction techniques were achieved by using automated exposure control and/or adjustment of mA and/or kV according to patient size and/or use of iterative reconstruction technique. COMPARISON: Brain MRI 03/25/2021. CLINICAL HISTORY: 68 year old female with recurrent sinusitis. 1 mm cuts requested. FINDINGS: BRAIN/ORBITS/SCALP/FACE: Negative for age visible non-contrast brain parenchyma, orbit soft tissues, scalp soft tissues, and visible non-contrast deep soft tissue spaces of the face. DENTAL: Previous bilateral posterior maxillary dental root canal changes. No definite acute maxillary dental finding. BONES: Normal background bone mineralization. 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 Associated chronic anterior C1 odontoid degeneration. FRONTAL: Well aerated bilateral frontal sinuses. Patent frontoethmoidal recesses. MAXILLARY: Similar mild to moderate circumferential mucosal thickening in both maxillary sinuses and bilateral maxillary sinus bubbly opacity. [facility] is remain patent. ETHMOID: Anterior ethmoidal artery position suspected on  coronal image 29 with trace pneumatization adjacent to both notches. The ethmoid roofs are symmetric. The lamina papyracea are intact. The optic nerve canals are intact. Olfactory fossa is Keros type 2. Scattered bilateral ethmoid  sinus mucosal thickening is mild. SPHENOID: Hyperplastic sphenoid sinuses but no clinoid process pneumatization. Similar mild circumferential mucosal thickening in both sphenoid sinuses, and superimposed polypoid bubbly opacity in the anterior left sphenoid (series 2 image 57). The carotid canals are intact. No Onodi cell. NASAL CAVITY: Nasal septum is intact and is midline. Nasal cavity mucosal pattern within normal limits, but scattered bilateral nasal cavity retained secretions. Olfactory recesses are clear. OTHER: The mastoid air cells and middle ear cavities are well aerated. No acute soft tissue abnormality. IMPRESSION: 1. Mild to moderate fairly symmetric bilateral paranasal sinus inflammation with bubbly opacity compatible with active sinusitis. No complicating features by CT. 2. Nasal cavity retained secretions raising the possibility of associated rhinitis. Electronically signed by: Helayne Hurst MD 10/26/2024 10:07 AM EDT RP Workstation: [id number]         ASSESSMENT & PLAN   A:  Urinary symptoms, unclear etiology (possible early UTI, nephropathy, or other). Chronic hematuria, needs urology follow-up. Active sinusitis/rhinitis, ENT follow-up pending. Hypokalemia, likely HCTZ-related. Obesity/metabolic syndrome, on Wegovy . Stable thoracic aortic aneurysm; CV risk management ongoing. Polypharmacy, need to review for interactions/adherence.  P:  Repeat UA and renal function tests; consider urine cytology. Urology referral if not yet completed. Monitor potassium; consider dietary changes or HCTZ adjustment. ENT follow-up for sinusitis; continue nasal hygiene, review allergy meds. Medication reconciliation and adherence check. Monitor BP, weight, and hypothyroid  symptoms. Address overdue health maintenance (AWV, Shingrix ). Patient education re: red flag symptoms (hematuria, chest pain, neurological changes). Assessment & Plan Allergic rhinitis, unspecified seasonality, unspecified trigger  Chronic pansinusitis Chronic pansinusitis and allergic rhinitis   She experiences chronic sinus inflammation with headaches, nasal congestion, and post-nasal drip. A previous CT scan showed sinus inflammation. Sinus rinses are recommended but not consistently used. An ENT referral is in place for further management, and potential sinus surgery was discussed to improve drainage and reduce symptoms. Prescribed Augmentin  for sinus infection and Xyzal  for allergy management. Encourage daily sinus rinses. Continue follow-up with ENT for further management. Hepatic steatosis Fatty liver disease   Previously noted on tests, Wegovy  therapy may help eliminate fatty liver disease. Continue Wegovy  therapy. Overweight Obesity and weight management with Wegovy  therapy   She is currently on Wegovy  0.5 mg for weight management, though insurance coverage is uncertain despite documented medical necessity. Wegovy  reduces stroke risk by 25%, heart attack risk by 50%, eliminates fatty liver disease, and reduces diabetes risk. Emphasized slow and steady weight loss to avoid adverse effects such as 'Wegovy  face'. Continue Wegovy  0.5 mg weekly. Encourage resistance training to prevent 'Wegovy  face'. Documented medical necessity for Wegovy  to support insurance appeal. Peripheral artery disease Aortic aneurysym  at risk of growth if blood pressure not tightly controlled Blood pressure has improved with Wegovy  -> weight loss  Continue(s) with Wegovy  treatment(s) for peripheral arterial disease FDA approval. Drug-induced hypokalemia Hypokalemia secondary to hydrochlorothiazide  therapy   Hypokalemia is likely secondary to hydrochlorothiazide  use. Blood pressure is well-controlled, and weight  loss may have contributed to improved blood pressure control. Discussed discontinuing hydrochlorothiazide  to reduce pill burden and monitor potassium levels. Discontinued hydrochlorothiazide , rechecked potassium levels, and advised monitoring blood pressure at home with a report in 3-6 months. Acquired hypothyroidism Swelling of thyroid  gland Thyroid  disorder with goiter   She has a thyroid  disorder with goiter, and previous thyroid  function tests were off target. The goiter may be related to iodine deficiency. Discussed the potential need for a thyroid  ultrasound to assess goiter size and characteristics. Ordered thyroid  ultrasound and  rechecked thyroid  function tests. Hematuria, unspecified type Refer care to urology, nonsymptomatic   ORDER ASSOCIATIONS  #   DIAGNOSIS / CONDITION ICD-10 ENCOUNTER ORDER     ICD-10-CM   1. Hepatic steatosis  K76.0 semaglutide -weight management (WEGOVY ) 0.5 MG/0.5ML SOAJ SQ injection    Comprehensive metabolic panel with GFR    Lipid panel    FIB-4 W/REFLEX TO ELF    2. Allergic rhinitis, unspecified seasonality, unspecified trigger  J30.9 amoxicillin -clavulanate (AUGMENTIN ) 875-125 MG tablet    levocetirizine (XYZAL ) 5 MG tablet    3. Chronic pansinusitis  J32.4 amoxicillin -clavulanate (AUGMENTIN ) 875-125 MG tablet    4. Overweight  E66.3 semaglutide -weight management (WEGOVY ) 0.5 MG/0.5ML SOAJ SQ injection    Lipid panel    5. Peripheral artery disease  I73.9 semaglutide -weight management (WEGOVY ) 0.5 MG/0.5ML SOAJ SQ injection    Lipid panel    6. Drug-induced hypokalemia  E87.6 Magnesium   T50.905A CANCELED: Basic Metabolic Panel (BMET)    7. Acquired hypothyroidism  E03.9 TSH + free T4    8. Swelling of thyroid  gland  E07.9 US  THYROID     9. Hematuria, unspecified type  R31.9 Ambulatory referral to Urology           Orders Placed in Encounter:   Meds ordered this encounter  Medications  . semaglutide -weight management (WEGOVY ) 0.5  MG/0.5ML SOAJ SQ injection    Sig: Inject 0.5 mg into the skin once a week.    Dispense:  2 mL    Refill:  5  . amoxicillin -clavulanate (AUGMENTIN ) 875-125 MG tablet    Sig: Take 1 tablet by mouth 2 (two) times daily.    Dispense:  20 tablet    Refill:  0  . levocetirizine (XYZAL ) 5 MG tablet    Sig: Take 1 tablet (5 mg total) by mouth every evening.    Dispense:  30 tablet    Refill:  2    Orders Placed This Encounter  Procedures  . US  THYROID     Standing Status:   Future    Expiration Date:   10/30/2025    Reason for Exam (SYMPTOM  OR DIAGNOSIS REQUIRED):   neck swelling    Preferred imaging location?:   [facility]  . TSH + free T4  . Magnesium  . Comprehensive metabolic panel with GFR  . Lipid panel  . FIB-4 W/REFLEX TO ELF  . Ambulatory referral to Urology    Referral Priority:   Routine    Referral Type:   Consultation    Referral Reason:   Specialty Services Required    Requested Specialty:   Urology    Number of Visits Requested:   1     This document was synthesized by artificial intelligence ([employer]bridge) using HIPAA-compliant recording of the clinical interaction;   We discussed the use of AI scribe software for clinical note transcription with the patient, who gave verbal consent to proceed. additional Info: This encounter employed state-of-the-art, real-time, collaborative documentation. The patient actively reviewed and assisted in updating their electronic medical record on a shared screen, ensuring transparency and facilitating joint problem-solving for the problem list, overview, and plan. This approach promotes accurate, informed care. The treatment plan was discussed and reviewed in detail, including medication safety, potential side effects, and all patient questions. We confirmed understanding and comfort with the plan. Follow-up instructions were established, including contacting the office for any concerns, returning if symptoms worsen, persist, or new  symptoms develop, and precautions for potential emergency department visits.

## 2024-10-30 NOTE — Assessment & Plan Note (Signed)
 Fatty liver disease   Previously noted on tests, Wegovy  therapy may help eliminate fatty liver disease. Continue Wegovy  therapy.

## 2024-10-30 NOTE — Assessment & Plan Note (Signed)
 Thyroid  disorder with goiter   She has a thyroid  disorder with goiter, and previous thyroid  function tests were off target. The goiter may be related to iodine deficiency. Discussed the potential need for a thyroid  ultrasound to assess goiter size and characteristics. Ordered thyroid  ultrasound and rechecked thyroid  function tests.

## 2024-10-30 NOTE — Patient Instructions (Signed)
 Use flonase  and astelin  two sprays each nostril twice per day (one after the other);  For dripping from the nose, use atrovent nasal spray two sprays each nostril twice daily

## 2024-10-30 NOTE — Assessment & Plan Note (Signed)
 Obesity and weight management with Wegovy  therapy   She is currently on Wegovy  0.5 mg for weight management, though insurance coverage is uncertain despite documented medical necessity. Wegovy  reduces stroke risk by 25%, heart attack risk by 50%, eliminates fatty liver disease, and reduces diabetes risk. Emphasized slow and steady weight loss to avoid adverse effects such as 'Wegovy  face'. Continue Wegovy  0.5 mg weekly. Encourage resistance training to prevent 'Wegovy  face'. Documented medical necessity for Wegovy  to support insurance appeal.

## 2024-10-30 NOTE — Assessment & Plan Note (Signed)
 Refer care to urology, nonsymptomatic

## 2024-10-31 ENCOUNTER — Ambulatory Visit (INDEPENDENT_AMBULATORY_CARE_PROVIDER_SITE_OTHER): Admitting: Otolaryngology

## 2024-10-31 LAB — TSH+FREE T4: TSH W/REFLEX TO FT4: 0.88 m[IU]/L (ref 0.40–4.50)

## 2024-11-02 LAB — FIB-4 W/REFLEX TO ELF
ALT: 19 IU/L (ref 0–32)
AST: 22 IU/L (ref 0–40)
FIB-4 Index: 1.54 (ref 0.00–2.67)
Platelets: 230 x10E3/uL (ref 150–450)

## 2024-11-02 LAB — ENHANCED LIVER FIBROSIS (ELF): ELF(TM) Score: 10.92 — AB (ref <9.80)

## 2024-11-04 ENCOUNTER — Ambulatory Visit: Payer: Self-pay | Admitting: Internal Medicine

## 2024-11-04 ENCOUNTER — Encounter: Payer: Self-pay | Admitting: Internal Medicine

## 2024-11-04 DIAGNOSIS — K7400 Hepatic fibrosis, unspecified: Secondary | ICD-10-CM | POA: Insufficient documentation

## 2024-11-05 ENCOUNTER — Ambulatory Visit
Admission: RE | Admit: 2024-11-05 | Discharge: 2024-11-05 | Disposition: A | Discharge status: Home / Self Care | Source: Ambulatory Visit | Attending: Internal Medicine | Admitting: Internal Medicine

## 2024-11-05 DIAGNOSIS — E079 Disorder of thyroid, unspecified: Secondary | ICD-10-CM

## 2024-11-05 NOTE — Telephone Encounter (Signed)
 read by Channing SHAUNNA Bohr at 12:18PM on 11/05/2024.

## 2024-11-08 NOTE — Progress Notes (Signed)
 Your recent thyroid  ultrasound was performed due to neck swelling. The exam showed that your thyroid  gland has a moderately uneven texture throughout, but importantly, there were no distinct lumps or nodules found in either side of your thyroid . This is good news, as it means there are no signs of thyroid  growths or masses.  A moderately heterogenous thyroid  texture can be seen with certain thyroid  conditions, such as chronic inflammation (like Hashimoto's thyroiditis) or other causes of thyroid  dysfunction. Given your history of hypothyroidism, this finding may be related to your known thyroid  condition.  Treatment options at this time include:  Continuing your current thyroid  medication and monitoring your thyroid  function with regular blood tests. Scheduling a follow-up with your healthcare provider to discuss these results and any changes in symptoms. Repeat imaging or referral to an endocrinologist if you develop new symptoms or if your provider feels it is needed.  Based on these results, the recommended option is to continue your current thyroid  management and follow up with your provider as planned. No immediate change in treatment is necessary.  Next steps:  Keep taking your prescribed thyroid  medication. Watch for symptoms such as increased neck swelling, difficulty swallowing, hoarseness, or changes in energy or mood. Notify your provider if you experience any new symptoms. Routine monitoring of your thyroid  levels as previously planned.  No nodules or masses were found, and your thyroid  condition appears stable based on this ultrasound.

## 2024-11-09 ENCOUNTER — Other Ambulatory Visit: Payer: Self-pay | Admitting: Internal Medicine

## 2024-11-09 DIAGNOSIS — R109 Unspecified abdominal pain: Secondary | ICD-10-CM

## 2024-12-21 ENCOUNTER — Other Ambulatory Visit: Payer: Self-pay | Admitting: Internal Medicine

## 2024-12-21 DIAGNOSIS — I739 Peripheral vascular disease, unspecified: Secondary | ICD-10-CM

## 2024-12-21 DIAGNOSIS — E663 Overweight: Secondary | ICD-10-CM

## 2024-12-21 DIAGNOSIS — K760 Fatty (change of) liver, not elsewhere classified: Secondary | ICD-10-CM

## 2024-12-21 MED ORDER — WEGOVY 0.5 MG/0.5ML ~~LOC~~ SOAJ: 0.5000 mg | SUBCUTANEOUS | 5 refills | Status: AC

## 2024-12-21 NOTE — Telephone Encounter (Unsigned)
 Copied from CRM #[id number]. Topic: Clinical - Medication Refill >> Dec 21, 2024  9:27 AM Brittany M wrote: Medication: semaglutide -weight management (WEGOVY ) 0.5 MG/0.5ML SOAJ SQ injection  Has the patient contacted their pharmacy? Yes (Agent: If no, request that the patient contact the pharmacy for the refill. If patient does not wish to contact the pharmacy document the reason why and proceed with request.) (Agent: If yes, when and what did the pharmacy advise?)  This is the patient's preferred pharmacy:   [facility] [address], [place] - [address] [place] [address] Phone: [phone] Fax: [phone]  Is this the correct pharmacy for this prescription? Yes If no, delete pharmacy and type the correct one.   Has the prescription been filled recently? Yes  Is the patient out of the medication? Yes  Has the patient been seen for an appointment in the last year OR does the patient have an upcoming appointment? Yes  Can we respond through MyChart? Yes  Agent: Please be advised that Rx refills may take up to 3 business days. We ask that you follow-up with your pharmacy.

## 2024-12-25 ENCOUNTER — Ambulatory Visit: Admitting: Internal Medicine

## 2024-12-26 ENCOUNTER — Telehealth (HOSPITAL_BASED_OUTPATIENT_CLINIC_OR_DEPARTMENT_OTHER): Payer: Self-pay

## 2024-12-26 NOTE — Telephone Encounter (Signed)
"  ° °  Pre-operative Risk Assessment    Patient Name: Martha Orr  DOB: 09-22-1956 MRN: [id number]   Date of last office visit: 09/20/2023 with Dr. Jeffrie Date of next office visit: 12/31/2024 with Thom Sluder, PA-C  Request for Surgical Clearance    Procedure:  Excision of distal ulna  Date of Surgery:  Clearance 01/04/25                                 Surgeon:  Dr. Francisco Socks Group or Practice Name:  [facility] Phone number:  [phone] Fax number:  [phone]   Type of Clearance Requested:   - Medical    Type of Anesthesia:  Regional block   Additional requests/questions:  None  SignedPatrcia Iverson CROME   12/26/2024, 3:40 PM   "

## 2024-12-26 NOTE — Telephone Encounter (Signed)
 Preop clearance has been added to OV notes for appt on 12/31/24

## 2024-12-26 NOTE — Telephone Encounter (Signed)
" ° °  Name: Martha Orr  DOB: 11/22/1956  MRN: [id number]  Primary Cardiologist: Oneil Parchment, MD  Chart reviewed as part of pre-operative protocol coverage. Because of Martha Orr's past medical history and time since last visit, she will require a follow-up in-office visit in order to better assess preoperative cardiovascular risk.  Pre-op covering staff: - Please schedule appointment and call patient to inform them. If patient already had an upcoming appointment within acceptable timeframe, please add pre-op clearance to the appointment notes so provider is aware. - Please contact requesting surgeon's office via preferred method (i.e, phone, fax) to inform them of need for appointment prior to surgery.  ilable to the clearing provider at time of patient's appointment.   Lamarr Satterfield, NP  12/26/2024, 3:46 PM   "

## 2024-12-28 NOTE — Progress Notes (Signed)
 " Cardiology Office Note:  .   Date:  12/31/2024  ID:  Martha Orr, DOB 02-18-1956, MRN [id number] PCP: No primary care provider on file.  [facility] Providers Cardiologist:  Darryle ONEIDA Decent, MD {  History of Present Illness: .   Martha Orr is a 69 y.o. female with history of aortic dilatation, coronary artery calcifications on CT 03/2020, hyperlipidemia, hypertension.     CAD 03/2020 coronary CTA CAC score 30.5.  Mild plaque in the proximal LAD.  0 to 24%.  EF normal  Palpitations 02/2020 heart monitor sinus rhythm.  Rare PVCs. Short supraventricular runs up to 4-5 beats  Social history  Mother with history of MI in her 12s and brother at the age of 99 No history of tobacco, drug use.  Occasionally drinks.     Patient with history of nonobstructive CAD by coronary CTA and aortic aneurysm measuring 4.4 cm on most recent CTA.  She was last seen 03/2023 and losing weight, no cardiac complaints.  Today patient presents for annual follow-up and with no cardiac complaints today.  She is doing well and although not exercising on a routine basis she has no exertional limitations and feels that she could walk up several flights of steps and up to a mile without any difficulty.  She completes all of her ADLs without any difficulty.  ROS: Denies: Chest pain, shortness of breath, orthopnea, peripheral edema, palpitations, syncope, decreased exercise capacity, fatigue, dizziness.   Studies Reviewed: SABRA    EKG Interpretation Date/Time:  Monday December 31 2024 09:38:42 EST Ventricular Rate:  67 PR Interval:  174 QRS Duration:  88 QT Interval:  484 QTC Calculation: 511 R Axis:   1  Text Interpretation: Normal sinus rhythm Nonspecific T wave abnormality When compared with ECG of 14-Apr-2023 13:27, Nonspecific T wave abnormality, improved in Inferior leads QT has lengthened Confirmed by Darryle Currier [phone]) on 12/31/2024 9:58:02 AM    Risk Assessment/Calculations:            Physical Exam:   VS:  BP 114/76   Pulse 67   Ht 5' 6 (1.676 m)   Wt 176 lb 0.3 oz (79.8 kg)   SpO2 93%   BMI 28.41 kg/m    Wt Readings from Last 3 Encounters:  12/31/24 176 lb 0.3 oz (79.8 kg)  10/30/24 178 lb (80.7 kg)  10/30/24 178 lb 3.2 oz (80.8 kg)    GEN: Well nourished, well developed in no acute distress NECK: No JVD; No carotid bruits CARDIAC: RRR, no murmurs, rubs, gallops RESPIRATORY:  Clear to auscultation without rales, wheezing or rhonchi  ABDOMEN: Soft, non-tender, non-distended EXTREMITIES:  No edema; No deformity   ASSESSMENT AND PLAN: .    Preoperative evaluation Patient with history of nonobstructive CAD that has been stable, she is without any cardiac symptoms that would prompt further testing at this time.  Demonstrates good functional capacity and able to complete greater than 4 METS.  Her RCRI is 0.5%.  Per ACC guidelines there is no indication for further testing at this time.  Procedure:  Excision of distal ulna  Date of Surgery:  01/04/25                              Surgeon:  Dr. Francisco Surgeon's Group or Practice Name:  [facility] Phone number:  [phone] Fax number:  [phone]   Type of Clearance Requested:   - Medical  Type of Anesthesia:  Regional block  CAD -03/2020 coronary CTA CAC score 30.5.  Mild plaque in the proximal LAD.  0 to 24%.  EF normal. Stable EKG, no anginal complaints/equivalents. Continue with rosuvastatin  40 mg daily. LDL at 51 10/2024.  Aortic dilatation 04/2024 CTA stable fusiform aneurysmal dilatation of the ascending thoracic aorta measuring a maximum of 4.4 cm. Needs annual imaging with CT. Her PCP follows this so we will defer to them.  If this lengthens could consider vascular surgery referral.  No family history of dissection.  Hypertension Good control 114/76 today.  She is not on any blood pressure medications.    Dispo: 1 year follow-up with Dr. Barbaraann at her request, will route message to both doctors  to ensure this is okay.  Signed, Thom LITTIE Sluder, PA-C  "

## 2024-12-31 ENCOUNTER — Encounter: Payer: Self-pay | Admitting: Cardiology

## 2024-12-31 ENCOUNTER — Ambulatory Visit: Attending: Cardiology | Admitting: Cardiology

## 2024-12-31 VITALS — BP 114/76 | HR 67 | Ht 66.0 in | Wt 176.0 lb

## 2024-12-31 DIAGNOSIS — I2510 Atherosclerotic heart disease of native coronary artery without angina pectoris: Secondary | ICD-10-CM

## 2024-12-31 DIAGNOSIS — I10 Essential (primary) hypertension: Secondary | ICD-10-CM

## 2024-12-31 DIAGNOSIS — Z01810 Encounter for preprocedural cardiovascular examination: Secondary | ICD-10-CM | POA: Diagnosis not present

## 2024-12-31 DIAGNOSIS — I77810 Thoracic aortic ectasia: Secondary | ICD-10-CM

## 2024-12-31 NOTE — Patient Instructions (Signed)
 Medication Instructions:  Your physician recommends that you continue on your current medications as directed. Please refer to the Current Medication list given to you today.  *If you need a refill on your cardiac medications before your next appointment, please call your pharmacy*  Lab Work: None  If you have labs (blood work) drawn today and your tests are completely normal, you will receive your results only by: MyChart Message (if you have MyChart) OR A paper copy in the mail If you have any lab test that is abnormal or we need to change your treatment, we will call you to review the results.  Testing/Procedures: None   Follow-Up: At [facility], you and your health needs are our priority.  As part of our continuing mission to provide you with exceptional heart care, our providers are all part of one team.  This team includes your primary Cardiologist (physician) and Advanced Practice Providers or APPs (Physician Assistants and Nurse Practitioners) who all work together to provide you with the care you need, when you need it.  Your next appointment:   1 year(s)  Provider:   Darryle ONEIDA Decent, MD    Other Instructions None

## 2025-01-01 ENCOUNTER — Ambulatory Visit (INDEPENDENT_AMBULATORY_CARE_PROVIDER_SITE_OTHER): Admitting: Otolaryngology

## 2025-01-02 ENCOUNTER — Telehealth: Payer: Self-pay

## 2025-01-02 NOTE — Telephone Encounter (Signed)
 Called and spoke with patient-patient advised of colon recall due;  patient reports she is having hand surgery soon and is requesting the office to call her back in about 2 months and she will see about scheduling at that time; Patient advised to call back to the office at [phone] should questions/concerns arise;  Patient verbalized understanding of information/instructions;   Recall updated to reflect this information;

## 2025-01-17 ENCOUNTER — Ambulatory Visit (INDEPENDENT_AMBULATORY_CARE_PROVIDER_SITE_OTHER): Admitting: Otolaryngology

## 2025-02-28 ENCOUNTER — Ambulatory Visit (INDEPENDENT_AMBULATORY_CARE_PROVIDER_SITE_OTHER): Admitting: Otolaryngology

## 2025-03-14 ENCOUNTER — Ambulatory Visit (INDEPENDENT_AMBULATORY_CARE_PROVIDER_SITE_OTHER): Admitting: Otolaryngology

## 2025-04-29 ENCOUNTER — Ambulatory Visit: Admitting: Internal Medicine

## 2025-07-18 ENCOUNTER — Ambulatory Visit: Admitting: Adult Health
